# Patient Record
Sex: Female | Born: 1975 | Race: Black or African American | Hispanic: No | Marital: Married | Smoking: Never smoker
Health system: Southern US, Community
[De-identification: ages and names within clinical notes are randomized; demographics above are authoritative.]

## PROBLEM LIST (undated history)

## (undated) ENCOUNTER — Inpatient Hospital Stay (HOSPITAL_COMMUNITY): Payer: Self-pay

## (undated) DIAGNOSIS — D219 Benign neoplasm of connective and other soft tissue, unspecified: Secondary | ICD-10-CM

## (undated) DIAGNOSIS — R112 Nausea with vomiting, unspecified: Secondary | ICD-10-CM

## (undated) DIAGNOSIS — Z8781 Personal history of (healed) traumatic fracture: Secondary | ICD-10-CM

## (undated) DIAGNOSIS — R51 Headache: Secondary | ICD-10-CM

## (undated) DIAGNOSIS — C801 Malignant (primary) neoplasm, unspecified: Secondary | ICD-10-CM

## (undated) DIAGNOSIS — K649 Unspecified hemorrhoids: Secondary | ICD-10-CM

## (undated) DIAGNOSIS — Z9889 Other specified postprocedural states: Secondary | ICD-10-CM

## (undated) DIAGNOSIS — R519 Headache, unspecified: Secondary | ICD-10-CM

## (undated) DIAGNOSIS — J302 Other seasonal allergic rhinitis: Secondary | ICD-10-CM

## (undated) DIAGNOSIS — D649 Anemia, unspecified: Secondary | ICD-10-CM

## (undated) DIAGNOSIS — E559 Vitamin D deficiency, unspecified: Secondary | ICD-10-CM

## (undated) DIAGNOSIS — M719 Bursopathy, unspecified: Secondary | ICD-10-CM

## (undated) HISTORY — PX: MANDIBLE SURGERY: SHX707

## (undated) HISTORY — DX: Benign neoplasm of connective and other soft tissue, unspecified: D21.9

## (undated) HISTORY — DX: Unspecified hemorrhoids: K64.9

## (undated) HISTORY — DX: Other specified postprocedural states: Z98.890

## (undated) HISTORY — DX: Personal history of (healed) traumatic fracture: Z87.81

## (undated) HISTORY — PX: WRIST SURGERY: SHX841

## (undated) HISTORY — PX: ECTOPIC PREGNANCY SURGERY: SHX613

## (undated) HISTORY — PX: FRACTURE SURGERY: SHX138

## (undated) HISTORY — DX: Vitamin D deficiency, unspecified: E55.9

## (undated) HISTORY — DX: Other seasonal allergic rhinitis: J30.2

## (undated) HISTORY — PX: NASAL SINUS SURGERY: SHX719

---

## 2000-10-16 ENCOUNTER — Other Ambulatory Visit: Admission: RE | Admit: 2000-10-16 | Discharge: 2000-10-16 | Payer: Self-pay | Admitting: Obstetrics and Gynecology

## 2001-12-14 ENCOUNTER — Emergency Department (HOSPITAL_COMMUNITY): Admission: EM | Admit: 2001-12-14 | Discharge: 2001-12-14 | Payer: Self-pay | Admitting: Emergency Medicine

## 2003-03-08 ENCOUNTER — Ambulatory Visit (HOSPITAL_COMMUNITY): Admission: RE | Admit: 2003-03-08 | Discharge: 2003-03-08 | Payer: Self-pay | Admitting: Pulmonary Disease

## 2004-06-15 ENCOUNTER — Ambulatory Visit (HOSPITAL_COMMUNITY): Admission: RE | Admit: 2004-06-15 | Discharge: 2004-06-15 | Payer: Self-pay | Admitting: Pulmonary Disease

## 2004-10-16 ENCOUNTER — Ambulatory Visit: Payer: Self-pay | Admitting: Orthopedic Surgery

## 2007-03-03 ENCOUNTER — Other Ambulatory Visit: Admission: RE | Admit: 2007-03-03 | Discharge: 2007-03-03 | Payer: Self-pay | Admitting: Obstetrics and Gynecology

## 2008-04-05 ENCOUNTER — Other Ambulatory Visit: Admission: RE | Admit: 2008-04-05 | Discharge: 2008-04-05 | Payer: Self-pay | Admitting: Obstetrics and Gynecology

## 2008-04-14 ENCOUNTER — Ambulatory Visit (HOSPITAL_COMMUNITY): Admission: RE | Admit: 2008-04-14 | Discharge: 2008-04-14 | Payer: Self-pay | Admitting: Obstetrics & Gynecology

## 2009-06-06 ENCOUNTER — Other Ambulatory Visit
Admission: RE | Admit: 2009-06-06 | Discharge: 2009-06-06 | Payer: Self-pay | Source: Home / Self Care | Admitting: Obstetrics and Gynecology

## 2010-02-28 ENCOUNTER — Ambulatory Visit (HOSPITAL_COMMUNITY)
Admission: RE | Admit: 2010-02-28 | Discharge: 2010-02-28 | Payer: Self-pay | Source: Home / Self Care | Attending: Pulmonary Disease | Admitting: Pulmonary Disease

## 2010-04-19 ENCOUNTER — Encounter: Payer: Self-pay | Admitting: Orthopedic Surgery

## 2010-05-08 ENCOUNTER — Encounter: Payer: Self-pay | Admitting: Orthopedic Surgery

## 2010-05-10 ENCOUNTER — Ambulatory Visit: Payer: Self-pay | Admitting: Orthopedic Surgery

## 2010-06-14 ENCOUNTER — Ambulatory Visit: Payer: Self-pay | Admitting: Orthopedic Surgery

## 2010-06-27 ENCOUNTER — Encounter: Payer: Self-pay | Admitting: Orthopedic Surgery

## 2010-06-27 ENCOUNTER — Ambulatory Visit (INDEPENDENT_AMBULATORY_CARE_PROVIDER_SITE_OTHER): Payer: BC Managed Care – PPO | Admitting: Orthopedic Surgery

## 2010-06-27 VITALS — HR 72 | Resp 18 | Ht 63.0 in | Wt 164.0 lb

## 2010-06-27 DIAGNOSIS — M25519 Pain in unspecified shoulder: Secondary | ICD-10-CM

## 2010-06-27 DIAGNOSIS — M25511 Pain in right shoulder: Secondary | ICD-10-CM

## 2010-06-27 MED ORDER — IBUPROFEN 800 MG PO TABS: 800.0000 mg | ORAL_TABLET | Freq: Three times a day (TID) | ORAL | Status: AC | PRN

## 2010-06-27 NOTE — Progress Notes (Signed)
Bilateral shoulder pain.  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 Presents now with bilateral aching in the shoulders from time to time with sharp throbbing pain, which seems to be intermittent. Occasionally, associated with some numbness and tingling in the RIGHT upper extremity. No injuries reported,  There's no locking, catching noted. Seems to alternate between the RIGHT and LEFT shoulder. The pain is not related to the neck. The neck does not hurt when she is turning her flexing the neck. She does have pain when she moves the shoulder joint.  Medical systems review history weight gain, chest pain, cough, fatigue snoring, numbness, tingling, anxiety. All the systems reviewed were negative.  Social history is recorded again. She is a Runner, broadcasting/film/video. She doesn't smoke or drink. She does have a master's degree.  Her vital signs are as recorded  General: The patient is normally developed, with normal grooming and hygiene. There are no gross deformities. The body habitus is normal   CDV: The pulse and perfusion of the extremities are normal   LYMPH: There is no gross lymphadenopathy in the extremities   Skin: There are no rashes, ulcers or cafe-au-lait spot   Psyche: The patient is alert, awake and oriented.  Mood is normal   Neuro:  The coordination and balance are normal.  Sensation is normal. Reflexes are 2+ and equal   Musculoskeletal  Bilateral shoulders:   There appears to be no palpable tenderness or swelling along the shoulders. She does have full range of motion. Her shoulders are stable. Strength is normal and the rotator cuff. She has no instability.  Impingement signs are positive bilaterally. 150.  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  Recommend a home exercise program and ibuprofen.  X-rays of the cervical spine, complete series and  RIGHT shoulder were negative. They were done at the hospital.  Follow up as needed

## 2010-06-27 NOTE — Patient Instructions (Signed)
Perform exercises x 6 weeks

## 2011-01-15 DIAGNOSIS — C089 Malignant neoplasm of major salivary gland, unspecified: Secondary | ICD-10-CM

## 2011-01-15 HISTORY — DX: Malignant neoplasm of major salivary gland, unspecified: C08.9

## 2011-06-07 ENCOUNTER — Ambulatory Visit (HOSPITAL_COMMUNITY)
Admission: RE | Admit: 2011-06-07 | Discharge: 2011-06-07 | Disposition: A | Discharge status: Home / Self Care | Payer: BC Managed Care – PPO | Source: Ambulatory Visit | Attending: Pulmonary Disease | Admitting: Pulmonary Disease

## 2011-06-07 ENCOUNTER — Other Ambulatory Visit (HOSPITAL_COMMUNITY): Payer: Self-pay | Admitting: Pulmonary Disease

## 2011-06-07 DIAGNOSIS — R059 Cough, unspecified: Secondary | ICD-10-CM

## 2011-06-07 DIAGNOSIS — R05 Cough: Secondary | ICD-10-CM

## 2011-06-07 DIAGNOSIS — J9819 Other pulmonary collapse: Secondary | ICD-10-CM | POA: Insufficient documentation

## 2011-06-07 DIAGNOSIS — J90 Pleural effusion, not elsewhere classified: Secondary | ICD-10-CM | POA: Insufficient documentation

## 2011-06-07 DIAGNOSIS — I517 Cardiomegaly: Secondary | ICD-10-CM | POA: Insufficient documentation

## 2011-06-07 DIAGNOSIS — R0602 Shortness of breath: Secondary | ICD-10-CM | POA: Insufficient documentation

## 2011-06-26 ENCOUNTER — Ambulatory Visit (HOSPITAL_COMMUNITY)
Admission: RE | Admit: 2011-06-26 | Discharge: 2011-06-26 | Disposition: A | Discharge status: Home / Self Care | Payer: BC Managed Care – PPO | Source: Ambulatory Visit | Attending: Pulmonary Disease | Admitting: Pulmonary Disease

## 2011-06-26 ENCOUNTER — Other Ambulatory Visit (HOSPITAL_COMMUNITY): Payer: Self-pay | Admitting: Pulmonary Disease

## 2011-06-26 DIAGNOSIS — M25469 Effusion, unspecified knee: Secondary | ICD-10-CM | POA: Insufficient documentation

## 2011-06-26 DIAGNOSIS — J189 Pneumonia, unspecified organism: Secondary | ICD-10-CM

## 2011-10-02 ENCOUNTER — Other Ambulatory Visit: Payer: Self-pay | Admitting: Adult Health

## 2011-10-02 ENCOUNTER — Other Ambulatory Visit (HOSPITAL_COMMUNITY)
Admission: RE | Admit: 2011-10-02 | Discharge: 2011-10-02 | Disposition: A | Discharge status: Home / Self Care | Payer: BC Managed Care – PPO | Source: Ambulatory Visit | Attending: Obstetrics and Gynecology | Admitting: Obstetrics and Gynecology

## 2011-10-02 DIAGNOSIS — Z113 Encounter for screening for infections with a predominantly sexual mode of transmission: Secondary | ICD-10-CM | POA: Insufficient documentation

## 2011-10-02 DIAGNOSIS — Z01419 Encounter for gynecological examination (general) (routine) without abnormal findings: Secondary | ICD-10-CM | POA: Insufficient documentation

## 2011-10-02 DIAGNOSIS — Z1151 Encounter for screening for human papillomavirus (HPV): Secondary | ICD-10-CM | POA: Insufficient documentation

## 2011-10-02 LAB — OB RESULTS CONSOLE HEPATITIS B SURFACE ANTIGEN: Hepatitis B Surface Ag: NEGATIVE

## 2011-10-02 LAB — OB RESULTS CONSOLE ANTIBODY SCREEN: Antibody Screen: NEGATIVE

## 2011-10-02 LAB — OB RESULTS CONSOLE RPR: RPR: NONREACTIVE

## 2011-10-02 LAB — OB RESULTS CONSOLE HIV ANTIBODY (ROUTINE TESTING): HIV: NONREACTIVE

## 2011-10-02 LAB — OB RESULTS CONSOLE RUBELLA ANTIBODY, IGM: Rubella: IMMUNE

## 2011-10-13 ENCOUNTER — Inpatient Hospital Stay (HOSPITAL_COMMUNITY)
Admission: AD | Admit: 2011-10-13 | Discharge: 2011-10-13 | Disposition: A | Discharge status: Home / Self Care | Payer: BC Managed Care – PPO | Source: Ambulatory Visit | Attending: Family Medicine | Admitting: Family Medicine

## 2011-10-13 ENCOUNTER — Encounter (HOSPITAL_COMMUNITY): Payer: Self-pay | Admitting: *Deleted

## 2011-10-13 DIAGNOSIS — O209 Hemorrhage in early pregnancy, unspecified: Secondary | ICD-10-CM | POA: Insufficient documentation

## 2011-10-13 DIAGNOSIS — A599 Trichomoniasis, unspecified: Secondary | ICD-10-CM

## 2011-10-13 DIAGNOSIS — O98819 Other maternal infectious and parasitic diseases complicating pregnancy, unspecified trimester: Secondary | ICD-10-CM | POA: Insufficient documentation

## 2011-10-13 DIAGNOSIS — A5901 Trichomonal vulvovaginitis: Secondary | ICD-10-CM | POA: Insufficient documentation

## 2011-10-13 DIAGNOSIS — O4690 Antepartum hemorrhage, unspecified, unspecified trimester: Secondary | ICD-10-CM

## 2011-10-13 HISTORY — DX: Other specified postprocedural states: Z98.890

## 2011-10-13 HISTORY — DX: Other specified postprocedural states: R11.2

## 2011-10-13 LAB — ABO/RH: ABO/RH(D): O POS

## 2011-10-13 NOTE — MAU Note (Signed)
Pt reports having some vaginal bleeding this morning. Denies any pain or cramping at this time.  U/S in office this week. Pregnancy with Twins

## 2011-10-13 NOTE — MAU Provider Note (Signed)
History     CSN: [id number]  Arrival date and time: 10/13/11 1317   First Provider Initiated Contact with Patient 10/13/11 1349      Chief Complaint  Patient presents with  . Vaginal Bleeding   HPI Kara Gentry is a 36 y.o. female @ [redacted] weeks gestation with twins who presents to MAU with vaginal bleeding. The bleeding started this morning approximately 7:30 am. She describes the bleeding as lighter than a period and bright red. Has had episodes of spotting earlier in the pernancy but today was more. Denies abdominal cramping. Last sexual intercourse 2 months ago. Last pap smear 2 weeks ago and was normal. Had pap smear and cultures done 2 weeks ago and was given Rx for flagyl for trichomonas but has not gotten medication. Prenatal care with Dr. Emelda Fear. Last office visit was this past week. Had blood work and ultrasound and everything was fine.   OB History    Grav Para Term Preterm Abortions TAB SAB Ect Mult Living   3 0   2  2   0      Past Medical History  Diagnosis Date  . Seasonal allergies   . No pertinent past medical history   . PONV (postoperative nausea and vomiting)     Past Surgical History  Procedure Date  . Ectopic pregnancy surgery   . Wrist surgery   . Mandible surgery cancer  . Nasal sinus surgery     Family History  Problem Relation Age of Onset  . Arthritis    . Cancer    . Diabetes      History  Substance Use Topics  . Smoking status: Never Smoker   . Smokeless tobacco: Not on file  . Alcohol Use: No    Allergies: No Known Allergies  Prescriptions prior to admission  Medication Sig Dispense Refill  . ibuprofen (ADVIL,MOTRIN) 200 MG tablet Take 200 mg by mouth as needed.        Marland Kitchen VITAMIN D, CHOLECALCIFEROL, PO Take by mouth.          Review of Systems  Constitutional: Negative for fever, chills and weight loss.  HENT: Negative for ear pain, nosebleeds, congestion, sore throat and neck pain.   Eyes: Negative for blurred vision, double  vision, photophobia and pain.  Respiratory: Negative for cough, shortness of breath and wheezing.   Cardiovascular: Negative for chest pain, palpitations and leg swelling.  Gastrointestinal: Positive for heartburn and nausea. Negative for vomiting, abdominal pain, diarrhea and constipation.  Genitourinary: Positive for frequency. Negative for dysuria and urgency.  Musculoskeletal: Negative for myalgias and back pain.  Skin: Negative for itching and rash.  Neurological: Positive for headaches. Negative for dizziness, sensory change, speech change, seizures and weakness.  Endo/Heme/Allergies: Does not bruise/bleed easily.  Psychiatric/Behavioral: Negative for depression. The patient is not nervous/anxious.    Physical Exam   Blood pressure 105/66, pulse 76, temperature 98 F (36.7 C), temperature source Oral, resp. rate 18, height 5\' 2"  (1.575 m), weight 160 lb 3.2 oz (72.666 kg), last menstrual period 06/17/2011.  Physical Exam  Nursing note and vitals reviewed. Constitutional: She is oriented to person, place, and time. She appears well-developed and well-nourished. No distress.  HENT:  Head: Normocephalic and atraumatic.  Eyes: EOM are normal.  Neck: Neck supple.  Cardiovascular: Normal rate.   Respiratory: Effort normal.  GI: Soft. There is no tenderness.  Genitourinary:       External genitalia without lesions. Frothy bloody discharge vaginal vault.  Cervix long, closed, inflamed, no CMT. Uterus approximately 14 week size.  Musculoskeletal: Normal range of motion.  Neurological: She is alert and oriented to person, place, and time.  Skin: Skin is warm and dry.  Psychiatric: She has a normal mood and affect. Her behavior is normal. Judgment and thought content normal.    MAU Course: discussed with Dr. Shawnie Pons  Procedures: informal bedside ultrasound, patient and husband able to visualize both babies and heart beats.   Assessment: Bleeding in pregnancy   Trichomonas from previous  office visit that patient has not taken medication yet  Plan:  Encouraged patient to take the antibiotic   Discussed bleeding in pregnancy, infection and other causes   Return to the office for follow up   Return here as needed  Kari, Montero  Home Medication Instructions [id number]   Printed on:10/13/11 1420  Medication Information                    Prenatal Vit-Fe Fumarate-FA (PRENATAL MULTIVITAMIN) TABS Take 1 tablet by mouth every morning.           ondansetron (ZOFRAN-ODT) 8 MG disintegrating tablet Take 8 mg by mouth every 8 (eight) hours as needed. Takes for nausea           acetaminophen (TYLENOL) 500 MG tablet Take 500 mg by mouth every 6 (six) hours as needed. Takes for headache            Follow-up Information    Follow up with Tilda Burrow, MD.   Contact information:   [facility] [address] [place] [address] [phone]         Results for orders placed during the hospital encounter of 10/13/11 (from the past 24 hour(s))  ABO/RH     Status: Normal   Collection Time   10/13/11  2:27 PM      Component Value Range   ABO/RH(D) [place] POS     NEESE,HOPE, RN, FNP, BC 10/13/2011, 1:50 PM

## 2011-10-14 NOTE — MAU Provider Note (Signed)
Chart reviewed and agree with management and plan.  

## 2011-11-27 ENCOUNTER — Other Ambulatory Visit: Payer: Self-pay

## 2011-12-16 ENCOUNTER — Encounter (HOSPITAL_COMMUNITY): Payer: Self-pay | Admitting: Emergency Medicine

## 2011-12-16 ENCOUNTER — Inpatient Hospital Stay (HOSPITAL_COMMUNITY): Payer: BC Managed Care – PPO

## 2011-12-16 ENCOUNTER — Emergency Department (HOSPITAL_COMMUNITY)
Admission: EM | Admit: 2011-12-16 | Discharge: 2011-12-16 | Disposition: A | Discharge status: Home / Self Care | Payer: BC Managed Care – PPO | Source: Home / Self Care | Attending: Emergency Medicine | Admitting: Emergency Medicine

## 2011-12-16 ENCOUNTER — Inpatient Hospital Stay (HOSPITAL_COMMUNITY)
Admission: AD | Admit: 2011-12-16 | Discharge: 2011-12-25 | DRG: 651 | Disposition: A | Discharge status: Home / Self Care | Payer: BC Managed Care – PPO | Source: Ambulatory Visit | Attending: Obstetrics & Gynecology | Admitting: Obstetrics & Gynecology

## 2011-12-16 ENCOUNTER — Encounter (HOSPITAL_COMMUNITY): Payer: Self-pay | Admitting: *Deleted

## 2011-12-16 DIAGNOSIS — O4290 Premature rupture of membranes, unspecified as to length of time between rupture and onset of labor, unspecified weeks of gestation: Principal | ICD-10-CM | POA: Diagnosis present

## 2011-12-16 DIAGNOSIS — O3430 Maternal care for cervical incompetence, unspecified trimester: Secondary | ICD-10-CM | POA: Diagnosis present

## 2011-12-16 DIAGNOSIS — Z98891 History of uterine scar from previous surgery: Secondary | ICD-10-CM

## 2011-12-16 DIAGNOSIS — J301 Allergic rhinitis due to pollen: Secondary | ICD-10-CM | POA: Insufficient documentation

## 2011-12-16 DIAGNOSIS — R109 Unspecified abdominal pain: Secondary | ICD-10-CM | POA: Insufficient documentation

## 2011-12-16 DIAGNOSIS — O30049 Twin pregnancy, dichorionic/diamniotic, unspecified trimester: Secondary | ICD-10-CM

## 2011-12-16 DIAGNOSIS — O30009 Twin pregnancy, unspecified number of placenta and unspecified number of amniotic sacs, unspecified trimester: Secondary | ICD-10-CM

## 2011-12-16 DIAGNOSIS — Z331 Pregnant state, incidental: Secondary | ICD-10-CM | POA: Insufficient documentation

## 2011-12-16 DIAGNOSIS — O09529 Supervision of elderly multigravida, unspecified trimester: Secondary | ICD-10-CM | POA: Diagnosis present

## 2011-12-16 DIAGNOSIS — N719 Inflammatory disease of uterus, unspecified: Secondary | ICD-10-CM

## 2011-12-16 DIAGNOSIS — N883 Incompetence of cervix uteri: Secondary | ICD-10-CM

## 2011-12-16 DIAGNOSIS — IMO0001 Reserved for inherently not codable concepts without codable children: Secondary | ICD-10-CM

## 2011-12-16 DIAGNOSIS — IMO0002 Reserved for concepts with insufficient information to code with codable children: Secondary | ICD-10-CM

## 2011-12-16 DIAGNOSIS — Z3490 Encounter for supervision of normal pregnancy, unspecified, unspecified trimester: Secondary | ICD-10-CM

## 2011-12-16 LAB — URINALYSIS, ROUTINE W REFLEX MICROSCOPIC
Bilirubin Urine: NEGATIVE
Bilirubin Urine: NEGATIVE
Glucose, UA: NEGATIVE mg/dL
Glucose, UA: NEGATIVE mg/dL
Hgb urine dipstick: NEGATIVE
Ketones, ur: NEGATIVE mg/dL
Ketones, ur: 15 mg/dL — AB
Leukocytes, UA: NEGATIVE
Leukocytes, UA: NEGATIVE
Nitrite: NEGATIVE
Nitrite: NEGATIVE
Protein, ur: NEGATIVE mg/dL
Protein, ur: NEGATIVE mg/dL
Specific Gravity, Urine: <1.005 — ABNORMAL LOW (ref 1.005–1.030)
Specific Gravity, Urine: 1.01 (ref 1.005–1.030)
Urobilinogen, UA: 0.2 mg/dL (ref 0.0–1.0)
Urobilinogen, UA: 0.2 mg/dL (ref 0.0–1.0)
pH: 6 (ref 5.0–8.0)
pH: 7 (ref 5.0–8.0)

## 2011-12-16 LAB — CBC WITH DIFFERENTIAL/PLATELET
Basophils Absolute: 0 10*3/uL (ref 0.0–0.1)
Basophils Relative: 0 % (ref 0–1)
Eosinophils Absolute: 0 10*3/uL (ref 0.0–0.7)
Eosinophils Relative: 0 % (ref 0–5)
HCT: 32.2 % — ABNORMAL LOW (ref 36.0–46.0)
Hemoglobin: 10.8 g/dL — ABNORMAL LOW (ref 12.0–15.0)
Lymphocytes Relative: 12 % (ref 12–46)
Lymphs Abs: 1.4 10*3/uL (ref 0.7–4.0)
MCH: 28.6 pg (ref 26.0–34.0)
MCHC: 33.5 g/dL (ref 30.0–36.0)
MCV: 85.4 fL (ref 78.0–100.0)
Monocytes Absolute: 0.8 10*3/uL (ref 0.1–1.0)
Monocytes Relative: 7 % (ref 3–12)
Neutro Abs: 9.1 10*3/uL — ABNORMAL HIGH (ref 1.7–7.7)
Neutrophils Relative %: 80 % — ABNORMAL HIGH (ref 43–77)
Platelets: 254 10*3/uL (ref 150–400)
RBC: 3.77 MIL/uL — ABNORMAL LOW (ref 3.87–5.11)
RDW: 14.8 % (ref 11.5–15.5)
WBC: 11.3 10*3/uL — ABNORMAL HIGH (ref 4.0–10.5)

## 2011-12-16 LAB — WET PREP, GENITAL
Clue Cells Wet Prep HPF POC: NONE SEEN
Trich, Wet Prep: NONE SEEN
Yeast Wet Prep HPF POC: NONE SEEN

## 2011-12-16 LAB — URINE MICROSCOPIC-ADD ON

## 2011-12-16 MED ORDER — CALCIUM CARBONATE ANTACID 500 MG PO CHEW
2.0000 | CHEWABLE_TABLET | ORAL | Status: DC | PRN
  Filled 2011-12-16: qty 2

## 2011-12-16 MED ORDER — ZOLPIDEM TARTRATE 5 MG PO TABS
5.0000 mg | ORAL_TABLET | Freq: Every evening | ORAL | Status: DC | PRN
  Administered 2011-12-17 – 2011-12-22 (×6): 5 mg via ORAL
  Filled 2011-12-16 (×6): qty 1

## 2011-12-16 MED ORDER — SODIUM CHLORIDE 0.9 % IV SOLN
250.0000 mg | Freq: Four times a day (QID) | INTRAVENOUS | Status: AC
  Administered 2011-12-16 – 2011-12-18 (×8): 250 mg via INTRAVENOUS
  Filled 2011-12-16 (×8): qty 250

## 2011-12-16 MED ORDER — AMOXICILLIN 500 MG PO CAPS
500.0000 mg | ORAL_CAPSULE | Freq: Three times a day (TID) | ORAL | Status: DC
  Administered 2011-12-18 – 2011-12-22 (×11): 500 mg via ORAL
  Filled 2011-12-16 (×12): qty 1

## 2011-12-16 MED ORDER — PRENATAL MULTIVITAMIN CH
1.0000 | ORAL_TABLET | Freq: Every day | ORAL | Status: DC
  Administered 2011-12-17 – 2011-12-21 (×5): 1 via ORAL
  Filled 2011-12-16 (×9): qty 1

## 2011-12-16 MED ORDER — SODIUM CHLORIDE 0.9 % IV SOLN
2.0000 g | Freq: Four times a day (QID) | INTRAVENOUS | Status: AC
  Administered 2011-12-16 – 2011-12-18 (×8): 2 g via INTRAVENOUS
  Filled 2011-12-16 (×8): qty 2000

## 2011-12-16 MED ORDER — ACETAMINOPHEN 325 MG PO TABS: 650.0000 mg | ORAL_TABLET | ORAL | Status: DC | PRN

## 2011-12-16 MED ORDER — DEXTROSE IN LACTATED RINGERS 5 % IV SOLN
INTRAVENOUS | Status: DC
  Administered 2011-12-16 – 2011-12-21 (×6): via INTRAVENOUS

## 2011-12-16 MED ORDER — DOCUSATE SODIUM 100 MG PO CAPS
100.0000 mg | ORAL_CAPSULE | Freq: Every day | ORAL | Status: DC
  Administered 2011-12-17: 100 mg via ORAL
  Filled 2011-12-16 (×4): qty 1

## 2011-12-16 MED ORDER — ERYTHROMYCIN BASE 250 MG PO TABS
250.0000 mg | ORAL_TABLET | Freq: Four times a day (QID) | ORAL | Status: DC
  Administered 2011-12-18 – 2011-12-22 (×15): 250 mg via ORAL
  Filled 2011-12-16 (×16): qty 1

## 2011-12-16 NOTE — MAU Note (Signed)
Pt woke up this morning and felt something in her vagina.  She then went to [facility], who saw nothing upon exam, and discharged her.  After returning home, pt went to use restroom and again felt something coming out of her vagina.  Her husband could see the protruding object and took a picture and then brought her to MAU.  Pt has been having some pink spotting as well today.

## 2011-12-16 NOTE — Progress Notes (Signed)
Patient states she is feeling flutters in her vagina. No additional bleeding noted. No leaking of fluid. JML

## 2011-12-16 NOTE — Progress Notes (Signed)
Spoke with Reuel Boom at [facility] ED, pt in no pain at this time, no discharge or leaking of fluid noted.  No bleeding.  Pt reports good fetal movement. FHR #1 left lower abd, 150s, FHR #2 near umbilicus, 140s. Roney Marion, CNM states ok to discharge patient.

## 2011-12-16 NOTE — ED Provider Notes (Addendum)
History     CSN: [id number]  Arrival date & time 12/16/11  0125   First MD Initiated Contact with Patient 12/16/11 0129      Chief Complaint  Patient presents with  . Abdominal Pain     Patient is a 36 y.o. female presenting with abdominal pain. The history is provided by the patient.  Abdominal Pain The primary symptoms of the illness include abdominal pain and vaginal discharge. The primary symptoms of the illness do not include fever, nausea, vomiting, diarrhea, dysuria or vaginal bleeding. Episode onset: just prior to arrival. The onset of the illness was gradual. The problem has been gradually worsening.  The vaginal discharge is not associated with dysuria.  Symptoms associated with the illness do not include chills.  Pt reports she was having a bowel movement and felt pain in her lower abdomen and saw a "bubble pop out" of her vagina which improved immediately.  She reports her pain continues.  She reports intermittent vag discharge all day today.  No vag bleeding.  No contractions.  No back pain.  No falls or trauma reported  She is pregnant with twins.  She is 22 weeks 1 day She has not had any complications thus far  Past Medical History  Diagnosis Date  . Seasonal allergies   . No pertinent past medical history   . PONV (postoperative nausea and vomiting)     Past Surgical History  Procedure Date  . Ectopic pregnancy surgery   . Wrist surgery   . Mandible surgery cancer  . Nasal sinus surgery     Family History  Problem Relation Age of Onset  . Arthritis    . Cancer    . Diabetes      History  Substance Use Topics  . Smoking status: Never Smoker   . Smokeless tobacco: Not on file  . Alcohol Use: No    OB History    Grav Para Term Preterm Abortions TAB SAB Ect Mult Living   3 0   2  2   0      Review of Systems  Constitutional: Negative for fever and chills.  Gastrointestinal: Positive for abdominal pain. Negative for nausea, vomiting and  diarrhea.  Genitourinary: Positive for vaginal discharge. Negative for dysuria and vaginal bleeding.  All other systems reviewed and are negative.    Allergies  Review of patient's allergies indicates no known allergies.  Home Medications   Current Outpatient Rx  Name Route Sig Dispense Refill  . ACETAMINOPHEN 500 MG PO TABS Oral Take 500 mg by mouth every 6 (six) hours as needed. Takes for headache    . ONDANSETRON 8 MG PO TBDP Oral Take 8 mg by mouth every 8 (eight) hours as needed. Takes for nausea    . PRENATAL MULTIVITAMIN CH Oral Take 1 tablet by mouth every morning.      BP 129/81  Pulse 92  Temp 98.8 F (37.1 C) (Oral)  Resp 16  Ht 5\' 2"  (1.575 m)  Wt 160 lb (72.576 kg)  BMI 29.26 kg/m2  SpO2 99%  LMP 06/17/2011  Physical Exam CONSTITUTIONAL: Well developed/well nourished HEAD AND FACE: Normocephalic/atraumatic EYES: EOMI/PERRL ENMT: Mucous membranes moist NECK: supple no meningeal signs SPINE:entire spine nontender CV: S1/S2 noted, no murmurs/rubs/gallops noted LUNGS: Lungs are clear to auscultation bilaterally, no apparent distress ABDOMEN: soft, nontender, no rebound or guarding. gravid GU:no cva tenderness.  Sterile glove exam.  No vag bleeding. Small amt of discharge. Cervix is not definitively  identified.  No products of conception are noted. No cystocele noted  Chaperone present NEURO: Pt is awake/alert, moves all extremitiesx4 EXTREMITIES: pulses normal, full ROM SKIN: warm, color normal PSYCH: no abnormalities of mood noted    ED Course  Procedures 1:54 AM Pt here with mild abdominal pain and "saw a bubble" in her vagina after a bowel movement.  Fetal monitoring in progress and will call OB for further guidance 2:11 AM [facility]  We discussed her exam findings.  We discussed her history and we discussed that she is pregnant with twins at 22 weeks.  He feels she does not need transfer/monitoring at this time. He feels  she is  safe for discharge.  Pt is well appearing, no distress.  Will send urine for culture (pt denies dysuria, no fever/vomiting) will not empirically treat I doubt other acute abdominal process at this time as she is well appearing, no distress/nontoxic  MDM  Nursing notes including past medical history and social history reviewed and considered in documentation Labs/vital reviewed and considered         Joya Gaskins, MD 12/16/11 0245  Joya Gaskins, MD 12/16/11 [phone]

## 2011-12-16 NOTE — ED Notes (Signed)
Pt discharged. Pt stable at time of discharge. pt has no questions regarding discharge at this time. Pt voiced understanding of discharge instructions.  

## 2011-12-16 NOTE — Consult Note (Signed)
Asked by Dr. Macon Large to provide prenatal consultation for 36 yo G3 P0 mother with twins who has probable incompetent cervix, admitted today at 3 1/7 wks after presenting with bulging BOW, 4 cm dilated per [place]. Pregnancy prevously uncomplicated (other than multiple gestation).  She is in Trendelenberg position and has been started on ampicillin.  Presented limit of viability as [redacted] wks EGA, with no reasonable expectation for survival prior to that due to multisystem immaturity, skin immaturity, and size, while after 23 wks there would be some small chance for survival without major handicap. I emphasized that intervention < 24 wks was not always appropriate and that the decision to intervene could be based on parents' wishes, preferably with their understanding the potential adverse outcomes at this stage of borderline viability.  I suggested that she and FOB consider at what EGA they would want full-scale fetal intervention, and to discuss this with OB.    Suggest beginning BMZ 2 days prior to date at which intervention (possible C/section for fetal indications, NICU attendance at delivery) is desired.  Patient was attentive, had few questions, and was appreciative of my input.  Thank you for the consultation.  Add:  Suggest repeat NICU consult if pregnancy continues to viability.

## 2011-12-16 NOTE — MAU Note (Signed)
Pt reports she saw a sac/bubble protruding from her vagina. Went to [facility] and they did not see it told her to go home and rest.  Happened again this afternoon and come to MAU. NO obvious sign of bulging membranes. F. Cres-Dishmon,CNM at bedside. U/S ordered no speculum or vag exam done . Pt placed in trendelenburg.

## 2011-12-16 NOTE — ED Notes (Signed)
Patient states she is [redacted] weeks pregnant with twins and went to the bathroom approximately 15 minutes ago and had a bowel movement. Patient states she "felt like a bubble came out of vagina". Also complains of clear white discharge. Now complaining of lower abdominal pain.

## 2011-12-16 NOTE — H&P (Signed)
Kara Gentry is a 36 y.o. G3P0020 at [redacted]w[redacted]d admitted for probably incompetent cervix.   0 Fetal presentation is cephalic.baby A; Baby B transverse  History of Present Illness: Pt was seen at [facility] last night with c/o "bubble" coming out of vagina.  Please see notes from that visit for details Patient reports the fetal movement as active. Patient reports uterine contraction  activity as "Crampy" Patient reports  vaginal bleeding as scant staining. Patient describes fluid per vagina as None.  There is no problem list on file for this patient.  Past Medical History: Past Medical History  Diagnosis Date  . Seasonal allergies   . No pertinent past medical history   . PONV (postoperative nausea and vomiting)     Past Surgical History: Past Surgical History  Procedure Date  . Ectopic pregnancy surgery   . Wrist surgery   . Mandible surgery cancer  . Nasal sinus surgery     Obstetrical History: OB History    Grav Para Term Preterm Abortions TAB SAB Ect Mult Living   3 0   2  2   0       Social History: History   Social History  . Marital Status: Married    Spouse Name: N/A    Number of Children: N/A  . Years of Education: masters   Occupational History  . teacher    Social History Main Topics  . Smoking status: Never Smoker   . Smokeless tobacco: None  . Alcohol Use: No  . Drug Use: No  . Sexually Active: Yes    Birth Control/ Protection: None   Other Topics Concern  . None   Social History Narrative  . None    Family History: Family History  Problem Relation Age of Onset  . Arthritis    . Cancer    . Diabetes      Allergies: No Known Allergies  Prescriptions prior to admission  Medication Sig Dispense Refill  . acetaminophen (TYLENOL) 500 MG tablet Take 500 mg by mouth every 6 (six) hours as needed. Takes for headache      . ondansetron (ZOFRAN) 8 MG tablet Take 8 mg by mouth every 8 (eight) hours as needed. For nausea      . Prenatal  Vit-Fe Fumarate-FA (PRENATAL MULTIVITAMIN) TABS Take 1 tablet by mouth every morning.        Review of Systems - Negative except above mentioned.  "bubble" no longer visible.    Vitals:  Blood pressure 102/67, pulse 79, temperature 98.5 F (36.9 C), temperature source Oral, resp. rate 18, last menstrual period 06/17/2011. Physical Examination:  General appearance - alert, well appearing, and in no distress, oriented to person, place, and time and anxious Mental status - alert, oriented to person, place, and time Chest - clear to auscultation, no wheezes, rales or rhonchi, symmetric air entry Heart - normal rate and regular rhythm Abdomen - soft, nontender, nondistended, no masses or organomegaly Pelvic - Deferred:  Pt has a picture taken earlier that clearly depicts membranes visible at introitus Extremities - peripheral pulses normal, no pedal edema, no clubbing or cyanosis Abdomen: gravid and non-tender and fundal height  is size equals dates and consistent with twins Cervix: per u/s no length and 4cms dilated with membranes protruding in vagina.  Umbilical cord of baby A in protruding membranes . Extremities: extremities normal, atraumatic, no cyanosis or edema with DTRs 2+ bilaterally Membranes:intact Fetal Monitoring:+ FCA both fetuses   Labs:  Recent Results (from the past 24 hour(s))  URINALYSIS, ROUTINE W REFLEX MICROSCOPIC   Collection Time   12/16/11  2:05 AM      Component Value Range   Color, Urine STRAW (*) YELLOW   APPearance HAZY (*) CLEAR   Specific Gravity, Urine <1.005 (*) 1.005 - 1.030   pH 6.0  5.0 - 8.0   Glucose, UA NEGATIVE  NEGATIVE mg/dL   Hgb urine dipstick LARGE (*) NEGATIVE   Bilirubin Urine NEGATIVE  NEGATIVE   Ketones, ur NEGATIVE  NEGATIVE mg/dL   Protein, ur NEGATIVE  NEGATIVE mg/dL   Urobilinogen, UA 0.2  0.0 - 1.0 mg/dL   Nitrite NEGATIVE  NEGATIVE   Leukocytes, UA NEGATIVE  NEGATIVE  URINE MICROSCOPIC-ADD ON   Collection Time   12/16/11   2:05 AM      Component Value Range   Squamous Epithelial / LPF MANY (*) RARE   WBC, UA 0-2  <3 WBC/hpf   RBC / HPF 0-2  <3 RBC/hpf   Bacteria, UA FEW (*) RARE  CBC WITH DIFFERENTIAL   Collection Time   12/16/11  5:28 PM      Component Value Range   WBC 11.3 (*) 4.0 - 10.5 K/uL   RBC 3.77 (*) 3.87 - 5.11 MIL/uL   Hemoglobin 10.8 (*) 12.0 - 15.0 g/dL   HCT 46.9 (*) 62.9 - 52.8 %   MCV 85.4  78.0 - 100.0 fL   MCH 28.6  26.0 - 34.0 pg   MCHC 33.5  30.0 - 36.0 g/dL   RDW 41.3  24.4 - 01.0 %   Platelets 254  150 - 400 K/uL   Neutrophils Relative 80 (*) 43 - 77 %   Neutro Abs 9.1 (*) 1.7 - 7.7 K/uL   Lymphocytes Relative 12  12 - 46 %   Lymphs Abs 1.4  0.7 - 4.0 K/uL   Monocytes Relative 7  3 - 12 %   Monocytes Absolute 0.8  0.1 - 1.0 K/uL   Eosinophils Relative 0  0 - 5 %   Eosinophils Absolute 0.0  0.0 - 0.7 K/uL   Basophils Relative 0  0 - 1 %   Basophils Absolute 0.0  0.0 - 0.1 K/uL  WET PREP, GENITAL   Collection Time   12/16/11  5:35 PM      Component Value Range   Yeast Wet Prep HPF POC NONE SEEN  NONE SEEN   Trich, Wet Prep NONE SEEN  NONE SEEN   Clue Cells Wet Prep HPF POC NONE SEEN  NONE SEEN   WBC, Wet Prep HPF POC FEW (*) NONE SEEN    Imaging Studies: Not available at this time for transfer to H&P    . ampicillin (OMNIPEN) IV  2 g Intravenous Q6H   Followed by  . amoxicillin  500 mg Oral Q8H  . docusate sodium  100 mg Oral Daily  . erythromycin  250 mg Intravenous Q6H   Followed by  . erythromycin  250 mg Oral Q6H  . prenatal multivitamin  1 tablet Oral Daily   I have reviewed the patient's current medications.   ASSESSMENT: Probable incompetent cervix:  100%effaced, 4cms; BOW in vagina with umbilical cord presenting Twins Discussed with Dr. Macon Large; will observe, trendelndburg, antibiotics

## 2011-12-16 NOTE — ED Notes (Signed)
Consulting civil engineer at [employer] talking with Consulting civil engineer at [employer]

## 2011-12-16 NOTE — ED Notes (Signed)
Patient heart rate mid to high 70's

## 2011-12-16 NOTE — H&P (Signed)
Attestation of Attending Supervision of Advanced Practitioner (CNM/NP): Evaluation and management procedures were performed by the Advanced Practitioner under my supervision and collaboration.  I have reviewed the Advanced Practitioner's note and chart, and I agree with the management and plan.  UGONNA  ANYANWU, MD, FACOG Attending Obstetrician & Gynecologist Faculty Practice, [facility]  

## 2011-12-17 LAB — FIBRINOGEN: Fibrinogen: 565 mg/dL — ABNORMAL HIGH (ref 204–475)

## 2011-12-17 LAB — URINE CULTURE: Colony Count: 10000

## 2011-12-17 LAB — APTT: aPTT: 30 seconds (ref 24–37)

## 2011-12-17 LAB — PROTIME-INR
INR: 1.03 (ref 0.00–1.49)
Prothrombin Time: 13.4 seconds (ref 11.6–15.2)

## 2011-12-17 LAB — PREPARE RBC (CROSSMATCH)

## 2011-12-17 MED ORDER — DOCUSATE SODIUM 100 MG PO CAPS
100.0000 mg | ORAL_CAPSULE | Freq: Every evening | ORAL | Status: DC | PRN
  Administered 2011-12-18: 100 mg via ORAL
  Filled 2011-12-17: qty 1

## 2011-12-17 MED ORDER — POLYETHYLENE GLYCOL 3350 17 G PO PACK
17.0000 g | PACK | Freq: Every day | ORAL | Status: DC | PRN
  Filled 2011-12-17: qty 1

## 2011-12-17 MED ORDER — OXYCODONE HCL 5 MG PO TABS
5.0000 mg | ORAL_TABLET | ORAL | Status: DC | PRN
  Administered 2011-12-17: 5 mg via ORAL
  Filled 2011-12-17: qty 1

## 2011-12-17 MED ORDER — FAMOTIDINE 20 MG PO TABS
20.0000 mg | ORAL_TABLET | Freq: Two times a day (BID) | ORAL | Status: DC
  Administered 2011-12-17 – 2011-12-21 (×9): 20 mg via ORAL
  Filled 2011-12-17 (×9): qty 1

## 2011-12-17 MED ORDER — DOCUSATE SODIUM 100 MG PO CAPS
100.0000 mg | ORAL_CAPSULE | Freq: Every day | ORAL | Status: DC
  Administered 2011-12-17 – 2011-12-21 (×5): 100 mg via ORAL
  Filled 2011-12-17 (×5): qty 1

## 2011-12-17 NOTE — Progress Notes (Signed)
Chaplain at bedside for spiritual consultation. 

## 2011-12-17 NOTE — Progress Notes (Signed)
12/17/11 1500  Clinical Encounter Type  Visited With Patient (Four close girlfriends who've known each other since childho)  Visit Type Initial;Spiritual support;Social support  Referral From Nurse  Recommendations Spiritual Care will follow for support.  Spiritual Encounters  Spiritual Needs Emotional;Prayer  Stress Factors  Patient Stress Factors Loss of control;Major life changes (Hx losses/grief)  Family Stress Factors Loss of control;Major life changes (Hx losses/grief)    Visited with Kara Gentry to offer spiritual and emotional support in this emotionally overwhelming period of waiting.  She was tearful, welcoming of pastoral presence, and requested prayer.  Provided pastoral listening, prayer at bedside, introduction to chaplain services, and affirmation of powerful friendships and family support.  Kara Gentry encouraged me to "come back any time" and is aware of ongoing chaplain availability, including overnight in case of emergency, and we plan for me to follow up for further support tomorrow.   [address] [place], [place] [phone]

## 2011-12-17 NOTE — Progress Notes (Signed)
Chaplain at bedside for spiritual consultation.

## 2011-12-17 NOTE — Progress Notes (Signed)
Kara Gentry is a 36 y.o. G3P0020 at [redacted]w[redacted]d with incompetent cervix, TIUP wanting expectant management.  Subjective: Passed one 4 cm clot, not LOF, no ctx, +FM  Objective: BP 104/58  Pulse 112  Temp 98.4 F (36.9 C) (Oral)  Resp 18  Ht 5\' 2"  (1.575 m)  Wt 72.576 kg (160 lb)  BMI 29.26 kg/m2  LMP 06/17/2011 I/O last 3 completed shifts: In: -  Out: 825 [Urine:825] Total I/O In: -  Out: 300 [Urine:300]  UC:  None palpated Vaginal exa:  No bulging bag, no heavy bleeding, cervix less dilated and presenting part is high. Labs: Lab Results  Component Value Date   WBC 11.3* 12/16/2011   HGB 10.8* 12/16/2011   HCT 32.2* 12/16/2011   MCV 85.4 12/16/2011   PLT 254 12/16/2011    Assessment / Plan: Bedrest for cervical incompetence with advanced dilation Bleeding probably an abruption / marginal separation. DIC panel Type and cross 2 units. Expectant management per patient request.  Alpha Mysliwiec H. 12/17/2011, 10:34 AM

## 2011-12-17 NOTE — Progress Notes (Signed)
Patient ID: Kara Gentry, female   DOB: 1975/05/29, 36 y.o.   MRN: [id number]  S:  Called to room as patient and spouse had several questions. Worried about fluid leak, constipation and prognosis. Pt c/o of pain around supper abdomen in ring - starting at epigastric area and all the way around. Mild. No shortness of breath.   O:  Filed Vitals:   12/17/11 2000  BP: 91/54  Pulse: 103  Temp:   Resp:    Temp 99.1  Abdomen non-tender. Lungs CTAB.   A/P: 36 y.o. G3P0020 at [redacted]w[redacted]d with  PTL/cervical dilation and PPROM - expectant management. Discussed prognosis at 22 weeks with patient and spouse and they understand poor prognosis if delivery this week. Also discussed fever/infection, bleeding that could compromise maternal wellbeing as reasons for intervention regardless of fetal age. They understood and desire to watch and wait for now.  Napoleon Form, MD

## 2011-12-17 NOTE — Progress Notes (Signed)
Patient ID: Kara Gentry, female   DOB: [birth year], 36 y.o.   MRN: [id number]  S:  Per RN, patient is leaking amniotic fluid. Pt denies contractions or pain. Minimal bleeding.   O:   Filed Vitals:   12/17/11 1200 12/17/11 1202 12/17/11 1406 12/17/11 1608  BP:  102/88 109/46 145/126  Pulse:  102 102 104  Temp: 98.4 F (36.9 C)   98 F (36.7 C)  TempSrc: Oral   Oral  Resp:  18 18 16   Height:      Weight:       Sterile Spec Exam:  Membranes bulging into vagina, unable to visualize cervix. Introitus and vagina are wet. Cannot see pooling around membranes. However, swab and slide made to look for ferning. Small (quarter size) clot seen with removal of speculum.  Fern Positive.  A/P 36 y.o. G3P0020 at [redacted]w[redacted]d with bulging membranes, cervical dilation, leaking amniotic fluid. Pre-viable. Pt does not wish to provoke labor. Expectant management, on amp and erythro If chorio,will proceed to delivery  Napoleon Form, MD

## 2011-12-17 NOTE — Progress Notes (Signed)
Faculty Practice OB/GYN Attending Note  Subjective:  Called to evaluate patient with with increased vaginal bleeding.  FHR reassuring x 2.Admitted for concern advanced cervical dilation and concern about impending PTD.   Objective:  Blood pressure 112/66, pulse 104, temperature 98.6 F (37 C), temperature source Oral, resp. rate 18, height 5\' 2"  (1.575 m), weight 72.576 kg (160 lb), last menstrual period 06/17/2011. Gen: NAD Lungs: CTAB Abdomen: NT gravid fundus, soft Cervix: Small amount of clots in vagina, BBOW palpated in vagina with fetal parts inside bag, cervix dilated to 5-6 cm Ext: 2+ DTRs, no edema, no cyanosis, negative Homan's sign  Assessment & Plan:  36 y.o. G3P0020 at [redacted]w[redacted]d with di/di twins admitted for advanced cervical dilation and BBOW in vagina. Patient  counseled about possible progression of of PTL. She has undergone NICU consult, knows that there will not be any resuscitative efforts at this GA. She declines IOL at this point, desires expectant management. Continue close observation.  Jaynie Collins, MD, FACOG Attending Obstetrician & Gynecologist Faculty Practice, [facility] [place]

## 2011-12-18 LAB — CBC WITH DIFFERENTIAL/PLATELET
Basophils Absolute: 0 10*3/uL (ref 0.0–0.1)
Basophils Relative: 0 % (ref 0–1)
Eosinophils Absolute: 0.1 10*3/uL (ref 0.0–0.7)
Eosinophils Relative: 1 % (ref 0–5)
HCT: 29.7 % — ABNORMAL LOW (ref 36.0–46.0)
Hemoglobin: 9.8 g/dL — ABNORMAL LOW (ref 12.0–15.0)
Lymphocytes Relative: 12 % (ref 12–46)
Lymphs Abs: 1.2 10*3/uL (ref 0.7–4.0)
MCH: 28.2 pg (ref 26.0–34.0)
MCHC: 33 g/dL (ref 30.0–36.0)
MCV: 85.3 fL (ref 78.0–100.0)
Monocytes Absolute: 0.7 10*3/uL (ref 0.1–1.0)
Monocytes Relative: 6 % (ref 3–12)
Neutro Abs: 8.6 10*3/uL — ABNORMAL HIGH (ref 1.7–7.7)
Neutrophils Relative %: 82 % — ABNORMAL HIGH (ref 43–77)
Platelets: 241 10*3/uL (ref 150–400)
RBC: 3.48 MIL/uL — ABNORMAL LOW (ref 3.87–5.11)
RDW: 14.8 % (ref 11.5–15.5)
WBC: 10.5 10*3/uL (ref 4.0–10.5)

## 2011-12-18 LAB — C-REACTIVE PROTEIN: CRP: 6.7 mg/dL — ABNORMAL HIGH (ref <0.60)

## 2011-12-18 LAB — OB RESULTS CONSOLE GC/CHLAMYDIA
Chlamydia: NEGATIVE
Gonorrhea: NEGATIVE

## 2011-12-18 NOTE — Progress Notes (Signed)
UR Chart review completed.  

## 2011-12-18 NOTE — Progress Notes (Signed)
Pt transferred to room 154 per bed.  Report given to Daylene Katayama RN

## 2011-12-18 NOTE — Progress Notes (Signed)
Patient ID: Kara Gentry, female   DOB: 12-Feb-1976, 36 y.o.   MRN: [id number] FACULTY PRACTICE ANTEPARTUM(COMPREHENSIVE) NOTE  Kara Gentry is a 36 y.o. G3P0020 at [redacted]w[redacted]d by early ultrasound who is admitted for Preterm labor, PROM.   Fetal presentation is cephalic and transverse head right. Length of Stay:  2  Days  Subjective: No contractions Patient reports the fetal movement as active. Patient reports uterine contraction  activity as none. Patient reports  vaginal bleeding as none. Patient describes fluid per vagina as Clear.  Vitals:  Blood pressure 83/39, pulse 104, temperature 98 F (36.7 C), temperature source Oral, resp. rate 18, height 5\' 2"  (1.575 m), weight 72.576 kg (160 lb), last menstrual period 06/17/2011, SpO2 98.00%. Physical Examination:  General appearance - alert, well appearing, and in no distress Heart - normal rate and regular rhythm Abdomen - soft, nontender, nondistended Fundal Height:  consistent with twins Cervical Exam: Not evaluated. and found to be not evaluated/ / and fetal presentation is cephalic and by [place]. Extremities: extremities normal, atraumatic, no cyanosis or edema and Homans sign is negative, no sign of DVT with DTRs 2+ bilaterally Membranes:ruptured  Fetal Monitoring:  normal FHR x2 intermittent  Labs:  Recent Results (from the past 24 hour(s))  APTT   Collection Time   12/17/11 10:37 AM      Component Value Range   aPTT 30  24 - 37 seconds  FIBRINOGEN   Collection Time   12/17/11 10:37 AM      Component Value Range   Fibrinogen 565 (*) 204 - 475 mg/dL  PROTIME-INR   Collection Time   12/17/11 10:37 AM      Component Value Range   Prothrombin Time 13.4  11.6 - 15.2 seconds   INR 1.03  0.00 - 1.49  PREPARE RBC (CROSSMATCH)   Collection Time   12/17/11 11:00 AM      Component Value Range   Order Confirmation ORDER PROCESSED BY BLOOD BANK    TYPE AND SCREEN   Collection Time   12/17/11  3:14 PM      Component Value Range   ABO/RH(D) O POS     Antibody Screen NEG     Sample Expiration 12/20/2011     Unit Number [id number]     Blood Component Type RED CELLS,LR     Unit division 00     Status of Unit ALLOCATED     Transfusion Status OK TO TRANSFUSE     Crossmatch Result Compatible     Unit Number [id number]     Blood Component Type RED CELLS,LR     Unit division 00     Status of Unit ALLOCATED     Transfusion Status OK TO TRANSFUSE     Crossmatch Result Compatible    CBC WITH DIFFERENTIAL   Collection Time   12/18/11  5:15 AM      Component Value Range   WBC 10.5  4.0 - 10.5 K/uL   RBC 3.48 (*) 3.87 - 5.11 MIL/uL   Hemoglobin 9.8 (*) 12.0 - 15.0 g/dL   HCT 96.2 (*) 95.2 - 84.1 %   MCV 85.3  78.0 - 100.0 fL   MCH 28.2  26.0 - 34.0 pg   MCHC 33.0  30.0 - 36.0 g/dL   RDW 32.4  40.1 - 02.7 %   Platelets 241  150 - 400 K/uL   Neutrophils Relative 82 (*) 43 - 77 %   Neutro Abs 8.6 (*) 1.7 - 7.7  K/uL   Lymphocytes Relative 12  12 - 46 %   Lymphs Abs 1.2  0.7 - 4.0 K/uL   Monocytes Relative 6  3 - 12 %   Monocytes Absolute 0.7  0.1 - 1.0 K/uL   Eosinophils Relative 1  0 - 5 %   Eosinophils Absolute 0.1  0.0 - 0.7 K/uL   Basophils Relative 0  0 - 1 %   Basophils Absolute 0.0  0.0 - 0.1 K/uL    Imaging Studies:     Currently EPIC will not allow sonographic studies to automatically populate into notes.  In the meantime, copy and paste results into note or free text.  Medications:  Scheduled    . ampicillin (OMNIPEN) IV  2 g Intravenous Q6H   Followed by  . amoxicillin  500 mg Oral Q8H  . docusate sodium  100 mg Oral Daily  . erythromycin  250 mg Intravenous Q6H   Followed by  . erythromycin  250 mg Oral Q6H  . famotidine  20 mg Oral BID  . prenatal multivitamin  1 tablet Oral Daily  . DISCONTD: docusate sodium  100 mg Oral Daily   I have reviewed the patient's current medications.  ASSESSMENT: There is no problem list on file for this patient.   PLAN: Continue antibiotics and  trendelenburg position. Needs GC/CT sent by urine, will D/C Foley for now.   Dashanna Kinnamon 12/18/2011,9:25 AM

## 2011-12-19 LAB — GC/CHLAMYDIA PROBE AMP, URINE
Chlamydia, Swab/Urine, PCR: NEGATIVE
GC Probe Amp, Urine: NEGATIVE

## 2011-12-19 MED ORDER — MAGNESIUM SULFATE 40 G IN LACTATED RINGERS - SIMPLE
2.0000 g/h | INTRAVENOUS | Status: DC
  Administered 2011-12-21: 2 g/h via INTRAVENOUS
  Filled 2011-12-19 (×2): qty 500

## 2011-12-19 MED ORDER — BETAMETHASONE SOD PHOS & ACET 6 (3-3) MG/ML IJ SUSP
12.0000 mg | INTRAMUSCULAR | Status: AC
  Administered 2011-12-19 – 2011-12-20 (×2): 12 mg via INTRAMUSCULAR
  Filled 2011-12-19 (×2): qty 2

## 2011-12-19 MED ORDER — BETAMETHASONE SOD PHOS & ACET 6 (3-3) MG/ML IJ SUSP: 12.0000 mg | INTRAMUSCULAR | Status: DC

## 2011-12-19 MED ORDER — MAGNESIUM SULFATE BOLUS VIA INFUSION
6.0000 g | Freq: Once | INTRAVENOUS | Status: AC
  Administered 2011-12-21: 6 g via INTRAVENOUS
  Filled 2011-12-19: qty 500

## 2011-12-19 NOTE — Progress Notes (Signed)
Patient ID: Kara Gentry, female   DOB: [birth year], 36 y.o.   MRN: [id number] FACULTY PRACTICE ANTEPARTUM(COMPREHENSIVE) NOTE  Kara Gentry is a 36 y.o. G3P0020 at [redacted]w[redacted]d by early ultrasound who is admitted for rupture of membranes.  Twins Fetal presentation is cephalic and transverse. Length of Stay:  3  Days  Subjective:  Patient reports the fetal movement as active. Patient reports uterine contraction  activity as none. Patient reports  vaginal bleeding as none. Patient describes fluid per vagina as Clear. decreased  Vitals:  Blood pressure 89/50, pulse 102, temperature 98.4 F (36.9 C), temperature source Oral, resp. rate 18, height 5\' 2"  (1.575 m), weight 72.576 kg (160 lb), last menstrual period 06/17/2011, SpO2 98.00%. Physical Examination:  General appearance - alert, well appearing, and in no distress Heart - normal rate and regular rhythm Abdomen - soft, nontender, nondistended Fundal Height:  consistent with twins Cervical Exam: Not evaluated. and found to be not evaluated/ / and fetal presentation is cephalic. Extremities: extremities normal, atraumatic, no cyanosis or edema and Homans sign is negative, no sign of DVT with DTRs 2+ bilaterally Membranes:ruptured  Fetal Monitoring:  normal FHR intermittent  Labs:  No results found for this or any previous visit (from the past 24 hour(s)).  Imaging Studies:     Currently EPIC will not allow sonographic studies to automatically populate into notes.  In the meantime, copy and paste results into note or free text.  Medications:  Scheduled    . ampicillin (OMNIPEN) IV  2 g Intravenous Q6H   Followed by  . amoxicillin  500 mg Oral Q8H  . docusate sodium  100 mg Oral Daily  . erythromycin  250 mg Intravenous Q6H   Followed by  . erythromycin  250 mg Oral Q6H  . famotidine  20 mg Oral BID  . prenatal multivitamin  1 tablet Oral Daily   I have reviewed the patient's current medications.  ASSESSMENT: 22.4 weeks twins  ruptured membranes no PTL but bulging bag and cervix dilated  PLAN: Continue present mgmt for today  Kara Gentry 12/19/2011,7:01 AM

## 2011-12-19 NOTE — Progress Notes (Signed)
FACULTY PRACTICE ANTEPARTUM(COMPREHENSIVE) NOTE  Kara Gentry is a 36 y.o. G3P0020 at [redacted]w[redacted]d by early ultrasound who is admitted for incompetent cervix, PPROM.  Twins Fetal presentation is cephalic Baby A, Transverse baby B Length of Stay:  3  Days  Subjective: Feels comfortable, no cramping, bleeding Patient reports the fetal movement as active. Patient reports uterine contraction  activity as none. Patient reports  vaginal bleeding as spotting. Patient describes fluid per vagina as Clear.  Vitals:  Blood pressure 105/56, pulse 99, temperature 97.8 F (36.6 C), temperature source Oral, resp. rate 20, height 5\' 2"  (1.575 m), weight 74.753 kg (164 lb 12.8 oz), last menstrual period 06/17/2011, SpO2 98.00%. Physical Examination:  General appearance - alert, well appearing, and in no distress Heart - normal rate and regular rhythm Abdomen - soft, nontender, nondistended Fundal Height:  consistent with twins Cervical Exam: Not evaluated. and found to be not evaluated/ Extremities: extremities normal, atraumatic, no cyanosis or edema and Homans sign is negative, no sign of DVT with DTRs 2+ bilaterally Membranes:ruptured, clear fluid  Fetal Monitoring:  reasurring for 23 weeks, no uterine activity  Labs:  No results found for this or any previous visit (from the past 24 hour(s)).    Medications:  Will start BMZ @ [place] tonight and MgSO4 for CP prophylaxis at noon tomorrow (so that both medications are completed when pt turns 23.0 weeks) as pt and her husband both request resuscitation @ viability    . amoxicillin  500 mg Oral Q8H  . betamethasone acetate-betamethasone sodium phosphate  12 mg Intramuscular Q24 Hr x 2  . docusate sodium  100 mg Oral Daily  . erythromycin  250 mg Oral Q6H  . famotidine  20 mg Oral BID  . magnesium  6 g Intravenous Once  . prenatal multivitamin  1 tablet Oral Daily  . DISCONTD: betamethasone acetate-betamethasone sodium phosphate  12 mg Intramuscular 2  days   I have reviewed the patient's current medications.  ASSESSMENT: There is no problem list on file for this patient.   PLAN: Continue present management, medications as above  CRESENZO-DISHMAN,Meeya Goldin 12/19/2011,9:46 PM

## 2011-12-20 ENCOUNTER — Inpatient Hospital Stay (HOSPITAL_COMMUNITY): Payer: BC Managed Care – PPO

## 2011-12-20 ENCOUNTER — Ambulatory Visit (HOSPITAL_COMMUNITY): Payer: BC Managed Care – PPO

## 2011-12-20 LAB — CULTURE, BETA STREP (GROUP B ONLY)

## 2011-12-20 NOTE — Progress Notes (Signed)
FACULTY PRACTICE ANTEPARTUM(COMPREHENSIVE) NOTE  Kara Gentry is a 35 y.o. G3P0020 at [redacted]w[redacted]d by early ultrasound who is admitted for incompetent cervix, PPROM. Twins    Fetal presentation is cephalic Baby A, Transverse baby B  Length of Stay: 3 Days  Subjective:  Feels comfortable, no cramping, bleeding  Patient reports the fetal movement as active.  Patient reports uterine contraction activity as none.  Patient reports vaginal bleeding as spotting.  Patient describes fluid per vagina as Clear.  Vitals: Blood pressure 105/56, pulse 99, temperature 97.8 F (36.6 C), temperature source Oral, resp. rate 20, height 5\' 2"  (1.575 m), weight 74.753 kg (164 lb 12.8 oz), last menstrual period 06/17/2011, SpO2 98.00%.  Physical Examination:  General appearance - alert, well appearing, and in no distress  Heart - normal rate and regular rhythm  Abdomen - soft, nontender, nondistended  Fundal Height: consistent with twins  Cervical Exam: Not evaluated. and found to be not evaluated/  Extremities: extremities normal, atraumatic, no cyanosis or edema and Homans sign is negative, no sign of DVT with DTRs 2+ bilaterally  Membranes:ruptured, clear fluid  Fetal Monitoring: reasurring for 23 weeks, no uterine activity  Labs:  No results found for this or any previous visit (from the past 24 hour(s)).   Assess/Plan: Twin Gestation 22+5, PPROM without labor, probable cervical incomptency, Vertex A, Transverse B BMZ begun last nite at midnight,  Mag Sulfate for neuroprophylaxis ordered, to be completed at 23 weeks Pt and husband desire resuscitation at viability We discussed method of delivery of baby B, in the event of Baby B being Breech.  We discussed the pros and cons of cesarean vs vaginal breech ,and patient is comfortable with vaginal breech delivery of second twin as discussed.

## 2011-12-20 NOTE — Progress Notes (Signed)
12/20/11 1200  Clinical Encounter Type  Visited With Patient and family together (Mother, two aunts)  Visit Type Follow-up;Spiritual support;Social support  Spiritual Encounters  Spiritual Needs Emotional    Kara Gentry was in great spirits, feeling grateful for family support and that she's been [facility] to [facility].  She reports feeling more hopeful now.   I provided pastoral listening, reflection, and encouragement, including a reminder of chaplain availability.  Spiritual Care will continue to follow for support.  [address] [place], [place] [phone]

## 2011-12-21 ENCOUNTER — Encounter (HOSPITAL_COMMUNITY): Payer: Self-pay

## 2011-12-21 ENCOUNTER — Encounter (HOSPITAL_COMMUNITY)
Admit: 2011-12-21 | Discharge: 2011-12-21 | Disposition: A | Discharge status: Home / Self Care | Payer: BC Managed Care – PPO | Attending: Advanced Practice Midwife | Admitting: Advanced Practice Midwife

## 2011-12-21 DIAGNOSIS — O3430 Maternal care for cervical incompetence, unspecified trimester: Secondary | ICD-10-CM

## 2011-12-21 DIAGNOSIS — O30049 Twin pregnancy, dichorionic/diamniotic, unspecified trimester: Secondary | ICD-10-CM

## 2011-12-21 DIAGNOSIS — IMO0002 Reserved for concepts with insufficient information to code with codable children: Secondary | ICD-10-CM

## 2011-12-21 LAB — TYPE AND SCREEN
ABO/RH(D): O POS
Antibody Screen: NEGATIVE
Unit division: 0
Unit division: 0

## 2011-12-21 NOTE — Progress Notes (Signed)
UR Chart review completed.  

## 2011-12-21 NOTE — Progress Notes (Signed)
I visited with Kara Gentry and her father.  This was a follow-up visit from our department.  She was in good spirits today and was grateful for the support she received earlier in the week from [name].  She is aware that there will be ups and downs emotionally and she is taking one thing at a time.    We will continue to check in with her, but please page [place] as needs arise. Pager, [phone].  Chaplain Dyanne Carrel 1:29 PM  12/21/11 1300  Clinical Encounter Type  Visited With Patient and family together  Visit Type Follow-up;Spiritual support

## 2011-12-21 NOTE — Progress Notes (Signed)
Patient ID: Kara Gentry, female   DOB: 02-Apr-1975, 36 y.o.   MRN: [id number] ACULTY PRACTICE ANTEPARTUM COMPREHENSIVE PROGRESS NOTE  Kara Gentry is a 36 y.o. G3P0020 at [redacted]w[redacted]d  who is admitted for PROM.   Fetal presentation is transverse/transverse with cephalic on right side . Length of Stay:  5  Days  Subjective: Pt with c/o cough when changing positions only.  Feels dry.  Husband is bringing humidifier.  Patient reports good fetal movement.  She reports no uterine contractions, no bleeding and no loss of fluid per vagina.  Pt with no pain or fever or chills.  Vitals:  Blood pressure 94/50, pulse 108, temperature 98.7 F (37.1 C), temperature source Oral, resp. rate 18, height 5\' 2"  (1.575 m), weight 74.753 kg (164 lb 12.8 oz), last menstrual period 06/17/2011, SpO2 98.00%. Physical Examination:  General appearance - alert, well appearing, and in no distress Abdomen - soft, nontender, nondistended, no masses or organomegaly Extremities - peripheral pulses normal, no pedal edema, no clubbing or cyanosis, no pedal edema noted Pt was getting a sono at the time of my exam  Fetal Monitoring:  Baseline: 150's x2 bpm, Variability: Good {> 6 bpm) and Accelerations: Non-reactive but appropriate for gestational age  Labs:  No results found for this or any previous visit (from the past 24 hour(s)).  Imaging Studies:    sono done today.  Full report pending   Medications:  Scheduled    . amoxicillin  500 mg Oral Q8H  . betamethasone acetate-betamethasone sodium phosphate  12 mg Intramuscular Q24 Hr x 2  . docusate sodium  100 mg Oral Daily  . erythromycin  250 mg Oral Q6H  . famotidine  20 mg Oral BID  . magnesium  6 g Intravenous Once  . prenatal multivitamin  1 tablet Oral Daily   I have reviewed the patient's current medications.  ASSESSMENT: There is no problem list on file for this patient. PROM of twin gestation S/p 2 does of BMZ To begin magnesium sulfate for CP  prophylaxis-order written  PLAN: Magnesium for CP prophylaxis to begin at  1200 D/W pt and parents need for c-section at viability for deliver if infants remain in transverse position  Continue routine antenatal care.   HARRAWAY-SMITH, Asencion Guisinger 12/21/2011,8:42 AM

## 2011-12-21 NOTE — Consult Note (Signed)
MFM consult  36 yr old G3P0020 at [redacted]w[redacted]d with IVF dichorionic/diamniotic twin gestation and cervical insufficiency with advanced dilation and bulging membranes/suspected PPROM referred by Dr. Penne Lash for ultrasound and consult.  Past OB Hx: 1 ectopic; 1 SAB  No significant past medical history  This is an IVF pregnancy. Uncomplicated until patient presented with bulging membranes several days ago. Patient reports only rare mild cramping. Has had some bleeding and leaking of fluid in the hospital and ferning was positive so suspect rupture of membranes. Patient currently reports no contractions, leaking of fluid, or vaginal bleeding. No fever or uterine tenderness.   Ultrasound today shows: Dichorionic/diamniotic twin gestation; the dividing membrane is seen. Twin A with an anterior placenta; twin B with a posterior placenta; there is no evidence of previa. There is advanced cervical dilation seen with membranes extending into the vagina. Both fetuses are in trasverse presentation.  No umbilical cord is seen in the cervix or vagina at this time. Normal amniotic fluid volume for both fetuses; although twin A has a significant amount of fluid in the membranes in the vagina. There is suspected polydactyly on the right hand of twin B. The remainder of the limited anatomy survey for both fetuses is normal.    I counseled the patient as follows:   1. Dichorionic/diamniotic twin gestation:  - discussed increased risk of preterm labor/PPROM/preterm cervical dilation and preterm delivery- average age of delivery is 82 weeks with dichorionic twins - although given current clinical status risk of preterm delivery is certain and risk of extreme prematurity is high - discussed increased risk of fetal growth restriction- recommend fetal growth every 4 weeks - discussed increased risk of maternal complications including gestational diabetes and preeclampsia as well as need for C section  2. Cervical  insufficiency/advanced cervical dilation/suspected PPROM:  - suspect ferning was positive secondary to exposed membranes as on ultrasound there does seem to be intact membranes; although may have a small leak; regardless given exposed membranes recommend treating as if had PPROM with 7 day course of antibiotics and delivery no later than [redacted] weeks gestation Discussed that cervical dilation at this gestational age places the patient at significant risk of PPROM, preterm labor, and preterm delivery.  Patient met with NICU team and was counseled on outcomes for fetuses at 23-[redacted] weeks gestation.  Based on their counseling and my discussion with the patient today the following plan was decided on:  - patient would like intervention at 23 weeks (tomorrow); including C section for distress or labor with fetal malpresentation  - I discussed the increased risk of fetal morbidity and mortality with breech delivery of preterm fetus <1500gm. Both fetuses are transverse at this time. Also discussed with very low birth weight <750g there may be a small benefit for C section regardless of fetal position - given patient would like full intervention at 23 weeks recommend TID fetal monitoring (or more frequently as needed), plan for C section for fetal distress or labor with fetal malpresentation (currently desires vaginal delivery if both fetuses are cephalic), and full neonatal resuscitation. I I discussed possible need for classical C section which would preclude that all future pregnancies be delivered via C section at [redacted] weeks gestation secondary to the increased risk of uterine rupture. I also discussed the risk of placenta accreta and adhesions associated with more than 1 C section. I discussed risks of C section include: bleeding, infection, damage to surrounding organs, thrombotic event, and death.  - patient has received a  course of betamethasone- I discussed the benefits of steroids include decreasing the risk of  respiratory distress syndrome, time on ventilator, risk of IVH, and risk of NEC. Discussed that there is a limit of 2 courses that can be given in pregnancy. Patient has received one course and recommend reserve the 2nd course of betamethasone if patient develops signs/symptoms of preterm labor or PPROM with probable delivery. Discussed no known adverse effects to 2 courses of betamethasone; although may temporarily increase maternal blood sugars   - would recommend tocolysis if patient begins having contractions; would consider indocin as a first line at this gestational age, but procardia is an alternative - we also discussed use of magnesium sulfate for neuroprotection in the setting of imminent delivery- I would recommend using magnesium if clinical scenario changes: i.e. PPROM or further cervical dilation, infection requiring delivery, or preterm labor. Would give a load of 6 grams followed by 2 grams an hour until delivery; if does not deliver within 12 hours recommend discontinue magnesium sulfate. Retreatment with magnesium sulfate if patient does not deliver is controversial however the BEAM study allowed for retreatment if <34 weeks and had been greater than 6 hours since prior treatment. Would not recommend use magnesium sulfate for neuroprotection after [redacted] weeks gestation as there is no proven benefit after that gestational age  - I discussed option of progesterone which has been shown to decrease the risk of preterm delivery by up to 40% in women with a short cervix in singleton pregnancies. I discussed it has not shown to be of benefit statistically in twins (although slight trend toward decreased risk of preterm labor) but given patient may have rupture of membranes and has bulging bag in the vagina do not recommend vaginal progesterone. - briefly discussed possible increased risk of cervical insufficiency in future pregnancies although preterm delivery with twins does not necessarily predict  future preterm delivery risk with a singleton. Would recommend early consult with MFM to discuss options of cervical length surveillance, progesterone, and cerclage although I do not feel patient is a cerclage candidate given this pregnancy is a twin gestation but would recommend counseling early in a subsequent pregnancy to discuss all options. - would recommend hospitalization until delivery given advanced cervical dilation and suspected PPROM - discussed no data to support Trendelenburg position or refraining from bathroom privileges therefore feel these can be discontinued; recommend bedrest otherwise  - recommend weekly CBC  - close surveillance for development of signs/symptoms of chorioamnionitis or labor  - if patient remains pregnant recommend delivery at 34 weeks or sooner for signs of labor or chorioamnionitis 3. Recommend fetal growth early next week.  4. Advanced maternal age:  - per OB records nuchal translucencies done although I do not have the results  5. Possible polydactyly in twin B:  - not discussed with the patient at this time; will reevaluate on fetal growth ultrasound and counsel at that time   I spent 60 minutes in face to face consultation with the patient in addition to time spent on the ultrasound. Discussed the above with Dr. Penne Lash. Eulis Foster, MD

## 2011-12-21 NOTE — Progress Notes (Signed)
[facility] ultrasound  Indication: 36 yr old G64P0020 at [redacted]w[redacted]d with IVF dichorionic/diamniotic twin gestation and cervical insufficiency with advanced dilation and bulging membranes/suspected PPROM for ultrasound.  Findings: 1. Dichorionic/diamniotic twin gestation; the dividing membrane is seen. 2. Twin A with an anterior placenta; twin B with a posterior placenta; there is no evidence of previa. 3. There is advanced cervical dilation seen with membranes extending into the vagina. 4. Both fetuses are in trasverse presentation. 5. No umbilical cord is seen in the cervix or vagina at this time. 6. Normal amniotic fluid volume for both fetuses; although twin A has a significant amount of fluid in the membranes in the vagina. 7. There is suspected polydactyly on the right hand of twin B.  8. The remainder of the limited anatomy survey for both fetuses is normal.  Recommendations: 1. Dichorionic/diamniotic twin gestation: - see consult note 2. Cervical insufficiency/advanced cervical dilation/suspected PPROM: - see consult note. 3. Recommend fetal growth early next week. 4. Advanced maternal age: - per OB records nuchal translucencies done although I do not have the results 5. Possible polydactyly in twin B: - not discussed with the patient at this time; will reevaluate on fetal growth ultrasound and counsel at that time  Eulis Foster, MD

## 2011-12-22 ENCOUNTER — Encounter (HOSPITAL_COMMUNITY): Payer: Self-pay

## 2011-12-22 ENCOUNTER — Encounter (HOSPITAL_COMMUNITY): Payer: Self-pay | Admitting: *Deleted

## 2011-12-22 ENCOUNTER — Encounter (HOSPITAL_COMMUNITY)
Admission: AD | Disposition: A | Discharge status: Home / Self Care | Payer: Self-pay | Source: Ambulatory Visit | Attending: Obstetrics & Gynecology

## 2011-12-22 ENCOUNTER — Inpatient Hospital Stay (HOSPITAL_COMMUNITY): Payer: BC Managed Care – PPO

## 2011-12-22 DIAGNOSIS — O3430 Maternal care for cervical incompetence, unspecified trimester: Secondary | ICD-10-CM

## 2011-12-22 DIAGNOSIS — O30009 Twin pregnancy, unspecified number of placenta and unspecified number of amniotic sacs, unspecified trimester: Secondary | ICD-10-CM

## 2011-12-22 DIAGNOSIS — O4290 Premature rupture of membranes, unspecified as to length of time between rupture and onset of labor, unspecified weeks of gestation: Secondary | ICD-10-CM

## 2011-12-22 LAB — TYPE AND SCREEN
ABO/RH(D): O POS
Antibody Screen: NEGATIVE

## 2011-12-22 LAB — CBC
HCT: 30.9 % — ABNORMAL LOW (ref 36.0–46.0)
Hemoglobin: 10.6 g/dL — ABNORMAL LOW (ref 12.0–15.0)
MCH: 29.2 pg (ref 26.0–34.0)
MCHC: 34.3 g/dL (ref 30.0–36.0)
MCV: 85.1 fL (ref 78.0–100.0)
Platelets: 276 10*3/uL (ref 150–400)
RBC: 3.63 MIL/uL — ABNORMAL LOW (ref 3.87–5.11)
RDW: 14.8 % (ref 11.5–15.5)
WBC: 14.5 10*3/uL — ABNORMAL HIGH (ref 4.0–10.5)

## 2011-12-22 SURGERY — Surgical Case
Anesthesia: Spinal | Site: Abdomen | Wound class: Clean Contaminated

## 2011-12-22 MED ORDER — BUPIVACAINE HCL (PF) 0.25 % IJ SOLN
INTRAMUSCULAR | Status: AC
  Filled 2011-12-22: qty 30

## 2011-12-22 MED ORDER — ONDANSETRON HCL 4 MG/2ML IJ SOLN: 4.0000 mg | Freq: Four times a day (QID) | INTRAMUSCULAR | Status: DC | PRN

## 2011-12-22 MED ORDER — DIPHENHYDRAMINE HCL 25 MG PO CAPS
25.0000 mg | ORAL_CAPSULE | ORAL | Status: DC | PRN
  Filled 2011-12-22: qty 1

## 2011-12-22 MED ORDER — OXYTOCIN 10 UNIT/ML IJ SOLN
INTRAMUSCULAR | Status: AC
  Filled 2011-12-22: qty 4

## 2011-12-22 MED ORDER — EPHEDRINE SULFATE 50 MG/ML IJ SOLN
INTRAMUSCULAR | Status: DC | PRN
  Administered 2011-12-22 (×2): 5 mg via INTRAVENOUS

## 2011-12-22 MED ORDER — DIBUCAINE 1 % RE OINT: 1.0000 "application " | TOPICAL_OINTMENT | RECTAL | Status: DC | PRN

## 2011-12-22 MED ORDER — DIPHENHYDRAMINE HCL 50 MG/ML IJ SOLN: 12.5000 mg | INTRAMUSCULAR | Status: DC | PRN

## 2011-12-22 MED ORDER — MORPHINE SULFATE 0.5 MG/ML IJ SOLN
INTRAMUSCULAR | Status: AC
  Filled 2011-12-22: qty 10

## 2011-12-22 MED ORDER — CEFAZOLIN SODIUM-DEXTROSE 2-3 GM-% IV SOLR
INTRAVENOUS | Status: AC
  Filled 2011-12-22: qty 50

## 2011-12-22 MED ORDER — ZOLPIDEM TARTRATE 5 MG PO TABS
5.0000 mg | ORAL_TABLET | Freq: Every evening | ORAL | Status: DC | PRN
  Filled 2011-12-22: qty 1

## 2011-12-22 MED ORDER — EPHEDRINE 5 MG/ML INJ
INTRAVENOUS | Status: AC
  Filled 2011-12-22: qty 10

## 2011-12-22 MED ORDER — DEXTROSE IN LACTATED RINGERS 5 % IV SOLN: INTRAVENOUS | Status: DC

## 2011-12-22 MED ORDER — CITRIC ACID-SODIUM CITRATE 334-500 MG/5ML PO SOLN
ORAL | Status: AC
  Filled 2011-12-22: qty 15

## 2011-12-22 MED ORDER — DIPHENHYDRAMINE HCL 50 MG/ML IJ SOLN: 25.0000 mg | INTRAMUSCULAR | Status: DC | PRN

## 2011-12-22 MED ORDER — CEFAZOLIN SODIUM-DEXTROSE 2-3 GM-% IV SOLR
INTRAVENOUS | Status: DC | PRN
  Administered 2011-12-22: 2 g via INTRAVENOUS

## 2011-12-22 MED ORDER — TETANUS-DIPHTH-ACELL PERTUSSIS 5-2.5-18.5 LF-MCG/0.5 IM SUSP
0.5000 mL | Freq: Once | INTRAMUSCULAR | Status: AC
  Administered 2011-12-25: 0.5 mL via INTRAMUSCULAR
  Filled 2011-12-22: qty 0.5

## 2011-12-22 MED ORDER — KETOROLAC TROMETHAMINE 30 MG/ML IJ SOLN: 30.0000 mg | Freq: Four times a day (QID) | INTRAMUSCULAR | Status: DC | PRN

## 2011-12-22 MED ORDER — ONDANSETRON HCL 4 MG/2ML IJ SOLN
INTRAMUSCULAR | Status: AC
  Filled 2011-12-22: qty 2

## 2011-12-22 MED ORDER — METOCLOPRAMIDE HCL 5 MG/ML IJ SOLN: 10.0000 mg | Freq: Three times a day (TID) | INTRAMUSCULAR | Status: DC | PRN

## 2011-12-22 MED ORDER — BUPIVACAINE IN DEXTROSE 0.75-8.25 % IT SOLN
INTRATHECAL | Status: DC | PRN
  Administered 2011-12-22: 1.4 mL via INTRATHECAL

## 2011-12-22 MED ORDER — DIPHENHYDRAMINE HCL 50 MG/ML IJ SOLN: 12.5000 mg | Freq: Four times a day (QID) | INTRAMUSCULAR | Status: DC | PRN

## 2011-12-22 MED ORDER — GLYCOPYRROLATE 0.2 MG/ML IJ SOLN
INTRAMUSCULAR | Status: DC | PRN
  Administered 2011-12-22: 0.2 mg via INTRAVENOUS

## 2011-12-22 MED ORDER — FENTANYL CITRATE 0.05 MG/ML IJ SOLN
INTRAMUSCULAR | Status: DC | PRN
  Administered 2011-12-22: 25 ug via INTRAVENOUS
  Administered 2011-12-22: 25 ug via INTRATHECAL

## 2011-12-22 MED ORDER — HYDROMORPHONE 0.3 MG/ML IV SOLN
INTRAVENOUS | Status: DC
  Administered 2011-12-22: 0.8 mg via INTRAVENOUS
  Administered 2011-12-22: 0.999 mg via INTRAVENOUS
  Administered 2011-12-23: 1.39 mg via INTRAVENOUS
  Administered 2011-12-23: 1.19 mg via INTRAVENOUS

## 2011-12-22 MED ORDER — NALBUPHINE HCL 10 MG/ML IJ SOLN
5.0000 mg | INTRAMUSCULAR | Status: DC | PRN
Start: 2011-12-22 — End: 2011-12-25
  Filled 2011-12-22: qty 1

## 2011-12-22 MED ORDER — ONDANSETRON HCL 4 MG/2ML IJ SOLN: 4.0000 mg | INTRAMUSCULAR | Status: DC | PRN

## 2011-12-22 MED ORDER — NALOXONE HCL 0.4 MG/ML IJ SOLN: 0.4000 mg | INTRAMUSCULAR | Status: DC | PRN

## 2011-12-22 MED ORDER — BUPIVACAINE HCL (PF) 0.25 % IJ SOLN
INTRAMUSCULAR | Status: DC | PRN
  Administered 2011-12-22: 30 mL

## 2011-12-22 MED ORDER — SIMETHICONE 80 MG PO CHEW
80.0000 mg | CHEWABLE_TABLET | ORAL | Status: DC | PRN
  Administered 2011-12-24: 80 mg via ORAL

## 2011-12-22 MED ORDER — DIPHENHYDRAMINE HCL 12.5 MG/5ML PO ELIX: 12.5000 mg | ORAL_SOLUTION | Freq: Four times a day (QID) | ORAL | Status: DC | PRN

## 2011-12-22 MED ORDER — PRENATAL MULTIVITAMIN CH
1.0000 | ORAL_TABLET | Freq: Every day | ORAL | Status: DC
  Administered 2011-12-23 – 2011-12-25 (×3): 1 via ORAL
  Filled 2011-12-22 (×3): qty 1

## 2011-12-22 MED ORDER — FERROUS SULFATE 325 (65 FE) MG PO TABS
325.0000 mg | ORAL_TABLET | Freq: Two times a day (BID) | ORAL | Status: DC
  Administered 2011-12-23 – 2011-12-25 (×4): 325 mg via ORAL
  Filled 2011-12-22 (×4): qty 1

## 2011-12-22 MED ORDER — FENTANYL CITRATE 0.05 MG/ML IJ SOLN
INTRAMUSCULAR | Status: AC
  Administered 2011-12-22: 50 ug via INTRAVENOUS
  Filled 2011-12-22: qty 2

## 2011-12-22 MED ORDER — SODIUM CHLORIDE 0.9 % IJ SOLN: 3.0000 mL | INTRAMUSCULAR | Status: DC | PRN

## 2011-12-22 MED ORDER — CITRIC ACID-SODIUM CITRATE 334-500 MG/5ML PO SOLN
30.0000 mL | Freq: Once | ORAL | Status: AC
  Administered 2011-12-22: 30 mL via ORAL

## 2011-12-22 MED ORDER — SODIUM CHLORIDE 0.9 % IR SOLN
Status: DC | PRN
  Administered 2011-12-22: 1000 mL

## 2011-12-22 MED ORDER — SODIUM CHLORIDE 0.9 % IV SOLN
1.0000 ug/kg/h | INTRAVENOUS | Status: DC | PRN
  Filled 2011-12-22: qty 2.5

## 2011-12-22 MED ORDER — MEASLES, MUMPS & RUBELLA VAC ~~LOC~~ INJ
0.5000 mL | INJECTION | Freq: Once | SUBCUTANEOUS | Status: DC
  Filled 2011-12-22: qty 0.5

## 2011-12-22 MED ORDER — MORPHINE SULFATE (PF) 0.5 MG/ML IJ SOLN
INTRAMUSCULAR | Status: DC | PRN
  Administered 2011-12-22: .15 mg via INTRATHECAL

## 2011-12-22 MED ORDER — FENTANYL CITRATE 0.05 MG/ML IJ SOLN
25.0000 ug | INTRAMUSCULAR | Status: DC | PRN
  Administered 2011-12-22 (×2): 50 ug via INTRAVENOUS

## 2011-12-22 MED ORDER — IBUPROFEN 600 MG PO TABS: 600.0000 mg | ORAL_TABLET | Freq: Four times a day (QID) | ORAL | Status: DC | PRN

## 2011-12-22 MED ORDER — OXYCODONE-ACETAMINOPHEN 5-325 MG PO TABS
1.0000 | ORAL_TABLET | ORAL | Status: DC | PRN
  Administered 2011-12-23 – 2011-12-24 (×2): 1 via ORAL
  Filled 2011-12-22 (×2): qty 1

## 2011-12-22 MED ORDER — SODIUM CHLORIDE 0.9 % IJ SOLN: 9.0000 mL | INTRAMUSCULAR | Status: DC | PRN

## 2011-12-22 MED ORDER — GLYCOPYRROLATE 0.2 MG/ML IJ SOLN
INTRAMUSCULAR | Status: AC
  Filled 2011-12-22: qty 1

## 2011-12-22 MED ORDER — MENTHOL 3 MG MT LOZG: 1.0000 | LOZENGE | OROMUCOSAL | Status: DC | PRN

## 2011-12-22 MED ORDER — LACTATED RINGERS IV SOLN
INTRAVENOUS | Status: DC | PRN
  Administered 2011-12-22 (×3): via INTRAVENOUS

## 2011-12-22 MED ORDER — ONDANSETRON HCL 4 MG/2ML IJ SOLN
INTRAMUSCULAR | Status: DC | PRN
  Administered 2011-12-22: 4 mg via INTRAVENOUS

## 2011-12-22 MED ORDER — OXYTOCIN 40 UNITS IN LACTATED RINGERS INFUSION - SIMPLE MED
62.5000 mL/h | INTRAVENOUS | Status: AC
  Administered 2011-12-22: 62.5 mL/h via INTRAVENOUS
  Filled 2011-12-22: qty 1000

## 2011-12-22 MED ORDER — DIPHENHYDRAMINE HCL 25 MG PO CAPS: 25.0000 mg | ORAL_CAPSULE | Freq: Four times a day (QID) | ORAL | Status: DC | PRN

## 2011-12-22 MED ORDER — NALBUPHINE HCL 10 MG/ML IJ SOLN
5.0000 mg | INTRAMUSCULAR | Status: DC | PRN
  Filled 2011-12-22: qty 1

## 2011-12-22 MED ORDER — SCOPOLAMINE 1 MG/3DAYS TD PT72
1.0000 | MEDICATED_PATCH | Freq: Once | TRANSDERMAL | Status: DC
  Administered 2011-12-22: 1.5 mg via TRANSDERMAL
  Filled 2011-12-22: qty 1

## 2011-12-22 MED ORDER — PHENYLEPHRINE HCL 10 MG/ML IJ SOLN
INTRAMUSCULAR | Status: DC | PRN
  Administered 2011-12-22 (×3): 40 ug via INTRAVENOUS

## 2011-12-22 MED ORDER — ONDANSETRON HCL 4 MG/2ML IJ SOLN: 4.0000 mg | Freq: Three times a day (TID) | INTRAMUSCULAR | Status: DC | PRN

## 2011-12-22 MED ORDER — PHENYLEPHRINE 40 MCG/ML (10ML) SYRINGE FOR IV PUSH (FOR BLOOD PRESSURE SUPPORT)
PREFILLED_SYRINGE | INTRAVENOUS | Status: AC
  Filled 2011-12-22: qty 5

## 2011-12-22 MED ORDER — IBUPROFEN 600 MG PO TABS
600.0000 mg | ORAL_TABLET | Freq: Four times a day (QID) | ORAL | Status: DC
  Administered 2011-12-23 – 2011-12-25 (×10): 600 mg via ORAL
  Filled 2011-12-22 (×11): qty 1

## 2011-12-22 MED ORDER — MEPERIDINE HCL 25 MG/ML IJ SOLN: 6.2500 mg | INTRAMUSCULAR | Status: DC | PRN

## 2011-12-22 MED ORDER — SIMETHICONE 80 MG PO CHEW
80.0000 mg | CHEWABLE_TABLET | Freq: Three times a day (TID) | ORAL | Status: DC
  Administered 2011-12-22 – 2011-12-25 (×7): 80 mg via ORAL

## 2011-12-22 MED ORDER — ONDANSETRON HCL 4 MG PO TABS: 4.0000 mg | ORAL_TABLET | ORAL | Status: DC | PRN

## 2011-12-22 MED ORDER — SENNOSIDES-DOCUSATE SODIUM 8.6-50 MG PO TABS
2.0000 | ORAL_TABLET | Freq: Every day | ORAL | Status: DC
  Administered 2011-12-22 – 2011-12-24 (×3): 2 via ORAL

## 2011-12-22 MED ORDER — LACTATED RINGERS IV SOLN
40.0000 [IU] | INTRAVENOUS | Status: DC | PRN
  Administered 2011-12-22: 40 [IU] via INTRAVENOUS

## 2011-12-22 MED ORDER — HYDROMORPHONE 0.3 MG/ML IV SOLN
INTRAVENOUS | Status: DC
  Administered 2011-12-22: 15:00:00 via INTRAVENOUS
  Filled 2011-12-22: qty 25

## 2011-12-22 MED ORDER — LACTATED RINGERS IV SOLN
INTRAVENOUS | Status: DC | PRN
  Administered 2011-12-22: 12:00:00 via INTRAVENOUS

## 2011-12-22 MED ORDER — LANOLIN HYDROUS EX OINT: 1.0000 "application " | TOPICAL_OINTMENT | CUTANEOUS | Status: DC | PRN

## 2011-12-22 MED ORDER — WITCH HAZEL-GLYCERIN EX PADS: 1.0000 "application " | MEDICATED_PAD | CUTANEOUS | Status: DC | PRN

## 2011-12-22 MED ORDER — FENTANYL CITRATE 0.05 MG/ML IJ SOLN
INTRAMUSCULAR | Status: AC
  Filled 2011-12-22: qty 2

## 2011-12-22 SURGICAL SUPPLY — 37 items
BARRIER ADHS 3X4 INTERCEED (GAUZE/BANDAGES/DRESSINGS) ×1 IMPLANT
BRR ADH 4X3 ABS CNTRL BYND (GAUZE/BANDAGES/DRESSINGS) ×1
CLOTH BEACON ORANGE TIMEOUT ST (SAFETY) ×2 IMPLANT
DRAPE SURG 17X23 STRL (DRAPES) ×2 IMPLANT
DRESSING TELFA 8X3 (GAUZE/BANDAGES/DRESSINGS) ×3 IMPLANT
DRSG COVADERM 4X10 (GAUZE/BANDAGES/DRESSINGS) ×2 IMPLANT
DURAPREP 26ML APPLICATOR (WOUND CARE) ×2 IMPLANT
ELECT REM PT RETURN 9FT ADLT (ELECTROSURGICAL) ×2
ELECTRODE REM PT RTRN 9FT ADLT (ELECTROSURGICAL) ×1 IMPLANT
EXTRACTOR VACUUM M CUP 4 TUBE (SUCTIONS) IMPLANT
GAUZE SPONGE 4X4 12PLY STRL LF (GAUZE/BANDAGES/DRESSINGS) ×2 IMPLANT
GLOVE BIOGEL PI IND STRL 7.0 (GLOVE) ×1 IMPLANT
GLOVE BIOGEL PI INDICATOR 7.0 (GLOVE) ×1
GLOVE ECLIPSE 7.0 STRL STRAW (GLOVE) ×4 IMPLANT
GOWN PREVENTION PLUS LG XLONG (DISPOSABLE) ×4 IMPLANT
GOWN PREVENTION PLUS XLARGE (GOWN DISPOSABLE) ×2 IMPLANT
KIT ABG SYR 3ML LUER SLIP (SYRINGE) ×2 IMPLANT
NDL HYPO 25X5/8 SAFETYGLIDE (NEEDLE) IMPLANT
NEEDLE HYPO 22GX1.5 SAFETY (NEEDLE) ×2 IMPLANT
NEEDLE HYPO 25X5/8 SAFETYGLIDE (NEEDLE) ×4 IMPLANT
NS IRRIG 1000ML POUR BTL (IV SOLUTION) ×2 IMPLANT
PACK C SECTION WH (CUSTOM PROCEDURE TRAY) ×2 IMPLANT
PAD ABD 7.5X8 STRL (GAUZE/BANDAGES/DRESSINGS) ×1 IMPLANT
PAD OB MATERNITY 4.3X12.25 (PERSONAL CARE ITEMS) ×1 IMPLANT
RTRCTR C-SECT PINK 25CM LRG (MISCELLANEOUS) ×1 IMPLANT
SLEEVE SCD COMPRESS KNEE MED (MISCELLANEOUS) IMPLANT
STAPLER VISISTAT 35W (STAPLE) IMPLANT
SUT VIC AB 0 CTX 36 (SUTURE) ×8
SUT VIC AB 0 CTX36XBRD ANBCTRL (SUTURE) ×3 IMPLANT
SUT VIC AB 3-0 SH 27 (SUTURE) ×2
SUT VIC AB 3-0 SH 27XBRD (SUTURE) IMPLANT
SUT VIC AB 4-0 KS 27 (SUTURE) ×1 IMPLANT
SYR 30ML LL (SYRINGE) ×2 IMPLANT
TAPE CLOTH SURG 4X10 WHT LF (GAUZE/BANDAGES/DRESSINGS) ×1 IMPLANT
TOWEL OR 17X24 6PK STRL BLUE (TOWEL DISPOSABLE) ×4 IMPLANT
TRAY FOLEY CATH 14FR (SET/KITS/TRAYS/PACK) ×2 IMPLANT
WATER STERILE IRR 1000ML POUR (IV SOLUTION) ×2 IMPLANT

## 2011-12-22 NOTE — Addendum Note (Signed)
Addendum  created 12/22/11 1813 by Lincoln Brigham, CRNA   Modules edited:Notes Section

## 2011-12-22 NOTE — Op Note (Signed)
Preoperative Diagnosis: DC/DA TIUP @ [redacted]w[redacted]d, Preterm labor, PPROM, transverse/transverse lie  Postoperative Diagnosis:  Same  Procedure: Primary vertical cesarean section  Surgeon: Tinnie Gens, M.D.  Assistant: None  Findings: A-Viable female infant, Transverse lie-delivered footling breech Apgars 5, 7,Weight 1 lb 1 oz, B viable female infant, transverse lie, delivered vertex, Apgars pending at time of note, 1 lb.   Estimated blood loss: 1500 cc  Complications: None known  Specimens: Placenta to pathology, cord pH x 2  Reason for procedure: Briefly, the patient is a 36 y.o. [id number] [redacted]w[redacted]d who presented at 48 with DC/DA TIUP from IVF with bulging BOW and 5 cm.  She subsequently ruptured on hospital day 2 and had some heavy bleeding.  She opted for expectant management and stopped contracting.  She received IV Abx, followed by po, BMZ at 22 5/7 and 22 6/7 wks.  She received Magnesium Sulfate x 12 hours.  An MFM consult was done.  Pt. Counseled about options.  At 23 0/7 wks began complaining of pressure.  Sterile speculum exam revealed a hand in the vagina.  MFM phone consult, advised delivery.  She began bleeding and having painful contractions.  Due to fetal lie of transverse and transverse, a classical C-section was performed.  Procedure: Patient is a to the OR where spinal analgesia was administered. She was then placed in a supine position with left lateral tilt. She received 2 g of Ancef and SCDs were in place. A Foley catheter was placed in the bladder.  She was prepped and draped in the usual sterile fashion.A timeout was performed.  A knife was then used to make a Pfannenstiel incision. This incision was carried out to underlying fascia which was divided in the midline with the knife. The incision was extended laterally, sharply.  The fascia was dissected off the underlying rectus, bluntly laterally and sharply in the midline in the superior and inferior directions. The rectus was divided in  the midline.  The peritoneal cavity was entered bluntly. There were adhesions noted between the peritoneum and the uterus near the cornu on the left side, from previous ectopic.  Alexis retractor was placed inside the incision.  A knife was used to make a large vertical incision on the uterus. This incision was carried down to the amniotic cavity was entered.  The placenta was encountered in the incision.  Fetus was in a transverse presentation and was brought up out of the incision footling breech without difficulty. Cord was clamped x 2 and cut. Infant taken to waiting pediatrician. Second bag of waters ruptured and clear fluid noted.  Twin B was in a transverse lie and delivered from the incision vertex without difficulty. Cord pH and Cord blood was obtained x 2.  A cord clamp was placed on cord A. Placentas was delivered from the uterus.  Uterus was cleaned with dry lap pads. Uterine incision closed with 0 Vicryl suture in a locked running fashion. A second layer of 0 Vicryl in an imbricating fashion was used to close the incision.  A third layer of 3-0 Vicryl on an SH in a running baseball stitch helped close the serosa of the uterus.  Alexis retractor was removed from the abdomen. A piece of interseed was placed over the incision.  Peritoneal closure was done with 0 Vicryl suture.  Fascia is closed with 0 Vicryl suture in a running fashion. Subcutaneous tissue infused with 30cc 0.25% Marcaine.  Skin closed using 3-0 Vicryl on a Keith needle.  Pressure dressing  applied.  All instrument, needle and lap counts were correct x 2.  Patient was awake and taken to PACU stable.  Infants to NICU.  Margan Elias SMD 12/22/2011 12:23 PM

## 2011-12-22 NOTE — Anesthesia Preprocedure Evaluation (Signed)
Anesthesia Evaluation  Patient identified by MRN, date of birth, ID band Patient awake    Reviewed: Allergy & Precautions, H&P , NPO status , Patient's Chart, lab work & pertinent test results  History of Anesthesia Complications (+) PONV  Airway Mallampati: III TM Distance: >3 FB Neck ROM: Full    Dental No notable dental hx. (+) Teeth Intact   Pulmonary neg pulmonary ROS,  breath sounds clear to auscultation  Pulmonary exam normal       Cardiovascular negative cardio ROS  Rhythm:Regular Rate:Normal     Neuro/Psych negative neurological ROS  negative psych ROS   GI/Hepatic Neg liver ROS, GERD-  Medicated and Controlled,  Endo/Other  negative endocrine ROS  Renal/GU negative Renal ROS  negative genitourinary   Musculoskeletal negative musculoskeletal ROS (+)   Abdominal   Peds  Hematology negative hematology ROS (+)   Anesthesia Other Findings   Reproductive/Obstetrics Preterm Labor Premature Rupture of Membranes Twin Gestation 23 weeks Transverse Lie                           Anesthesia Physical Anesthesia Plan  ASA: II and Emergent  Anesthesia Plan: Spinal   Post-op Pain Management:    Induction: Intravenous  Airway Management Planned: Natural Airway  Additional Equipment:   Intra-op Plan:   Post-operative Plan:   Informed Consent: I have reviewed the patients History and Physical, chart, labs and discussed the procedure including the risks, benefits and alternatives for the proposed anesthesia with the patient or authorized representative who has indicated his/her understanding and acceptance.   Dental advisory given  Plan Discussed with: CRNA, Anesthesiologist and Surgeon  Anesthesia Plan Comments:         Anesthesia Quick Evaluation

## 2011-12-22 NOTE — OR Nursing (Addendum)
Uterus massaged by S. Jionni Helming Charity fundraiser. Four tubes of cord blood sent to lab.  210cc of blood evacuated from uterus during uterine massage.

## 2011-12-22 NOTE — Anesthesia Procedure Notes (Signed)
Spinal  Patient location during procedure: OR Start time: 12/22/2011 11:12 AM Staffing Anesthesiologist: Justin Buechner A. Performed by: anesthesiologist  Preanesthetic Checklist Completed: patient identified, site marked, surgical consent, pre-op evaluation, timeout performed, IV checked, risks and benefits discussed and monitors and equipment checked Spinal Block Patient position: sitting Prep: site prepped and draped and DuraPrep Patient monitoring: heart rate, cardiac monitor, continuous pulse ox and blood pressure Approach: midline Location: L3-4 Injection technique: single-shot Needle Needle type: Sprotte  Needle gauge: 24 G Needle length: 9 cm Needle insertion depth: 4.5 cm Assessment Sensory level: T4 Additional Notes Patient tolerated procedure well. Adequate sensory level.

## 2011-12-22 NOTE — Anesthesia Postprocedure Evaluation (Signed)
  Anesthesia Post-op Note  Patient: Kara Gentry  Procedure(s) Performed: Procedure(s) (LRB) with comments: CESAREAN SECTION (N/A) - Primary cesarean section with delivery of Baby A girl at 1135. Baby B girl at 80.  Patient Location: PACU  Anesthesia Type:Spinal  Level of Consciousness: awake, alert  and oriented  Airway and Oxygen Therapy: Patient Spontanous Breathing  Post-op Pain: none  Post-op Assessment: Post-op Vital signs reviewed, Patient's Cardiovascular Status Stable, Respiratory Function Stable, Patent Airway, No signs of Nausea or vomiting, Pain level controlled, No headache, No backache, No residual numbness and No residual motor weakness  Post-op Vital Signs: Reviewed and stable  Complications: No apparent anesthesia complications

## 2011-12-22 NOTE — Transfer of Care (Signed)
Immediate Anesthesia Transfer of Care Note  Patient: Kara Gentry  Procedure(s) Performed: Procedure(s) (LRB) with comments: CESAREAN SECTION (N/A) - Primary cesarean section with delivery of Baby A girl at 1135. Baby B girl at 47.  Patient Location: PACU  Anesthesia Type:Spinal  Level of Consciousness: awake, alert  and oriented  Airway & Oxygen Therapy: Patient Spontanous Breathing  Post-op Assessment: Report given to PACU RN and Post -op Vital signs reviewed and stable  Post vital signs: Reviewed and stable  Complications: No apparent anesthesia complications

## 2011-12-22 NOTE — Progress Notes (Signed)
Patient ID: Kara Gentry, female   DOB: 03-22-1975, 36 y.o.   MRN: [id number] FACULTY PRACTICE ANTEPARTUM(COMPREHENSIVE) NOTE  Kara Gentry is a 36 y.o. G3P0020 at [redacted]w[redacted]d by date of embryo transfer who is admitted for PROM.  Having a lot of coughing. Fetal presentation is transverse lie. Length of Stay:  6  Days  Subjective: Pt. Complains of rectal pressure despite normal BM this am.  Now with painful uterine contractions. Patient reports the fetal movement as active. Patient reports uterine contraction  activity as regular, every 2-3 minutes. Patient reports  vaginal bleeding as scant staining. Patient describes fluid per vagina as Clear.  Vitals:  Blood pressure 96/51, pulse 99, temperature 98.7 F (37.1 C), temperature source Oral, resp. rate 20, height 5\' 2"  (1.575 m), weight 74.753 kg (164 lb 12.8 oz), last menstrual period 06/17/2011, SpO2 98.00%. Physical Examination:  General appearance - alert, well appearing, and in no distress Abdomen - soft, nontender, nondistended, no masses or organomegaly gravid Extremities - peripheral pulses normal, no pedal edema, no clubbing or cyanosis Skin - normal coloration and turgor, no rashes, no suspicious skin lesions noted Fundal Height:  size greater than dates Pelvic Exam:  Sterile speculum exam reveals copious amounts of clear fluid and hand prolapsing through the cervix. Extremities: extremities normal, atraumatic, no cyanosis or edema  Membranes:ruptured, clear fluid  Fetal Monitoring:  Baseline: 150 x 2 bpm  Labs:  No results found for this or any previous visit (from the past 24 hour(s)).  Imaging Studies:    U/S on yesterday revealed trv and trv lie.  Medications:  Scheduled    . amoxicillin  500 mg Oral Q8H  . docusate sodium  100 mg Oral Daily  . erythromycin  250 mg Oral Q6H  . famotidine  20 mg Oral BID  . magnesium  6 g Intravenous Once  . prenatal multivitamin  1 tablet Oral Daily   I have reviewed the patient's  current medications.  ASSESSMENT: Patient Active Problem List  Diagnosis  . Premature rupture of membranes in pregnancy, antepartum  . Preterm labor  . Twin pregnancy, antepartum-DC/DA    PLAN: Discussed with MFM-Dr. Claudean Severance and he agreed with delivery.  Pt. Now appears to be in labor and cannot deliver this way.  Pt. Reiterates the desire to have everything done for these babies.  NICU, OR and anesthesia informed.  Pt. Counseled about need for classical section.  She was informed about this for a long time with Dr. Vincenza Hews yesterday.  Kara Gentry S 12/22/2011,9:44 AM

## 2011-12-22 NOTE — Anesthesia Postprocedure Evaluation (Signed)
  Anesthesia Post-op Note  Patient: Kara Gentry  Procedure(s) Performed: Procedure(s) (LRB) with comments: CESAREAN SECTION (N/A) - Primary cesarean section with delivery of Baby A girl at 1135. Baby B girl at 67.  Patient Location: [facility]  Anesthesia Type:Spinal  Level of Consciousness: awake, alert  and oriented  Airway and Oxygen Therapy: Patient Spontanous Breathing  Post-op Pain: moderate  Post-op Assessment: Patient's Cardiovascular Status Stable, Respiratory Function Stable, Patent Airway and No signs of Nausea or vomiting  Post-op Vital Signs: stable  Complications: No apparent anesthesia complications

## 2011-12-22 NOTE — Progress Notes (Signed)
atttempted to dopler twin A.  Obtained fleeting readings of 110 in OR after spinal

## 2011-12-22 NOTE — Progress Notes (Signed)
Dr Malen Gauze at bedside.  Lab here to draw CBC and Type & Screen

## 2011-12-22 NOTE — Progress Notes (Signed)
Sterile Speculum exam by Dr Shawnie Pons

## 2011-12-23 DIAGNOSIS — N719 Inflammatory disease of uterus, unspecified: Secondary | ICD-10-CM | POA: Diagnosis not present

## 2011-12-23 LAB — CBC
HCT: 23.3 % — ABNORMAL LOW (ref 36.0–46.0)
Hemoglobin: 7.6 g/dL — ABNORMAL LOW (ref 12.0–15.0)
MCH: 28.4 pg (ref 26.0–34.0)
MCHC: 32.6 g/dL (ref 30.0–36.0)
MCV: 86.9 fL (ref 78.0–100.0)
Platelets: 212 10*3/uL (ref 150–400)
RBC: 2.68 MIL/uL — ABNORMAL LOW (ref 3.87–5.11)
RDW: 14.9 % (ref 11.5–15.5)
WBC: 6.6 10*3/uL (ref 4.0–10.5)

## 2011-12-23 MED ORDER — DEXTROSE 5 % IV SOLN
2.0000 g | Freq: Three times a day (TID) | INTRAVENOUS | Status: AC
  Administered 2011-12-23 (×3): 2 g via INTRAVENOUS
  Filled 2011-12-23 (×3): qty 2

## 2011-12-23 NOTE — Progress Notes (Signed)
Subjective: Postpartum Day 1: Cesarean Delivery Patient reports incisional pain.  Trying to nurse.  Babies are doing fairly well in NICU. Objective: Vital signs in last 24 hours: Temp:  [97.3 F (36.3 C)-100.4 F (38 C)] 100.4 F (38 C) (11/03 0542) Pulse Rate:  [72-113] 110  (11/03 0542) Resp:  [16-24] 20  (11/03 0603) BP: (96-122)/(51-81) 122/76 mmHg (11/03 0542) SpO2:  [92 %-100 %] 96 % (11/03 0603)  Physical Exam:  General: alert, cooperative and appears stated age Lochia: appropriate Uterine Fundus: firm Incision: dressing is clean and dry DVT Evaluation: No evidence of DVT seen on physical exam.   Basename 12/23/11 0525 12/22/11 1019  HGB 7.6* 10.6*  HCT 23.3* 30.9*    Assessment/Plan: Status post Cesarean section. Doing well postoperatively.  Low grade temp--will begin prophylactic antibiotics. Continue current care.  Talitha Dicarlo S 12/23/2011, 7:46 AM

## 2011-12-23 NOTE — Clinical Social Work Note (Signed)
CSW attempted x2 to see MOB today.  CSW will pass on to weekday CSW to try to consult with MOB about NICU admit.   

## 2011-12-24 ENCOUNTER — Inpatient Hospital Stay (HOSPITAL_COMMUNITY): Payer: BC Managed Care – PPO

## 2011-12-24 ENCOUNTER — Encounter (HOSPITAL_COMMUNITY): Payer: Self-pay | Admitting: Family Medicine

## 2011-12-24 NOTE — Progress Notes (Signed)
Ur chart review completed.  

## 2011-12-24 NOTE — Progress Notes (Signed)
Subjective: Postpartum Day 2: Cesarean Delivery Patient reports incisional pain, tolerating PO, + flatus and no problems voiding.    Objective: Vital signs in last 24 hours: Temp:  [98.1 F (36.7 C)-99.4 F (37.4 C)] 98.1 F (36.7 C) (11/04 0551) Pulse Rate:  [96-134] 96  (11/04 0551) Resp:  [18-22] 18  (11/04 0551) BP: (97-103)/(48-68) 97/54 mmHg (11/04 0551) SpO2:  [95 %-98 %] 98 % (11/04 0551)  Physical Exam:  General: alert, cooperative and no distress Lochia: appropriate Uterine Fundus: firm Incision: dressing dry DVT Evaluation: No evidence of DVT seen on physical exam.   Basename 12/23/11 0525 12/22/11 1019  HGB 7.6* 10.6*  HCT 23.3* 30.9*    Assessment/Plan: Status post Cesarean section. Doing well postoperatively.  Continue current care Anticipate discharge tomorrow.  Sadrac Zeoli H 12/24/2011, 7:49 AM

## 2011-12-25 LAB — RPR: RPR Ser Ql: NONREACTIVE

## 2011-12-25 MED ORDER — FERROUS SULFATE 325 (65 FE) MG PO TABS: 325.0000 mg | ORAL_TABLET | Freq: Two times a day (BID) | ORAL | Status: DC

## 2011-12-25 MED ORDER — OXYCODONE-ACETAMINOPHEN 5-325 MG PO TABS: 1.0000 | ORAL_TABLET | ORAL | Status: DC | PRN

## 2011-12-25 MED ORDER — LANOLIN HYDROUS EX OINT: 1.0000 "application " | TOPICAL_OINTMENT | CUTANEOUS | Status: DC | PRN

## 2011-12-25 MED ORDER — WITCH HAZEL-GLYCERIN EX PADS: 1.0000 "application " | MEDICATED_PAD | CUTANEOUS | Status: DC | PRN

## 2011-12-25 MED ORDER — DIBUCAINE 1 % RE OINT: 1.0000 "application " | TOPICAL_OINTMENT | RECTAL | Status: DC | PRN

## 2011-12-25 NOTE — Progress Notes (Signed)
12/25/11 1200  Clinical Encounter Type  Visited With Patient and family together  Visit Type Follow-up;Spiritual support;Social support  Spiritual Encounters  Spiritual Needs Emotional    Kara Gentry was so bright-spirited, cheerful, and grateful on this follow-up visit.  She is looking forward to settling in again at home and then returning to visit her babies in the NICU.  I provided witness to her milestones, pastoral reflection, and encouragement.  Spiritual Care will continue to follow family in the NICU.  [address] [place], [place] [phone]

## 2011-12-25 NOTE — Progress Notes (Signed)
Patient discharged home.  Patient verbalized understanding of discharge instructions.  Patient ambulated to NICU to see babies before going home.

## 2011-12-25 NOTE — Discharge Summary (Signed)
Obstetric Discharge Summary Reason for Admission: onset of labor, rupture of membranes Prenatal Procedures: ultrasound, steriods and magnesium Intrapartum Procedures: primary cesarean; pfannestiel skin incision, vertical uterine Postpartum Procedures: none Complications-Operative and Postpartum: none Hemoglobin  Date Value Range Status  12/23/2011 7.6* 12.0 - 15.0 g/dL Final     DELTA CHECK NOTED     REPEATED TO VERIFY     HCT  Date Value Range Status  12/23/2011 23.3* 36.0 - 46.0 % Final    Physical Exam:  General: alert and cooperative Lochia: appropriate Uterine Fundus: firm Incision: healing well, no significant drainage, no dehiscence DVT Evaluation: No evidence of DVT seen on physical exam. Negative Homan's sign. No cords or calf tenderness.  Discharge Diagnoses: Incompetent cervix, Premature labor and PPROM  Discharge Information: Date: 12/25/2011 Activity: pelvic rest Diet: routine Medications: Percocet Condition: stable Instructions: AVS Discharge [id number] Follow-up Information    Schedule an appointment as soon as possible for a visit in 5 weeks to follow up. (with your OB provider)          Newborn Data:   Bonnye, Halle [[id number]]  Live born female  Birth Weight: 1 lb 0.9 oz (479 g) APGAR: 5, 7   Malania, Gawthrop [[id number]]  Live born female  Birth Weight: 15.5 oz (440 g) APGAR: 4, 6  Home with mother.  Felix Pacini 12/25/2011, 7:42 AM

## 2011-12-26 ENCOUNTER — Encounter (HOSPITAL_COMMUNITY)
Admission: RE | Admit: 2011-12-26 | Discharge: 2011-12-26 | Disposition: A | Discharge status: Home / Self Care | Payer: BC Managed Care – PPO | Source: Ambulatory Visit | Attending: Pulmonary Disease | Admitting: Pulmonary Disease

## 2011-12-26 DIAGNOSIS — O923 Agalactia: Secondary | ICD-10-CM | POA: Insufficient documentation

## 2011-12-27 NOTE — Clinical Social Work Maternal (Signed)
Clinical Social Work Department PSYCHOSOCIAL ASSESSMENT - MATERNAL/CHILD 12/24/2011-Late Entry  Patient:  Kara Gentry,Kara Gentry  Account Number:  [id number]  Admit Date:  12/16/2011  Childs Name:   A-Shayla Kozicki  B-Kayla Cashatt    Clinical Social Worker:  Rama Sorci, LCSW   Date/Time:  12/24/2011 11:00 AM  Date Referred:  12/25/2011   Referral source  NICU     Referred reason  NICU   Other referral source:    I:  FAMILY / HOME ENVIRONMENT Child's legal guardian:  PARENT  Guardian - Name Guardian - Age Guardian - Address  Kara Gentry 36 [address] [place], [place] [address]  Kara Gentry  same   Other household support members/support persons Other support:   MOB has family and friends with her and states she has a great support system.    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  FOB is a truck driver for [employer], but is not required to be gone overnight.   Financial resources:  Private Insurance If Medicaid - County:    School / Grade:   Maternity Care Coordinator / Child Services Coordination / Early Interventions:   Babies qualify for Early Intervention Services and will be referred to the [facility] closer to discharge.  Cultural issues impacting care:   None identified    III  STRENGTHS Strengths  Adequate Resources  Compliance with medical plan  Supportive family/friends  Understanding of illness   Strength comment:    IV  RISK FACTORS AND CURRENT PROBLEMS Current Problem:  YES   Risk Factor & Current Problem Patient Issue Family Issue Risk Factor / Current Problem Comment  Adjustment to Illness Y N Babies were born at 23 weeks and are extremely critical    V  SOCIAL WORK ASSESSMENT CSW met with MOB in her third floor room to introduce myself, complete assessment and evaluate how she is coping with babies'  extreme prematurity and admissions to NICU. MOB seemed quiet and reserved, but pleasant.  She had her mother and a friend with her and stated that we could talk with them present.  She reports having a wonderful support system and no concerns with transportation to and from [place] to visit the babies after her discharge.  She seems somewhat guarded when talking about her babies' medical fragility, which CSW thinks is appropriate.  She states she has not begun gathering supplies at this point. CSW explained support services offered by NICU SW and gave contact information.  CSW also discussed signs and symptoms of PPD and emotions to be expected given the situation. MOB seemed open to talking about this and appreciative of CSW's care and concern.  She states no questions or needs at this time and states she feels comfortable calling her doctor if symtoms arise.  CSW informed MOB of babies' eligibility for SSI due to gestational age and weight.  She is interested in applying.  CSW assisted and will submit applications.      VI SOCIAL WORK PLAN Social Work Plan  Psychosocial Support/Ongoing Assessment of Needs   Type of pt/family education:   PPD signs and symptoms  common emotions related to the NICU experience   If child protective services report - county:   If child protective services report - date:   Information/referral to community resources comment:   SSI   Other social work plan:      plan:

## 2011-12-28 ENCOUNTER — Encounter (HOSPITAL_COMMUNITY): Payer: Self-pay | Admitting: *Deleted

## 2012-01-01 ENCOUNTER — Inpatient Hospital Stay (EMERGENCY_DEPARTMENT_HOSPITAL)
Admission: AD | Admit: 2012-01-01 | Discharge: 2012-01-02 | Disposition: A | Discharge status: Home / Self Care | Payer: BC Managed Care – PPO | Source: Ambulatory Visit | Attending: Obstetrics & Gynecology | Admitting: Obstetrics & Gynecology

## 2012-01-01 ENCOUNTER — Encounter (HOSPITAL_COMMUNITY): Payer: Self-pay | Admitting: *Deleted

## 2012-01-01 DIAGNOSIS — O8612 Endometritis following delivery: Secondary | ICD-10-CM | POA: Insufficient documentation

## 2012-01-01 MED ORDER — OXYCODONE-ACETAMINOPHEN 5-325 MG PO TABS
1.0000 | ORAL_TABLET | Freq: Once | ORAL | Status: AC
  Administered 2012-01-01: 2 via ORAL
  Filled 2012-01-01: qty 2

## 2012-01-01 NOTE — MAU Note (Signed)
Stomach is swollen. Lower abdominal pains for the last day. Fever 101.4 earlier today. C/S 11/2

## 2012-01-01 NOTE — Discharge Summary (Signed)
Agree with above note.  LEGGETT,KELLY H. 01/01/2012 9:23 PM

## 2012-01-02 ENCOUNTER — Encounter (HOSPITAL_COMMUNITY): Payer: Self-pay | Admitting: *Deleted

## 2012-01-02 ENCOUNTER — Inpatient Hospital Stay (HOSPITAL_COMMUNITY)
Admission: EM | Admit: 2012-01-02 | Discharge: 2012-01-14 | DRG: 377 | Disposition: A | Discharge status: Home / Self Care | Payer: BC Managed Care – PPO | Attending: Obstetrics and Gynecology | Admitting: Obstetrics and Gynecology

## 2012-01-02 ENCOUNTER — Inpatient Hospital Stay (HOSPITAL_COMMUNITY): Payer: BC Managed Care – PPO

## 2012-01-02 DIAGNOSIS — O8612 Endometritis following delivery: Secondary | ICD-10-CM

## 2012-01-02 DIAGNOSIS — D649 Anemia, unspecified: Secondary | ICD-10-CM | POA: Diagnosis present

## 2012-01-02 DIAGNOSIS — K651 Peritoneal abscess: Secondary | ICD-10-CM | POA: Diagnosis not present

## 2012-01-02 DIAGNOSIS — T8149XA Infection following a procedure, other surgical site, initial encounter: Secondary | ICD-10-CM | POA: Diagnosis present

## 2012-01-02 DIAGNOSIS — N719 Inflammatory disease of uterus, unspecified: Secondary | ICD-10-CM

## 2012-01-02 DIAGNOSIS — D72829 Elevated white blood cell count, unspecified: Secondary | ICD-10-CM | POA: Diagnosis present

## 2012-01-02 DIAGNOSIS — L03319 Cellulitis of trunk, unspecified: Secondary | ICD-10-CM | POA: Diagnosis present

## 2012-01-02 DIAGNOSIS — M726 Necrotizing fasciitis: Secondary | ICD-10-CM | POA: Diagnosis present

## 2012-01-02 DIAGNOSIS — O909 Complication of the puerperium, unspecified: Principal | ICD-10-CM | POA: Diagnosis present

## 2012-01-02 DIAGNOSIS — J309 Allergic rhinitis, unspecified: Secondary | ICD-10-CM | POA: Diagnosis present

## 2012-01-02 DIAGNOSIS — L02219 Cutaneous abscess of trunk, unspecified: Secondary | ICD-10-CM | POA: Diagnosis present

## 2012-01-02 DIAGNOSIS — E876 Hypokalemia: Secondary | ICD-10-CM | POA: Diagnosis not present

## 2012-01-02 LAB — CBC WITH DIFFERENTIAL/PLATELET
Band Neutrophils: 0 % (ref 0–10)
Basophils Absolute: 0 10*3/uL (ref 0.0–0.1)
Basophils Absolute: 0 10*3/uL (ref 0.0–0.1)
Basophils Relative: 0 % (ref 0–1)
Basophils Relative: 0 % (ref 0–1)
Blasts: 0 %
Eosinophils Absolute: 0 10*3/uL (ref 0.0–0.7)
Eosinophils Absolute: 0 10*3/uL (ref 0.0–0.7)
Eosinophils Relative: 0 % (ref 0–5)
Eosinophils Relative: 0 % (ref 0–5)
HCT: 19.8 % — ABNORMAL LOW (ref 36.0–46.0)
HCT: 23.7 % — ABNORMAL LOW (ref 36.0–46.0)
Hemoglobin: 6.7 g/dL — CL (ref 12.0–15.0)
Hemoglobin: 8.1 g/dL — ABNORMAL LOW (ref 12.0–15.0)
Lymphocytes Relative: 10 % — ABNORMAL LOW (ref 12–46)
Lymphocytes Relative: 4 % — ABNORMAL LOW (ref 12–46)
Lymphs Abs: 0.7 10*3/uL (ref 0.7–4.0)
Lymphs Abs: 2.1 10*3/uL (ref 0.7–4.0)
MCH: 27.8 pg (ref 26.0–34.0)
MCH: 28 pg (ref 26.0–34.0)
MCHC: 33.8 g/dL (ref 30.0–36.0)
MCHC: 34.2 g/dL (ref 30.0–36.0)
MCV: 82 fL (ref 78.0–100.0)
MCV: 82.2 fL (ref 78.0–100.0)
Metamyelocytes Relative: 0 %
Monocytes Absolute: 0.3 10*3/uL (ref 0.1–1.0)
Monocytes Absolute: 0.4 10*3/uL (ref 0.1–1.0)
Monocytes Relative: 2 % — ABNORMAL LOW (ref 3–12)
Monocytes Relative: 2 % — ABNORMAL LOW (ref 3–12)
Myelocytes: 0 %
Neutro Abs: 15.6 10*3/uL — ABNORMAL HIGH (ref 1.7–7.7)
Neutro Abs: 18.1 10*3/uL — ABNORMAL HIGH (ref 1.7–7.7)
Neutrophils Relative %: 88 % — ABNORMAL HIGH (ref 43–77)
Neutrophils Relative %: 94 % — ABNORMAL HIGH (ref 43–77)
Platelets: 520 10*3/uL — ABNORMAL HIGH (ref 150–400)
Platelets: 631 10*3/uL — ABNORMAL HIGH (ref 150–400)
Promyelocytes Absolute: 0 %
RBC: 2.41 MIL/uL — ABNORMAL LOW (ref 3.87–5.11)
RBC: 2.89 MIL/uL — ABNORMAL LOW (ref 3.87–5.11)
RDW: 14.4 % (ref 11.5–15.5)
RDW: 14.5 % (ref 11.5–15.5)
WBC: 16.7 10*3/uL — ABNORMAL HIGH (ref 4.0–10.5)
WBC: 20.6 10*3/uL — ABNORMAL HIGH (ref 4.0–10.5)
nRBC: 0 /100 WBC

## 2012-01-02 LAB — URINALYSIS, ROUTINE W REFLEX MICROSCOPIC
Bilirubin Urine: NEGATIVE
Glucose, UA: NEGATIVE mg/dL
Ketones, ur: NEGATIVE mg/dL
Nitrite: NEGATIVE
Protein, ur: 30 mg/dL — AB
Specific Gravity, Urine: 1.025 (ref 1.005–1.030)
Urobilinogen, UA: 4 mg/dL — ABNORMAL HIGH (ref 0.0–1.0)
pH: 5.5 (ref 5.0–8.0)

## 2012-01-02 LAB — BASIC METABOLIC PANEL
BUN: 10 mg/dL (ref 6–23)
CO2: 23 mEq/L (ref 19–32)
Calcium: 8.1 mg/dL — ABNORMAL LOW (ref 8.4–10.5)
Chloride: 97 mEq/L (ref 96–112)
Creatinine, Ser: 0.48 mg/dL — ABNORMAL LOW (ref 0.50–1.10)
GFR calc Af Amer: >90 mL/min (ref >90)
GFR calc non Af Amer: >90 mL/min (ref >90)
Glucose, Bld: 126 mg/dL — ABNORMAL HIGH (ref 70–99)
Potassium: 2.7 mEq/L — CL (ref 3.5–5.1)
Sodium: 132 mEq/L — ABNORMAL LOW (ref 135–145)

## 2012-01-02 LAB — PREPARE RBC (CROSSMATCH)

## 2012-01-02 LAB — LACTIC ACID, PLASMA: Lactic Acid, Venous: 3.2 mmol/L — ABNORMAL HIGH (ref 0.5–2.2)

## 2012-01-02 LAB — URINE MICROSCOPIC-ADD ON

## 2012-01-02 LAB — ABO/RH: ABO/RH(D): O POS

## 2012-01-02 MED ORDER — LIDOCAINE HCL (PF) 2 % IJ SOLN
INTRAMUSCULAR | Status: AC
  Administered 2012-01-02: 10 mL
  Filled 2012-01-02: qty 30

## 2012-01-02 MED ORDER — POTASSIUM CHLORIDE IN NACL 20-0.9 MEQ/L-% IV SOLN: INTRAVENOUS | Status: DC

## 2012-01-02 MED ORDER — LEVOFLOXACIN 500 MG PO TABS
500.0000 mg | ORAL_TABLET | Freq: Every day | ORAL | Status: DC
  Administered 2012-01-02: 500 mg via ORAL
  Filled 2012-01-02: qty 1

## 2012-01-02 MED ORDER — POTASSIUM CHLORIDE 10 MEQ/100ML IV SOLN
10.0000 meq | INTRAVENOUS | Status: DC
  Administered 2012-01-03 (×3): 10 meq via INTRAVENOUS
  Filled 2012-01-02: qty 300

## 2012-01-02 MED ORDER — ONDANSETRON HCL 4 MG/2ML IJ SOLN: 4.0000 mg | Freq: Four times a day (QID) | INTRAMUSCULAR | Status: DC | PRN

## 2012-01-02 MED ORDER — LEVOFLOXACIN 500 MG PO TABS: 500.0000 mg | ORAL_TABLET | Freq: Every day | ORAL | Status: DC

## 2012-01-02 MED ORDER — ACETAMINOPHEN 500 MG PO TABS
500.0000 mg | ORAL_TABLET | Freq: Four times a day (QID) | ORAL | Status: DC | PRN
  Administered 2012-01-03 (×2): 500 mg via ORAL
  Filled 2012-01-02 (×3): qty 1

## 2012-01-02 MED ORDER — METRONIDAZOLE 500 MG PO TABS: 500.0000 mg | ORAL_TABLET | Freq: Two times a day (BID) | ORAL | Status: AC

## 2012-01-02 MED ORDER — ONDANSETRON HCL 4 MG PO TABS: 4.0000 mg | ORAL_TABLET | Freq: Four times a day (QID) | ORAL | Status: DC | PRN

## 2012-01-02 MED ORDER — OXYCODONE-ACETAMINOPHEN 5-325 MG PO TABS: 1.0000 | ORAL_TABLET | ORAL | Status: DC | PRN

## 2012-01-02 MED ORDER — DIPHENHYDRAMINE HCL 50 MG/ML IJ SOLN: 12.5000 mg | Freq: Four times a day (QID) | INTRAMUSCULAR | Status: DC | PRN

## 2012-01-02 MED ORDER — DIPHENHYDRAMINE HCL 12.5 MG/5ML PO ELIX: 12.5000 mg | ORAL_SOLUTION | Freq: Four times a day (QID) | ORAL | Status: DC | PRN

## 2012-01-02 MED ORDER — DOCUSATE SODIUM 100 MG PO CAPS
100.0000 mg | ORAL_CAPSULE | Freq: Two times a day (BID) | ORAL | Status: DC
  Filled 2012-01-02 (×2): qty 1

## 2012-01-02 MED ORDER — PIPERACILLIN-TAZOBACTAM 3.375 G IVPB
3.3750 g | Freq: Three times a day (TID) | INTRAVENOUS | Status: DC
  Administered 2012-01-03: 3.375 g via INTRAVENOUS
  Filled 2012-01-02 (×5): qty 50

## 2012-01-02 MED ORDER — VANCOMYCIN HCL IN DEXTROSE 1-5 GM/200ML-% IV SOLN: 1000.0000 mg | Freq: Once | INTRAVENOUS | Status: DC

## 2012-01-02 MED ORDER — HYDROMORPHONE 0.3 MG/ML IV SOLN: INTRAVENOUS | Status: DC

## 2012-01-02 MED ORDER — SODIUM CHLORIDE 0.9 % IV SOLN
1000.0000 mL | Freq: Once | INTRAVENOUS | Status: AC
  Administered 2012-01-02: 1000 mL via INTRAVENOUS

## 2012-01-02 MED ORDER — PIPERACILLIN-TAZOBACTAM 3.375 G IVPB
3.3750 g | Freq: Once | INTRAVENOUS | Status: AC
  Administered 2012-01-02: 21:00:00 via INTRAVENOUS
  Filled 2012-01-02: qty 50

## 2012-01-02 MED ORDER — PIPERACILLIN-TAZOBACTAM 3.375 G IVPB 30 MIN: 3.3750 g | Freq: Three times a day (TID) | INTRAVENOUS | Status: DC

## 2012-01-02 MED ORDER — SODIUM CHLORIDE 0.9 % IV SOLN
1000.0000 mL | INTRAVENOUS | Status: DC
  Administered 2012-01-02 – 2012-01-03 (×2): 1000 mL via INTRAVENOUS

## 2012-01-02 MED ORDER — POTASSIUM CHLORIDE 10 MEQ/100ML IV SOLN
INTRAVENOUS | Status: AC
  Administered 2012-01-02: 10 meq
  Filled 2012-01-02: qty 100

## 2012-01-02 MED ORDER — NALOXONE HCL 0.4 MG/ML IJ SOLN: 0.4000 mg | INTRAMUSCULAR | Status: DC | PRN

## 2012-01-02 MED ORDER — METRONIDAZOLE 500 MG PO TABS
500.0000 mg | ORAL_TABLET | Freq: Once | ORAL | Status: AC
  Administered 2012-01-02: 500 mg via ORAL
  Filled 2012-01-02: qty 1

## 2012-01-02 MED ORDER — SODIUM CHLORIDE 0.9 % IJ SOLN
9.0000 mL | INTRAMUSCULAR | Status: DC | PRN
  Filled 2012-01-02: qty 3

## 2012-01-02 MED ORDER — ACETAMINOPHEN 325 MG PO TABS
ORAL_TABLET | ORAL | Status: AC
  Administered 2012-01-02: 650 mg
  Filled 2012-01-02: qty 2

## 2012-01-02 NOTE — ED Notes (Signed)
MD at bedside. 

## 2012-01-02 NOTE — ED Provider Notes (Addendum)
History   This chart was scribed for Lyanne Co, MD by Sofie Rower, ED Scribe. The patient was seen in room [facility] and the patient's care was started at 7:48PM.     CSN: [id number]  Arrival date & time 01/02/12  1941   First MD Initiated Contact with Patient 01/02/12 1948      Chief Complaint  Patient presents with  . Vaginal Bleeding     The history is provided by the patient.    Kara Gentry is a 36 y.o. female , with a hx of C-section (Performed by Dr. Shawnie Pons on Nov. 2nd, 2013 at [facility]) and endometritis (diagosed on 01/01/12 at [facility]), who presents to the Emergency Department complaining ofprogressively worsening, vaginal bleeding, onset today (01/02/12). Associated symptoms include fever (103, taken at [facility]). The pt reports she recently had a C-section performed and delivered two babies on Nov. 2nd. 2013. One of the babies has recently passed away. The pt has taken a percocet, which provides relief of any pain associated with the vaginal bleeding.  She also reports increasing abdominal swelling.  Her heart rate on arrival was 152.  She reports the foul-smelling vaginal bleeding is new today.  She began about ex prescribed last night in both in the hospital last night as well as today at home.  Given the development of foul-smelling vaginal bleeding she presented the ER for evaluation.  The pt denies abdominal pain, shortness of breath, and light headedness   The pt does not smoke or drink alcohol.   PCP is Dr. Juanetta Gosling.    Past Medical History  Diagnosis Date  . Seasonal allergies   . No pertinent past medical history   . PONV (postoperative nausea and vomiting)     Past Surgical History  Procedure Date  . Ectopic pregnancy surgery   . Wrist surgery   . Mandible surgery cancer  . Nasal sinus surgery   . Cesarean section 12/22/2011    Procedure: CESAREAN SECTION;  Surgeon: Reva Bores, MD;  Location: [facility] ORS;  Service: Obstetrics;  Laterality: N/A;   Primary cesarean section with delivery of Baby A girl at 9. Baby B girl at 21.    Family History  Problem Relation Age of Onset  . Arthritis    . Cancer    . Diabetes      History  Substance Use Topics  . Smoking status: Never Smoker   . Smokeless tobacco: Never Used  . Alcohol Use: No    OB History    Grav Para Term Preterm Abortions TAB SAB Ect Mult Living   3 1  1 2  2  1 2       Review of Systems  All other systems reviewed and are negative.    Allergies  Review of patient's allergies indicates no known allergies.  Home Medications   Current Outpatient Rx  Name  Route  Sig  Dispense  Refill  . ACETAMINOPHEN 500 MG PO TABS   Oral   Take 500 mg by mouth every 6 (six) hours as needed. Takes for headache         . DIBUCAINE 1 % RE OINT   Rectal   Place 1 application rectally as needed.         Marland Kitchen FERROUS SULFATE 325 (65 FE) MG PO TABS   Oral   Take 1 tablet (325 mg total) by mouth 2 (two) times daily with a meal.   60 tablet   0   .  IBUPROFEN 200 MG PO TABS   Oral   Take 200 mg by mouth every 6 (six) hours as needed.         Marland Kitchen LANOLIN HYDROUS EX OINT   Topical   Apply 1 application topically as needed (for breast care).         Marland Kitchen LEVOFLOXACIN 500 MG PO TABS   Oral   Take 1 tablet (500 mg total) by mouth daily.   7 tablet   0   . METRONIDAZOLE 500 MG PO TABS   Oral   Take 1 tablet (500 mg total) by mouth 2 (two) times daily.   14 tablet   0   . OXYCODONE-ACETAMINOPHEN 5-325 MG PO TABS   Oral   Take 1-2 tablets by mouth every 4 (four) hours as needed (moderate - severe pain).   30 tablet   0   . WITCH HAZEL-GLYCERIN EX PADS   Topical   Apply 1 application topically as needed.   40 each        BP 117/72  Pulse 152  Temp 103 F (39.4 C) (Oral)  Resp 20  Ht 5\' 2"  (1.575 m)  Wt 149 lb (67.586 kg)  BMI 27.25 kg/m2  SpO2 100%  LMP 06/17/2011  Breastfeeding? Yes  Physical Exam  Nursing note and vitals  reviewed. Constitutional: She is oriented to person, place, and time. She appears well-developed and well-nourished. No distress.  HENT:  Head: Normocephalic and atraumatic.  Eyes: EOM are normal.  Neck: Normal range of motion.  Cardiovascular: Regular rhythm and normal heart sounds.        Tachycardic  Pulmonary/Chest: Effort normal and breath sounds normal.  Abdominal: Soft. She exhibits no distension. There is no tenderness.       Erythema surrounding mons pubis and surgical inscision up to infra umbilical area. One small punctate area of wound dehiscence with blood and pus draining from it.  Genitourinary:       Normal extremal genitalia. Small amount of gross blood in vaginal vault. Evidence of pus on gloved finger after bimanual exam.   Musculoskeletal: Normal range of motion.  Neurological: She is alert and oriented to person, place, and time.  Skin: Skin is warm and dry.  Psychiatric: She has a normal mood and affect. Judgment normal.    ED Course  Procedures (including critical care time)  DIAGNOSTIC STUDIES: Oxygen Saturation is 100% on room air, normal by my interpretation.    COORDINATION OF CARE:   8:03 PM- Treatment plan concerning IV antibiotics, blood work, and hospital admission discussed with patient. Pt agrees with treatment.   8:33PM- Phone consultation with Dr. Emelda Fear concerning pt condition, pt hx, application of antibiotics, and hospital admission. Dr. Emelda Fear agrees with treatment and hospital admission.   Labs Reviewed - No data to display No results found.   1. Endometritis      Date: 01/02/2012  Rate: 139  Rhythm: Sinus tachycardia  QRS Axis: normal  Intervals: normal  ST/T Wave abnormalities: normal  Conduction Disutrbances: none  Narrative Interpretation:   Old EKG Reviewed: Change from prior EKG     MDM  8:25 PM Contacting gyn at this time given concerns for worsening endometriosis.  Blood cultures sent.  Vancomycin and Zosyn now.     8:38 PM Spoke with Dr Emelda Fear. Will see the pt in the ER. Agrees with management thus far      I personally performed the services described in this documentation, which was scribed in my  presence. The recorded information has been reviewed and is accurate.      Lyanne Co, MD 01/02/12 2039  Lyanne Co, MD 01/02/12 [id number]

## 2012-01-02 NOTE — ED Notes (Signed)
Assumed care, pt fully alert and in no pain at this time. Drinking PO contrast slowly. Has been up to bedside commode and tolerated this well. Plan to keep pt in ED until CT is completed and accepting nurse avail

## 2012-01-02 NOTE — H&P (Signed)
Kara Gentry is an 36 y.o. female.  She is 11days s/p cesarean section for extreme premature twins, due to PPROM, and fetal malpresentation, who presented tonight to [facility] with drainage of bloody foul malodorous fluid from incision. No nausea or vomiting.Wound opened in ED sufficient to drain over 100 cc of bloody purulent fluid, and place a couple of 4/4's to ease egress of purulence. CT to be ordered to assess pelvis, and labs notable for anemia, Hgb 8, with plan to transfuse 2 units prbc in am prior to OR exploration of wound.  Pertinent Gynecological History: Menses: postpartum Bleeding: light postpartum bleeding Contraception: abstinence DES exposure: unknown Blood transfusions: two units later tonight Sexually transmitted diseases: no past history Previous GYN Procedures: cesarean 11 d ago Last mammogram:  Date:  Last pap:  Date:  OB History: G1 P0101  Menstrual History: Menarche age:  Patient's last menstrual period was 06/17/2011.    Past Medical History  Diagnosis Date  . Seasonal allergies   . No pertinent past medical history   . PONV (postoperative nausea and vomiting)     Past Surgical History  Procedure Date  . Ectopic pregnancy surgery   . Wrist surgery   . Mandible surgery cancer  . Nasal sinus surgery   . Cesarean section 12/22/2011    Procedure: CESAREAN SECTION;  Surgeon: Reva Bores, MD;  Location: [facility] ORS;  Service: Obstetrics;  Laterality: N/A;  Primary cesarean section with delivery of Baby A girl at 15. Baby B girl at 63.    Family History  Problem Relation Age of Onset  . Arthritis    . Cancer    . Diabetes      Social History:  reports that she has never smoked. She has never used smokeless tobacco. She reports that she does not drink alcohol or use illicit drugs.  Allergies: No Known Allergies   (Not in a hospital admission)  Review of Systems  Constitutional: Positive for fever and chills.  Respiratory: Negative for cough.     Gastrointestinal: Negative for nausea and vomiting.  Neurological: Negative.     Blood pressure 110/62, pulse 129, temperature 103 F (39.4 C), temperature source Oral, resp. rate 25, height 5\' 2"  (1.575 m), weight 149 lb (67.586 kg), last menstrual period 06/17/2011, SpO2 99.00%, currently breastfeeding. Physical Exam  Constitutional: She is oriented to person, place, and time. She appears well-developed and well-nourished.  HENT:  Head: Normocephalic and atraumatic.  Eyes: Pupils are equal, round, and reactive to light.  Neck: Normal range of motion. Neck supple.  Cardiovascular: Regular rhythm.        tachycardic  Respiratory: Effort normal and breath sounds normal. No respiratory distress.  GI: Soft. There is tenderness. There is guarding.       Swelling over lower 1/2 abdomen , below umbilicus, with cellulitis and 2 small skin blebs 2 mm size on the right  Side, inferior to umbilicus,   Genitourinary: Vagina normal.  Neurological: She is alert and oriented to person, place, and time.  Skin: Skin is warm. There is erythema.       Erythema below umbilicus   Psychiatric: She has a normal mood and affect.    Results for orders placed during the hospital encounter of 01/02/12 (from the past 24 hour(s))  TYPE AND SCREEN     Status: Normal   Collection Time   01/02/12  8:04 PM      Component Value Range   ABO/RH(D) O POS  Antibody Screen NEG     Sample Expiration 01/05/2012    LACTIC ACID, PLASMA     Status: Abnormal   Collection Time   01/02/12  8:04 PM      Component Value Range   Lactic Acid, Venous 3.2 (*) 0.5 - 2.2 mmol/L  CBC WITH DIFFERENTIAL     Status: Abnormal   Collection Time   01/02/12  8:26 PM      Component Value Range   WBC 16.7 (*) 4.0 - 10.5 K/uL   RBC 2.89 (*) 3.87 - 5.11 MIL/uL   Hemoglobin 8.1 (*) 12.0 - 15.0 g/dL   HCT 16.1 (*) 09.6 - 04.5 %   MCV 82.0  78.0 - 100.0 fL   MCH 28.0  26.0 - 34.0 pg   MCHC 34.2  30.0 - 36.0 g/dL   RDW 40.9  81.1 -  91.4 %   Platelets 631 (*) 150 - 400 K/uL   Neutrophils Relative 94 (*) 43 - 77 %   Neutro Abs 15.6 (*) 1.7 - 7.7 K/uL   Lymphocytes Relative 4 (*) 12 - 46 %   Lymphs Abs 0.7  0.7 - 4.0 K/uL   Monocytes Relative 2 (*) 3 - 12 %   Monocytes Absolute 0.3  0.1 - 1.0 K/uL   Eosinophils Relative 0  0 - 5 %   Eosinophils Absolute 0.0  0.0 - 0.7 K/uL   Basophils Relative 0  0 - 1 %   Basophils Absolute 0.0  0.0 - 0.1 K/uL  BASIC METABOLIC PANEL     Status: Abnormal   Collection Time   01/02/12  8:26 PM      Component Value Range   Sodium 132 (*) 135 - 145 mEq/L   Potassium 2.7 (*) 3.5 - 5.1 mEq/L   Chloride 97  96 - 112 mEq/L   CO2 23  19 - 32 mEq/L   Glucose, Bld 126 (*) 70 - 99 mg/dL   BUN 10  6 - 23 mg/dL   Creatinine, Ser 7.82 (*) 0.50 - 1.10 mg/dL   Calcium 8.1 (*) 8.4 - 10.5 mg/dL   GFR calc non Af Amer >90  >90 mL/min   GFR calc Af Amer >90  >90 mL/min    No results found.  Assessment/Plan: Wound hematoma with abscess, s/p cesarean section Anemia Hypokalemia, Plan:  CT abdomen tonight         IV zosyn          Transfuse 2 units prbc in a.m          NPO p midnight for opening and evacuation of wound  Midday tomorrow          Dilaudid PCA  Kara Gentry V 01/02/2012, 10:19 PM

## 2012-01-02 NOTE — MAU Provider Note (Signed)
History     CSN: [id number]  Arrival date and time: 01/01/12 2239   First Provider Initiated Contact with Patient 01/02/12 0012      No chief complaint on file.  HPI 36 y.o. [id number] at PPD #11 with fever, abdominal pain x 1 week, worsening. Seen 2 days after discharge with L sided pain and constipation which was improving somewhat. States she had low-grade fever last week (4-5 days ago) but did not call her OB. Fever was 101.4 at home today and she feels her abdomen is swollen. Pain is now bilateral pelvic. Has not had a good BM despite enema. But no diarrhea, nausea or vomiting and is passing flatus. No dysuria.   Pt is pumping breastmilk but has ben dumping due to stool softener and enema. Her baby is in NICU at this time.  OB History    Grav Para Term Preterm Abortions TAB SAB Ect Mult Living   3 1  1 2  2  1 2       Past Medical History  Diagnosis Date  . Seasonal allergies   . No pertinent past medical history   . PONV (postoperative nausea and vomiting)     Past Surgical History  Procedure Date  . Ectopic pregnancy surgery   . Wrist surgery   . Mandible surgery cancer  . Nasal sinus surgery   . Cesarean section 12/22/2011    Procedure: CESAREAN SECTION;  Surgeon: Reva Bores, MD;  Location: [facility] ORS;  Service: Obstetrics;  Laterality: N/A;  Primary cesarean section with delivery of Baby A girl at 72. Baby B girl at 43.    Family History  Problem Relation Age of Onset  . Arthritis    . Cancer    . Diabetes      History  Substance Use Topics  . Smoking status: Never Smoker   . Smokeless tobacco: Never Used  . Alcohol Use: No    Allergies: No Known Allergies  Prescriptions prior to admission  Medication Sig Dispense Refill  . acetaminophen (TYLENOL) 500 MG tablet Take 500 mg by mouth every 6 (six) hours as needed. Takes for headache      . dibucaine (NUPERCAINAL) 1 % OINT Place 1 application rectally as needed.      . ferrous sulfate 325 (65 FE) MG  tablet Take 1 tablet (325 mg total) by mouth 2 (two) times daily with a meal.  60 tablet  0  . ibuprofen (ADVIL,MOTRIN) 200 MG tablet Take 200 mg by mouth every 6 (six) hours as needed.      Marland Kitchen oxyCODONE-acetaminophen (PERCOCET/ROXICET) 5-325 MG per tablet Take 1-2 tablets by mouth every 4 (four) hours as needed (moderate - severe pain).  40 tablet  0  . lanolin OINT Apply 1 application topically as needed (for breast care).      Marland Kitchen witch hazel-glycerin (TUCKS) pad Apply 1 application topically as needed.  40 each      Review of Systems  Constitutional: Positive for fever, chills and malaise/fatigue.  Eyes: Negative for blurred vision and double vision.  Gastrointestinal: Positive for abdominal pain and constipation. Negative for nausea, vomiting and diarrhea.       No drainage from incision  Genitourinary: Negative for dysuria, urgency and frequency.       Vaginal bleeding as expected.   Neurological: Negative for headaches.   Physical Exam   Blood pressure 102/68, pulse 113, temperature 100.8 F (38.2 C), temperature source Oral, resp. rate 20, last menstrual period 06/17/2011,  unknown if currently breastfeeding.  Physical Exam  Constitutional: She is oriented to person, place, and time. She appears well-developed and well-nourished. No distress.  HENT:  Head: Normocephalic and atraumatic.  Eyes: Conjunctivae normal and EOM are normal.  Neck: Normal range of motion. Neck supple.  Cardiovascular: Regular rhythm and normal heart sounds.        Tachycardic  Respiratory: Effort normal and breath sounds normal. No respiratory distress.  GI:       Mild distention - fundus at umbilicus, firm. Fundal tenderness and mild diffuse tenderness. Incision clean, dry, intact. Scattered erythematous areas on abdomen.   Musculoskeletal: Normal range of motion. She exhibits no edema and no tenderness.  Neurological: She is alert and oriented to person, place, and time.  Skin: Skin is warm and dry.    Psychiatric: She has a normal mood and affect.   Results for orders placed during the hospital encounter of 01/01/12 (from the past 24 hour(s))  URINALYSIS, ROUTINE W REFLEX MICROSCOPIC     Status: Abnormal   Collection Time   01/01/12 11:00 PM      Component Value Range   Color, Urine YELLOW  YELLOW   APPearance HAZY (*) CLEAR   Specific Gravity, Urine 1.025  1.005 - 1.030   pH 5.5  5.0 - 8.0   Glucose, UA NEGATIVE  NEGATIVE mg/dL   Hgb urine dipstick LARGE (*) NEGATIVE   Bilirubin Urine NEGATIVE  NEGATIVE   Ketones, ur NEGATIVE  NEGATIVE mg/dL   Protein, ur 30 (*) NEGATIVE mg/dL   Urobilinogen, UA 4.0 (*) 0.0 - 1.0 mg/dL   Nitrite NEGATIVE  NEGATIVE   Leukocytes, UA TRACE (*) NEGATIVE  URINE MICROSCOPIC-ADD ON     Status: Normal   Collection Time   01/01/12 11:00 PM      Component Value Range   Squamous Epithelial / LPF RARE  RARE   WBC, UA 0-2  <3 WBC/hpf   RBC / HPF 0-2  <3 RBC/hpf   Bacteria, UA RARE  RARE   Urine-Other MUCOUS PRESENT    CBC WITH DIFFERENTIAL     Status: Abnormal   Collection Time   01/01/12 11:30 PM      Component Value Range   WBC 20.6 (*) 4.0 - 10.5 K/uL   RBC 2.41 (*) 3.87 - 5.11 MIL/uL   Hemoglobin 6.7 (*) 12.0 - 15.0 g/dL   HCT 16.1 (*) 09.6 - 04.5 %   MCV 82.2  78.0 - 100.0 fL   MCH 27.8  26.0 - 34.0 pg   MCHC 33.8  30.0 - 36.0 g/dL   RDW 40.9  81.1 - 91.4 %   Platelets 520 (*) 150 - 400 K/uL   Neutrophils Relative 88 (*) 43 - 77 %   Lymphocytes Relative 10 (*) 12 - 46 %   Monocytes Relative 2 (*) 3 - 12 %   Eosinophils Relative 0  0 - 5 %   Basophils Relative 0  0 - 1 %   Band Neutrophils 0  0 - 10 %   Metamyelocytes Relative 0     Myelocytes 0     Promyelocytes Absolute 0     Blasts 0     nRBC 0  0 /100 WBC   Neutro Abs 18.1 (*) 1.7 - 7.7 K/uL   Lymphs Abs 2.1  0.7 - 4.0 K/uL   Monocytes Absolute 0.4  0.1 - 1.0 K/uL   Eosinophils Absolute 0.0  0.0 - 0.7 K/uL   Basophils Absolute  0.0  0.0 - 0.1 K/uL    MAU Course   Procedures  MDM   Assessment and Plan  36 y.o. [id number] at 11 days postpartum with - Endometritis:   Levaquin + flagyl x 7 days - Percocet for pain, motrin for fever - F/U with [facility] in 1-2 days - Return if unable to tolerate PO or worsening pain/fever.  Napoleon Form 01/02/2012, 12:27 AM

## 2012-01-02 NOTE — ED Notes (Signed)
Pt with swelling, bleeding and very foul odor coming from c-section incision, was seen at [facility] last night

## 2012-01-02 NOTE — ED Notes (Signed)
MD at bedside.- Emelda Fear at bedside

## 2012-01-02 NOTE — ED Notes (Addendum)
Seen at [facility] hospital last night s/p c -section 11/2.  For fever and infection, Now having bleeding from c  Section site with odor and probable infection.  Lower abd hot to touch.   Pt had twins, 1 died, the other still  In hospital

## 2012-01-02 NOTE — ED Notes (Signed)
Attempted to call report, nurse doing a procedure in another pt's room and will call back for report

## 2012-01-03 ENCOUNTER — Encounter (HOSPITAL_COMMUNITY): Payer: Self-pay | Admitting: Intensive Care

## 2012-01-03 ENCOUNTER — Encounter (HOSPITAL_COMMUNITY)
Admission: EM | Disposition: A | Discharge status: Home / Self Care | Payer: Self-pay | Source: Home / Self Care | Attending: Obstetrics and Gynecology

## 2012-01-03 ENCOUNTER — Encounter (HOSPITAL_COMMUNITY): Payer: Self-pay | Admitting: Certified Registered"

## 2012-01-03 ENCOUNTER — Inpatient Hospital Stay (HOSPITAL_COMMUNITY): Payer: BC Managed Care – PPO | Admitting: Certified Registered"

## 2012-01-03 DIAGNOSIS — M726 Necrotizing fasciitis: Secondary | ICD-10-CM | POA: Diagnosis present

## 2012-01-03 HISTORY — PX: WOUND EXPLORATION: SHX6188

## 2012-01-03 HISTORY — PX: APPLICATION OF WOUND VAC: SHX5189

## 2012-01-03 HISTORY — PX: INSERTION OF MESH: SHX5868

## 2012-01-03 LAB — CBC WITH DIFFERENTIAL/PLATELET
Basophils Absolute: 0.1 10*3/uL (ref 0.0–0.1)
Basophils Absolute: 0 10*3/uL (ref 0.0–0.1)
Basophils Relative: 0 % (ref 0–1)
Basophils Relative: 0 % (ref 0–1)
Eosinophils Absolute: 0 10*3/uL (ref 0.0–0.7)
Eosinophils Absolute: 0 10*3/uL (ref 0.0–0.7)
Eosinophils Relative: 0 % (ref 0–5)
Eosinophils Relative: 0 % (ref 0–5)
HCT: 24 % — ABNORMAL LOW (ref 36.0–46.0)
HCT: 32.4 % — ABNORMAL LOW (ref 36.0–46.0)
Hemoglobin: 8.3 g/dL — ABNORMAL LOW (ref 12.0–15.0)
Hemoglobin: 11.6 g/dL — ABNORMAL LOW (ref 12.0–15.0)
Lymphocytes Relative: 6 % — ABNORMAL LOW (ref 12–46)
Lymphocytes Relative: 5 % — ABNORMAL LOW (ref 12–46)
Lymphs Abs: 1 10*3/uL (ref 0.7–4.0)
Lymphs Abs: 0.9 10*3/uL (ref 0.7–4.0)
MCH: 28.6 pg (ref 26.0–34.0)
MCH: 29.3 pg (ref 26.0–34.0)
MCHC: 34.6 g/dL (ref 30.0–36.0)
MCHC: 35.8 g/dL (ref 30.0–36.0)
MCV: 82.8 fL (ref 78.0–100.0)
MCV: 81.8 fL (ref 78.0–100.0)
Monocytes Absolute: 0.2 10*3/uL (ref 0.1–1.0)
Monocytes Absolute: 0.2 10*3/uL (ref 0.1–1.0)
Monocytes Relative: 1 % — ABNORMAL LOW (ref 3–12)
Monocytes Relative: 1 % — ABNORMAL LOW (ref 3–12)
Neutro Abs: 16.1 10*3/uL — ABNORMAL HIGH (ref 1.7–7.7)
Neutro Abs: 17 10*3/uL — ABNORMAL HIGH (ref 1.7–7.7)
Neutrophils Relative %: 93 % — ABNORMAL HIGH (ref 43–77)
Neutrophils Relative %: 94 % — ABNORMAL HIGH (ref 43–77)
Platelets: 541 10*3/uL — ABNORMAL HIGH (ref 150–400)
Platelets: 452 10*3/uL — ABNORMAL HIGH (ref 150–400)
RBC: 2.9 MIL/uL — ABNORMAL LOW (ref 3.87–5.11)
RBC: 3.96 MIL/uL (ref 3.87–5.11)
RDW: 14.4 % (ref 11.5–15.5)
RDW: 14.2 % (ref 11.5–15.5)
WBC Morphology: INCREASED
WBC: 17.4 10*3/uL — ABNORMAL HIGH (ref 4.0–10.5)
WBC: 18.1 10*3/uL — ABNORMAL HIGH (ref 4.0–10.5)

## 2012-01-03 LAB — BASIC METABOLIC PANEL
BUN: 7 mg/dL (ref 6–23)
BUN: 6 mg/dL (ref 6–23)
CO2: 23 mEq/L (ref 19–32)
CO2: 24 mEq/L (ref 19–32)
Calcium: 7.4 mg/dL — ABNORMAL LOW (ref 8.4–10.5)
Calcium: 7.2 mg/dL — ABNORMAL LOW (ref 8.4–10.5)
Chloride: 104 mEq/L (ref 96–112)
Chloride: 104 mEq/L (ref 96–112)
Creatinine, Ser: 0.46 mg/dL — ABNORMAL LOW (ref 0.50–1.10)
Creatinine, Ser: 0.5 mg/dL (ref 0.50–1.10)
GFR calc Af Amer: >90 mL/min (ref >90)
GFR calc Af Amer: >90 mL/min (ref >90)
GFR calc non Af Amer: >90 mL/min (ref >90)
GFR calc non Af Amer: >90 mL/min (ref >90)
Glucose, Bld: 95 mg/dL (ref 70–99)
Glucose, Bld: 96 mg/dL (ref 70–99)
Potassium: 3.3 mEq/L — ABNORMAL LOW (ref 3.5–5.1)
Potassium: 3.8 mEq/L (ref 3.5–5.1)
Sodium: 137 mEq/L (ref 135–145)
Sodium: 137 mEq/L (ref 135–145)

## 2012-01-03 LAB — PREPARE RBC (CROSSMATCH)

## 2012-01-03 LAB — SEDIMENTATION RATE: Sed Rate: 82 mm/hr — ABNORMAL HIGH (ref 0–22)

## 2012-01-03 LAB — MRSA PCR SCREENING: MRSA by PCR: NEGATIVE

## 2012-01-03 SURGERY — WOUND EXPLORATION
Anesthesia: General | Site: Abdomen | Wound class: Contaminated

## 2012-01-03 MED ORDER — ONDANSETRON HCL 4 MG/2ML IJ SOLN
INTRAMUSCULAR | Status: DC | PRN
  Administered 2012-01-03: 4 mg via INTRAVENOUS

## 2012-01-03 MED ORDER — LACTATED RINGERS IV SOLN: INTRAVENOUS | Status: DC

## 2012-01-03 MED ORDER — MIDAZOLAM HCL 2 MG/2ML IJ SOLN
1.0000 mg | INTRAMUSCULAR | Status: DC | PRN
  Administered 2012-01-03: 2 mg via INTRAVENOUS

## 2012-01-03 MED ORDER — SODIUM CHLORIDE 0.9 % IJ SOLN: 9.0000 mL | INTRAMUSCULAR | Status: DC | PRN

## 2012-01-03 MED ORDER — GLYCOPYRROLATE 0.2 MG/ML IJ SOLN
INTRAMUSCULAR | Status: AC
  Filled 2012-01-03: qty 3

## 2012-01-03 MED ORDER — ONDANSETRON HCL 4 MG/2ML IJ SOLN
4.0000 mg | Freq: Once | INTRAMUSCULAR | Status: AC
  Administered 2012-01-03: 4 mg via INTRAVENOUS

## 2012-01-03 MED ORDER — PIPERACILLIN-TAZOBACTAM 3.375 G IVPB
INTRAVENOUS | Status: AC
  Filled 2012-01-03: qty 50

## 2012-01-03 MED ORDER — ETOMIDATE 2 MG/ML IV SOLN
INTRAVENOUS | Status: DC | PRN
  Administered 2012-01-03: 12 mg via INTRAVENOUS

## 2012-01-03 MED ORDER — FENTANYL CITRATE 0.05 MG/ML IJ SOLN
INTRAMUSCULAR | Status: AC
  Filled 2012-01-03: qty 2

## 2012-01-03 MED ORDER — NEOSTIGMINE METHYLSULFATE 1 MG/ML IJ SOLN
INTRAMUSCULAR | Status: AC
  Filled 2012-01-03: qty 10

## 2012-01-03 MED ORDER — ETOMIDATE 2 MG/ML IV SOLN
INTRAVENOUS | Status: AC
  Filled 2012-01-03: qty 10

## 2012-01-03 MED ORDER — ONDANSETRON HCL 4 MG/2ML IJ SOLN
INTRAMUSCULAR | Status: AC
  Filled 2012-01-03: qty 2

## 2012-01-03 MED ORDER — FENTANYL CITRATE 0.05 MG/ML IJ SOLN
INTRAMUSCULAR | Status: AC
  Filled 2012-01-03: qty 5

## 2012-01-03 MED ORDER — CEFAZOLIN SODIUM-DEXTROSE 2-3 GM-% IV SOLR
INTRAVENOUS | Status: AC
  Filled 2012-01-03: qty 50

## 2012-01-03 MED ORDER — POTASSIUM CHLORIDE 10 MEQ/100ML IV SOLN
10.0000 meq | INTRAVENOUS | Status: AC
  Administered 2012-01-03 (×2): 10 meq via INTRAVENOUS
  Filled 2012-01-03: qty 200

## 2012-01-03 MED ORDER — LIDOCAINE HCL 1 % IJ SOLN
INTRAMUSCULAR | Status: DC | PRN
  Administered 2012-01-03: 50 mg via INTRADERMAL

## 2012-01-03 MED ORDER — HYDROMORPHONE 0.3 MG/ML IV SOLN
INTRAVENOUS | Status: DC
  Administered 2012-01-03: 16:00:00 via INTRAVENOUS
  Administered 2012-01-04: 0.3 mg via INTRAVENOUS
  Administered 2012-01-04: 0.6 mg via INTRAVENOUS
  Administered 2012-01-04 (×2): 0.3 mg via INTRAVENOUS
  Administered 2012-01-05: 14:00:00 via INTRAVENOUS
  Administered 2012-01-05: 0.3 mg via INTRAVENOUS
  Administered 2012-01-05: 0.7 mg via INTRAVENOUS
  Administered 2012-01-05: 0.6 mg via INTRAVENOUS
  Administered 2012-01-05: 3.9 mg via INTRAVENOUS
  Administered 2012-01-06 (×2): 0.7 mg via INTRAVENOUS
  Administered 2012-01-06: 2.1 mg via INTRAVENOUS
  Administered 2012-01-06: 0.6 mg via INTRAVENOUS
  Administered 2012-01-06: 08:00:00 via INTRAVENOUS
  Administered 2012-01-06: 1.2 mg via INTRAVENOUS
  Administered 2012-01-07: 6 mg via INTRAVENOUS
  Administered 2012-01-07: 0.3 mg via INTRAVENOUS
  Administered 2012-01-07: 1.5 mg via INTRAVENOUS
  Administered 2012-01-07: 0.6 mg via INTRAVENOUS
  Administered 2012-01-08 (×2): 0.3 mg via INTRAVENOUS
  Administered 2012-01-08: 0.9 mg via INTRAVENOUS
  Administered 2012-01-08: 1.8 mg via INTRAVENOUS
  Administered 2012-01-08: 03:00:00 via INTRAVENOUS
  Administered 2012-01-08: 0.3 mg via INTRAVENOUS
  Administered 2012-01-09: 1.2 mg via INTRAVENOUS
  Administered 2012-01-09: 3 mg via INTRAVENOUS
  Administered 2012-01-09: 0.6 mg via INTRAVENOUS
  Administered 2012-01-09: 1.8 mg via INTRAVENOUS
  Administered 2012-01-09: 04:00:00 via INTRAVENOUS
  Administered 2012-01-10: 0.3 mg via INTRAVENOUS
  Administered 2012-01-10: 0.6 mg via INTRAVENOUS
  Administered 2012-01-10: 0.3 mg via INTRAVENOUS
  Administered 2012-01-11: 0.8 mg via INTRAVENOUS
  Administered 2012-01-11: 01:00:00 via INTRAVENOUS
  Administered 2012-01-11: 0.8 mg via INTRAVENOUS
  Administered 2012-01-12: 0.3 mg via INTRAVENOUS
  Administered 2012-01-12: 0.6 mg via INTRAVENOUS
  Administered 2012-01-12: 22:00:00 via INTRAVENOUS
  Administered 2012-01-12: 0.6 mg via INTRAVENOUS
  Administered 2012-01-13: 0.3 mg via INTRAVENOUS
  Filled 2012-01-03 (×7): qty 25

## 2012-01-03 MED ORDER — DEXAMETHASONE SODIUM PHOSPHATE 4 MG/ML IJ SOLN
INTRAMUSCULAR | Status: DC | PRN
  Administered 2012-01-03: 4 mg via INTRAVENOUS

## 2012-01-03 MED ORDER — DEXAMETHASONE SODIUM PHOSPHATE 4 MG/ML IJ SOLN
INTRAMUSCULAR | Status: AC
  Filled 2012-01-03: qty 1

## 2012-01-03 MED ORDER — CLINDAMYCIN PHOSPHATE 900 MG/50ML IV SOLN
900.0000 mg | Freq: Three times a day (TID) | INTRAVENOUS | Status: DC
  Administered 2012-01-03 – 2012-01-14 (×33): 900 mg via INTRAVENOUS
  Filled 2012-01-03 (×37): qty 50

## 2012-01-03 MED ORDER — 0.9 % SODIUM CHLORIDE (POUR BTL) OPTIME
TOPICAL | Status: DC | PRN
  Administered 2012-01-03: 2000 mL

## 2012-01-03 MED ORDER — KETOROLAC TROMETHAMINE 30 MG/ML IJ SOLN
30.0000 mg | Freq: Four times a day (QID) | INTRAMUSCULAR | Status: AC
  Administered 2012-01-04 (×5): 30 mg via INTRAVENOUS
  Filled 2012-01-03 (×4): qty 1

## 2012-01-03 MED ORDER — DEXAMETHASONE SODIUM PHOSPHATE 4 MG/ML IJ SOLN: 4.0000 mg | Freq: Once | INTRAMUSCULAR | Status: DC

## 2012-01-03 MED ORDER — SUCCINYLCHOLINE CHLORIDE 20 MG/ML IJ SOLN
INTRAMUSCULAR | Status: AC
  Filled 2012-01-03: qty 1

## 2012-01-03 MED ORDER — SODIUM CHLORIDE 0.9 % IV SOLN: 1000.0000 mL | INTRAVENOUS | Status: DC

## 2012-01-03 MED ORDER — ROCURONIUM BROMIDE 50 MG/5ML IV SOLN
INTRAVENOUS | Status: AC
  Filled 2012-01-03: qty 2

## 2012-01-03 MED ORDER — DIPHENHYDRAMINE HCL 12.5 MG/5ML PO ELIX: 12.5000 mg | ORAL_SOLUTION | Freq: Four times a day (QID) | ORAL | Status: DC | PRN

## 2012-01-03 MED ORDER — FENTANYL CITRATE 0.05 MG/ML IJ SOLN
25.0000 ug | INTRAMUSCULAR | Status: DC | PRN
  Administered 2012-01-03 (×2): 50 ug via INTRAVENOUS

## 2012-01-03 MED ORDER — SUCCINYLCHOLINE CHLORIDE 20 MG/ML IJ SOLN
INTRAMUSCULAR | Status: DC | PRN
  Administered 2012-01-03: 100 mg via INTRAVENOUS

## 2012-01-03 MED ORDER — KETOROLAC TROMETHAMINE 30 MG/ML IJ SOLN
INTRAMUSCULAR | Status: AC
  Filled 2012-01-03: qty 1

## 2012-01-03 MED ORDER — ONDANSETRON HCL 4 MG/2ML IJ SOLN: 4.0000 mg | Freq: Four times a day (QID) | INTRAMUSCULAR | Status: DC | PRN

## 2012-01-03 MED ORDER — SCOPOLAMINE 1 MG/3DAYS TD PT72
MEDICATED_PATCH | TRANSDERMAL | Status: AC
  Filled 2012-01-03: qty 1

## 2012-01-03 MED ORDER — SCOPOLAMINE 1 MG/3DAYS TD PT72
1.0000 | MEDICATED_PATCH | Freq: Once | TRANSDERMAL | Status: DC
  Administered 2012-01-03: 1.5 mg via TRANSDERMAL

## 2012-01-03 MED ORDER — IOHEXOL 300 MG/ML  SOLN
100.0000 mL | Freq: Once | INTRAMUSCULAR | Status: AC | PRN
  Administered 2012-01-03: 100 mL via INTRAVENOUS

## 2012-01-03 MED ORDER — ONDANSETRON HCL 4 MG/2ML IJ SOLN: 4.0000 mg | Freq: Once | INTRAMUSCULAR | Status: DC | PRN

## 2012-01-03 MED ORDER — NALOXONE HCL 0.4 MG/ML IJ SOLN: 0.4000 mg | INTRAMUSCULAR | Status: DC | PRN

## 2012-01-03 MED ORDER — ACETAMINOPHEN 10 MG/ML IV SOLN
INTRAVENOUS | Status: AC
  Filled 2012-01-03: qty 100

## 2012-01-03 MED ORDER — GLYCOPYRROLATE 0.2 MG/ML IJ SOLN
INTRAMUSCULAR | Status: DC | PRN
  Administered 2012-01-03: .6 mg via INTRAVENOUS

## 2012-01-03 MED ORDER — KETOROLAC TROMETHAMINE 30 MG/ML IJ SOLN: 30.0000 mg | Freq: Four times a day (QID) | INTRAMUSCULAR | Status: AC

## 2012-01-03 MED ORDER — FENTANYL CITRATE 0.05 MG/ML IJ SOLN
INTRAMUSCULAR | Status: DC | PRN
  Administered 2012-01-03 (×2): 50 ug via INTRAVENOUS
  Administered 2012-01-03: 250 ug via INTRAVENOUS

## 2012-01-03 MED ORDER — LACTATED RINGERS IV SOLN
INTRAVENOUS | Status: DC | PRN
  Administered 2012-01-03: 11:00:00 via INTRAVENOUS

## 2012-01-03 MED ORDER — LIDOCAINE HCL (PF) 1 % IJ SOLN
INTRAMUSCULAR | Status: AC
  Filled 2012-01-03: qty 5

## 2012-01-03 MED ORDER — PIPERACILLIN-TAZOBACTAM 3.375 G IVPB 30 MIN
3.3750 g | Freq: Four times a day (QID) | INTRAVENOUS | Status: DC
  Filled 2012-01-03 (×4): qty 50

## 2012-01-03 MED ORDER — DIPHENHYDRAMINE HCL 50 MG/ML IJ SOLN: 12.5000 mg | Freq: Four times a day (QID) | INTRAMUSCULAR | Status: DC | PRN

## 2012-01-03 MED ORDER — NEOSTIGMINE METHYLSULFATE 1 MG/ML IJ SOLN
INTRAMUSCULAR | Status: DC | PRN
  Administered 2012-01-03: 4 mg via INTRAVENOUS

## 2012-01-03 MED ORDER — PROPOFOL 10 MG/ML IV EMUL
INTRAVENOUS | Status: DC | PRN
  Administered 2012-01-03: 30 mg via INTRAVENOUS

## 2012-01-03 MED ORDER — ROCURONIUM BROMIDE 100 MG/10ML IV SOLN
INTRAVENOUS | Status: DC | PRN
  Administered 2012-01-03: 5 mg via INTRAVENOUS
  Administered 2012-01-03: 40 mg via INTRAVENOUS
  Administered 2012-01-03: 10 mg via INTRAVENOUS
  Administered 2012-01-03 (×2): 5 mg via INTRAVENOUS
  Administered 2012-01-03: 10 mg via INTRAVENOUS

## 2012-01-03 MED ORDER — ACETAMINOPHEN 10 MG/ML IV SOLN
1000.0000 mg | Freq: Once | INTRAVENOUS | Status: AC
  Administered 2012-01-03: 1000 mg via INTRAVENOUS

## 2012-01-03 MED ORDER — PIPERACILLIN-TAZOBACTAM 3.375 G IVPB
3.3750 g | Freq: Three times a day (TID) | INTRAVENOUS | Status: DC
  Administered 2012-01-04 – 2012-01-14 (×30): 3.375 g via INTRAVENOUS
  Filled 2012-01-03 (×38): qty 50

## 2012-01-03 MED ORDER — POTASSIUM CHLORIDE IN NACL 20-0.9 MEQ/L-% IV SOLN
INTRAVENOUS | Status: DC
  Administered 2012-01-03: 16:00:00 via INTRAVENOUS
  Administered 2012-01-04: 1000 mL via INTRAVENOUS
  Administered 2012-01-04: 20:00:00 via INTRAVENOUS

## 2012-01-03 MED ORDER — 0.9 % SODIUM CHLORIDE (POUR BTL) OPTIME
TOPICAL | Status: DC | PRN
  Administered 2012-01-03: 1000 mL

## 2012-01-03 MED ORDER — KETOROLAC TROMETHAMINE 30 MG/ML IJ SOLN
30.0000 mg | Freq: Once | INTRAMUSCULAR | Status: AC
  Administered 2012-01-03: 30 mg via INTRAVENOUS

## 2012-01-03 MED ORDER — MIDAZOLAM HCL 2 MG/2ML IJ SOLN
INTRAMUSCULAR | Status: AC
  Filled 2012-01-03: qty 2

## 2012-01-03 SURGICAL SUPPLY — 47 items
BAG HAMPER (MISCELLANEOUS) ×1 IMPLANT
CANISTER WOUND CARE 500ML ATS (WOUND CARE) ×1 IMPLANT
CLOTH BEACON ORANGE TIMEOUT ST (SAFETY) ×1 IMPLANT
COVER LIGHT HANDLE STERIS (MISCELLANEOUS) ×2 IMPLANT
DRAPE WARM FLUID 44X44 (DRAPE) ×1 IMPLANT
DRSG VAC ATS MED SENSATRAC (GAUZE/BANDAGES/DRESSINGS) ×1 IMPLANT
ELECT REM PT RETURN 9FT ADLT (ELECTROSURGICAL) ×2
ELECTRODE REM PT RTRN 9FT ADLT (ELECTROSURGICAL) IMPLANT
EVACUATOR DRAINAGE 10X20 100CC (DRAIN) IMPLANT
EVACUATOR SILICONE 100CC (DRAIN) ×2
GAUZE PACKING 2X5 YD STERILE (GAUZE/BANDAGES/DRESSINGS) IMPLANT
GLOVE BIOGEL PI IND STRL 7.0 (GLOVE) IMPLANT
GLOVE BIOGEL PI IND STRL 7.5 (GLOVE) IMPLANT
GLOVE BIOGEL PI INDICATOR 7.0 (GLOVE) ×2
GLOVE BIOGEL PI INDICATOR 7.5 (GLOVE) ×1
GLOVE ECLIPSE 6.5 STRL STRAW (GLOVE) ×1 IMPLANT
GLOVE ECLIPSE 7.0 STRL STRAW (GLOVE) ×1 IMPLANT
GLOVE ECLIPSE 9.0 STRL (GLOVE) ×1 IMPLANT
GLOVE EXAM NITRILE MD LF STRL (GLOVE) ×2 IMPLANT
GLOVE INDICATOR STER SZ 9 (GLOVE) ×1 IMPLANT
GLOVE SS BIOGEL STRL SZ 6.5 (GLOVE) IMPLANT
GLOVE SUPERSENSE BIOGEL SZ 6.5 (GLOVE) ×1
GOWN STRL REIN 3XL LVL4 (GOWN DISPOSABLE) ×1 IMPLANT
GOWN STRL REIN XL XLG (GOWN DISPOSABLE) ×3 IMPLANT
GRAFT FLEX HD 10X16 THICK (Tissue Mesh) ×1 IMPLANT
INST SET MAJOR GENERAL (KITS) ×1 IMPLANT
KIT ROOM TURNOVER APOR (KITS) ×1 IMPLANT
MANIFOLD NEPTUNE II (INSTRUMENTS) ×1 IMPLANT
NDL HYPO 21X1.5 SAFETY (NEEDLE) IMPLANT
NEEDLE HYPO 21X1.5 SAFETY (NEEDLE) ×2 IMPLANT
NS IRRIG 1000ML POUR BTL (IV SOLUTION) ×2 IMPLANT
PACK ABDOMINAL MAJOR (CUSTOM PROCEDURE TRAY) ×1 IMPLANT
PAD ABD 5X9 TENDERSORB (GAUZE/BANDAGES/DRESSINGS) ×1 IMPLANT
PAD ARMBOARD 7.5X6 YLW CONV (MISCELLANEOUS) ×1 IMPLANT
SET BASIN LINEN APH (SET/KITS/TRAYS/PACK) ×1 IMPLANT
SOL PREP PROV IODINE SCRUB 4OZ (MISCELLANEOUS) ×1 IMPLANT
SPONGE DRAIN TRACH 4X4 STRL 2S (GAUZE/BANDAGES/DRESSINGS) ×2 IMPLANT
SPONGE GAUZE 4X4 12PLY (GAUZE/BANDAGES/DRESSINGS) ×1 IMPLANT
SPONGE LAP 18X18 X RAY DECT (DISPOSABLE) ×2 IMPLANT
SUCTION POOLE TIP (SUCTIONS) ×1 IMPLANT
SUT ETHILON 3 0 FSL (SUTURE) ×1 IMPLANT
SUT PROLENE 0 CT 1 30 (SUTURE) IMPLANT
SUT PROLENE 2 0 SH 30 (SUTURE) ×3 IMPLANT
SUT SILK 3 0 SH CR/8 (SUTURE) ×1 IMPLANT
SYR CONTROL 10ML LL (SYRINGE) ×1 IMPLANT
TOWEL OR 17X26 4PK STRL BLUE (TOWEL DISPOSABLE) IMPLANT
TUBE ANAEROBIC PORT A CUL  W/M (MISCELLANEOUS) ×1 IMPLANT

## 2012-01-03 NOTE — Transfer of Care (Signed)
Immediate Anesthesia Transfer of Care Note  Patient: Kara Gentry  Procedure(s) Performed: Procedure(s) (LRB) with comments: WOUND EXPLORATION (N/A) APPLICATION OF WOUND VAC (N/A) INSERTION OF MESH (N/A)  Patient Location: PACU  Anesthesia Type:General  Level of Consciousness: awake  Airway & Oxygen Therapy: Patient Spontanous Breathing and Patient connected to face mask oxygen  Post-op Assessment: Report given to PACU RN  Post vital signs: Reviewed and stable  Complications: No apparent anesthesia complications

## 2012-01-03 NOTE — Anesthesia Postprocedure Evaluation (Signed)
  Anesthesia Post-op Note  Patient: Kara Gentry  Procedure(s) Performed: Procedure(s) (LRB) with comments: WOUND EXPLORATION (N/A) APPLICATION OF WOUND VAC (N/A) INSERTION OF MESH (N/A)  Patient Location: PACU  Anesthesia Type:General  Level of Consciousness: awake and patient cooperative  Airway and Oxygen Therapy: Patient Spontanous Breathing and Patient connected to face mask oxygen  Post-op Pain: mild  Post-op Assessment: Post-op Vital signs reviewed, Patient's Cardiovascular Status Stable, Respiratory Function Stable, Patent Airway and No signs of Nausea or vomiting  Post-op Vital Signs: Reviewed and stable  Complications: No apparent anesthesia complications

## 2012-01-03 NOTE — Op Note (Signed)
The operative details included in brief operative note.

## 2012-01-03 NOTE — Progress Notes (Signed)
Patient resting in no apparent distress. -patient is in good spirits at this time Patient's PCA checked and found to an ample amount of medication remaining in the syringe. -no need to be changed thought the work list indicates a change in syringe is necessary Patient's family remains at the bedside.

## 2012-01-03 NOTE — Progress Notes (Signed)
Applied 2 liters of 02 via nasal cannula due to pt being tachypneic.

## 2012-01-03 NOTE — Progress Notes (Signed)
Subjective: Patient reports incisional pain and no problems voiding.  Patient pain is minimimally better than at admit   Objective: I have reviewed patient's vital signs, intake and output, medications, labs and radiology results. Significant results:  CT shows a large complicated fluid collection in front of uterus, communicating with the cul de sac, consistent with a pelvic abscess. The character of the fluid, mostly bloody suggests this began as a hematoma.  Subcutaneous tissues are edematous with stranding, but no huge pocket of subq fluid.  This likely represents drainage from the intraabdominal abscess. Hgb stable at 8.3 after 2 units and hydration, will give a additional unit preop and  Have 2 units available. Potassium improved at 3.3, will give an additional 2 runs at of 10 MEq preop. General: alert, cooperative and mild distress Resp: unlabored breathing, SaO2 100% on 2 liters  GI: abnormal findings:  moderate tenderness in the lower abdomen and around incision,  and incision: erythematous, indurated, bloody and purulent drainage present and dressing in place   Assessment/Plan: Postoperative hematoma and abscess, now draining, Anemia, corrected Hypokalemia, improved Plan: To OR today,            Give 3rd unit prbc this morning           Give 2 additonal units K 10 meq runs           Continue Zosyn   LOS: 1 day    Kara Gentry V 01/03/2012, 8:14 AM

## 2012-01-03 NOTE — Op Note (Signed)
Patient:  Kara Gentry  DOB:  07/18/1975  MRN:  [id number]   Preop Diagnosis:  Necrotizing fascitis, uterine abscess  Postop Diagnosis:  The same  Procedure:  Intra-operative consultation.  Fascial and abdominal wall closure.  Surgeon:   Dr. Tilford Pillar   Dr. Christin Bach   Anes:  GETA  Indications:  Patient recently underwent emergent c-section at 23 weeks due to complications of pregnancy.  She developed drainage from her pelvic incision and was taken to the OR by Dr. Emelda Fear.  Patient had noted fascial involvement and a large intra-uterine abscess.  Extensive debridement was undertaken by Dr. Emelda Fear.  Due to the large fascial defect, I was consulted during her operation to assist with closure options.  Procedure note:  Please see primary surgeon's operative report for all pertinent aspects of the procedure prior to closure.  Upon discussion with Dr. Emelda Fear in the operating room, it was obvious that there was a large defect in the abdominal wall due to extensive debridement for fascitis.  The serosa of the sigmoid colon was identified.  While the serosa appeared inflamed, there was no evidence of serosal defect.  A solitary loop of small bowel was also evident, but it did not appear inflamed or injured.  The remainder of the exposed rectus muscle appeared viable.  No necrotic fascia was evident at this point.  The defect was roughly 12 x 4 cm.   As plans are to extubate the patient and not to leave her medically paralyzed, closure of the abdomen is necessary. Options were discussed with Dr. Emelda Fear but we agreed to proceed with closure using a biologic mdf mesh.  Prior to closure, a 10 flat JP drain was placed into the pelvis and tunneled through the uterine wall and abdominal wall.  It was secured to the skin. The MDF was brought to the field.   This was trimmed to appropriate size.  A running 2-0 prolene x 2 was used to circumferentially close the defect along the fascial edge.  I  was pleased with the appearance of the closure.  The sub-q was left open.  A medium black VAC sponge was brought to the field. The sponge was trimmed and placed into the pelvic wound and two lateral sub-q wounds.  The protective plastic was placed.  A bridge of sponge was then created to connect the 3 sites.  Again, the protective plastic barrier was placed over the sponge.  The VAC canister was attached in standard fashion.  -125 mm hg of continuous suction was applied.  Good seal was obtained.  Patient is slated to return to the OR in 48 hours unless there is a noted change for re-evaluation.

## 2012-01-03 NOTE — Anesthesia Procedure Notes (Signed)
Procedure Name: Intubation Date/Time: 01/03/2012 11:45 AM Performed by: Glendora Score A Pre-anesthesia Checklist: Patient identified, Emergency Drugs available, Suction available and Patient being monitored Patient Re-evaluated:Patient Re-evaluated prior to inductionOxygen Delivery Method: Circle system utilized Preoxygenation: Pre-oxygenation with 100% oxygen Intubation Type: IV induction, Rapid sequence and Cricoid Pressure applied Laryngoscope Size: Miller and 2 Grade View: Grade I Tube type: Oral Tube size: 7.0 mm Number of attempts: 1 Airway Equipment and Method: Stylet Placement Confirmation: ETT inserted through vocal cords under direct vision,  positive ETCO2 and breath sounds checked- equal and bilateral Secured at: 20 cm Tube secured with: Tape Dental Injury: Teeth and Oropharynx as per pre-operative assessment

## 2012-01-03 NOTE — Anesthesia Preprocedure Evaluation (Addendum)
Anesthesia Evaluation  Patient identified by MRN, date of birth, ID band Patient awake    Reviewed: Allergy & Precautions, H&P , NPO status , Patient's Chart, lab work & pertinent test results  History of Anesthesia Complications (+) PONV  Airway Mallampati: III TM Distance: >3 FB Neck ROM: Full    Dental No notable dental hx. (+) Teeth Intact   Pulmonary neg pulmonary ROS,  breath sounds clear to auscultation  Pulmonary exam normal       Cardiovascular negative cardio ROS  Rhythm:Regular Rate:Normal     Neuro/Psych negative neurological ROS  negative psych ROS   GI/Hepatic Neg liver ROS, GERD-  Medicated and Controlled,  Endo/Other  negative endocrine ROS  Renal/GU negative Renal ROS  negative genitourinary   Musculoskeletal negative musculoskeletal ROS (+)   Abdominal   Peds  Hematology negative hematology ROS (+)   Anesthesia Other Findings   Reproductive/Obstetrics Preterm Labor Premature Rupture of Membranes Twin Gestation 23 weeks Transverse Lie                           Anesthesia Physical Anesthesia Plan  ASA: III and emergent  Anesthesia Plan: General   Post-op Pain Management:    Induction: Intravenous, Rapid sequence and Cricoid pressure planned  Airway Management Planned: Oral ETT  Additional Equipment:   Intra-op Plan:   Post-operative Plan:   Informed Consent:   Plan Discussed with:   Anesthesia Plan Comments:        Anesthesia Quick Evaluation

## 2012-01-03 NOTE — Brief Op Note (Signed)
01/02/2012 - 01/03/2012  2:15 PM  PATIENT:  Kara Gentry  36 y.o. female  PRE-OPERATIVE DIAGNOSIS:  wound abscess s/p cesarean  POST-OPERATIVE DIAGNOSIS:  wound abscess s/p cesarean, endometritis with abscess extending into incision, necrotizing fasciitis  PROCEDURE:  Procedure(s) (LRB) with comments: WOUND EXPLORATION (N/A) APPLICATION OF WOUND VAC (N/A) INSERTION OF MESH (N/A)  SURGEON:  Surgeon(s) and Role:    * Tilda Burrow, MD - Primary    * Fabio Bering, MD - Assisting  PHYSICIAN ASSISTANT:   ASSISTANTS: none   ANESTHESIA:   general  EBL:  Total I/O In: 3000 [I.V.:2300; Blood:700] Out: 1150 [Urine:1050; Blood:100]  BLOOD ADMINISTERED:2 CC PRBC  DRAINS: (Wound VAC,) Jackson-Pratt drain(s) with closed bulb suction in the Uterus and abscess cavity and Urinary Catheter (Foley) wound VAC applied to the incision  LOCAL MEDICATIONS USED:  NONE  SPECIMEN:  Source of Specimen:  Fascia and endometrial abscess lining, and anaerobic culture  DISPOSITION OF SPECIMEN:  PATHOLOGY  COUNTS:  YES  TOURNIQUET:  * No tourniquets in log *  DICTATION: .Dragon Dictation Patient was taken operating room, abdomen prepped and draped and previous wet-to-dry dressings removed. The patient was on Zosyn preoperatively and this was continued. Timeout was conducted by surgical team. The previous cesarean section incision was opened generous amounts of bloody deep red fluid was collected. Anaerobic odors existed. Anaerobic culture was obtained from fluid. The wound was cautiously explored. The subcutaneous fatty tissues were edematous particularly on the right side. The fascia was partially disrupted opened already and was opened along the Pfannenstiel incision along the entirety of the previous incision there was a defect in the upper fascial layer right side that was approximately 2 x 2 centimeters attended into the subcutaneous space these contained necrotic tissue inferior to the defect  insistent with necrotizing fasciitis. Additionally the fascia was breaking down on the left side, approximately 4 cm to 6 cm wide strip of necrotic tissue..  The midline rectus muscles were already open and can carefully be explored identifying the abscess cavity which extended to the inferior aspects of the rectus muscles into what appeared to be the uterine incision which had been a previous vertical incision on the uterus. Abscess cavity was explored and the fibrinous surface debrided thoroughly. The anterior peritoneum could be identified and the upper abdomen explored. The anterior abdominal cavity was free of significant adhesions. The small bowel and sigmoid colon were densely adherent to the left upper portion of the uterus. The cul-de-sac could not be identified due to the adhesions, however on the left side a small fluid pocket was identified and appeared clear. Needle aspiration was performed revealing clear serous fluid and the loculation then opened. The abdomen itself was copiously irrigated with approximately 5 cc of healing solution which was then evacuated. Small bowel did not appear to be involved with the infection but was adherent to the top of the uterus and was sufficiently adherent and not be able to be freed easily. The sigmoid colon on the left cornu of the uterus was densely adherent. There was a thin serosal tear that did not require repair. The serosa was so edematous as to not allow sutures to be placed. The serosal defect was carefully inspected and confirmed as showing no evidence of disruption of the bowel. The fascia was then dissected carefully removing the necrotic tissue across the upper aspect of the incision. The inferior fascia was in excellent shape. Uterine segment was intact and adherent to the posterior  aspects of the rectus muscles. Intraoperative consult with Dr. Tilford Pillar was performed to address closure technique. Anatomy was reviewed after he scrubbed in. A  flat JP drain was placed inside the uterine cavity, exiting through the anterior lower uterine segment, between the rectus muscles and exiting the fascia in the right lower quadrant this was sutured in place. The lateral aspects of the fascial area were inspected both making K. transverse incision in the right lower quadrant over the most edematous tissue. The fascia could be identified through the right lower quadrant incision just lateral to McBurney's point. The fascia appeared intact and healthy. Musculoskeletal transplant Foundation (MTF) was opened, trimmed to replace the fascial defect, and sewn with 2-0 Prolene in a continuous running fashion to the upper aspects of the fascia defect, trimmed and then sewed in a continuous running fashion to the inferior aspect of the defect giving a smooth fascial replacement the JP drain was beneath this MTF. A wound VAC was then trimmed and placed in the wound opening in the midline, covering the entire length of the incision primary, and the 2 smaller incisions in the right lower quadrant were filled with the wound VAC material, and all 3 sites attached to the wound VAC with good suction achieved. The JP drain was placed to bulb suction . Sponge and needle counts were correct. Patient will be allowed to awaken and go recovery room and back to ICU for postop care.   PLAN OF CARE: Admit to inpatient   PATIENT DISPOSITION:  PACU - hemodynamically stable.   Delay start of Pharmacological VTE agent (>24hrs) due to surgical blood loss or risk of bleeding: not applicable

## 2012-01-04 LAB — COMPREHENSIVE METABOLIC PANEL
ALT: 17 U/L (ref 0–35)
AST: 13 U/L (ref 0–37)
Albumin: 1.3 g/dL — ABNORMAL LOW (ref 3.5–5.2)
Alkaline Phosphatase: 96 U/L (ref 39–117)
BUN: 14 mg/dL (ref 6–23)
CO2: 21 mEq/L (ref 19–32)
Calcium: 7.6 mg/dL — ABNORMAL LOW (ref 8.4–10.5)
Chloride: 108 mEq/L (ref 96–112)
Creatinine, Ser: 0.45 mg/dL — ABNORMAL LOW (ref 0.50–1.10)
GFR calc Af Amer: >90 mL/min (ref >90)
GFR calc non Af Amer: >90 mL/min (ref >90)
Glucose, Bld: 103 mg/dL — ABNORMAL HIGH (ref 70–99)
Potassium: 3.6 mEq/L (ref 3.5–5.1)
Sodium: 140 mEq/L (ref 135–145)
Total Bilirubin: 0.9 mg/dL (ref 0.3–1.2)
Total Protein: 5.4 g/dL — ABNORMAL LOW (ref 6.0–8.3)

## 2012-01-04 LAB — URINALYSIS, ROUTINE W REFLEX MICROSCOPIC
Glucose, UA: NEGATIVE mg/dL
Ketones, ur: 40 mg/dL — AB
Leukocytes, UA: NEGATIVE
Nitrite: NEGATIVE
Protein, ur: 100 mg/dL — AB
Specific Gravity, Urine: >1.03 — ABNORMAL HIGH (ref 1.005–1.030)
Urobilinogen, UA: 0.2 mg/dL (ref 0.0–1.0)
pH: 6 (ref 5.0–8.0)

## 2012-01-04 LAB — CBC
HCT: 31.3 % — ABNORMAL LOW (ref 36.0–46.0)
Hemoglobin: 10.8 g/dL — ABNORMAL LOW (ref 12.0–15.0)
MCH: 28.1 pg (ref 26.0–34.0)
MCHC: 34.5 g/dL (ref 30.0–36.0)
MCV: 81.5 fL (ref 78.0–100.0)
Platelets: 485 10*3/uL — ABNORMAL HIGH (ref 150–400)
RBC: 3.84 MIL/uL — ABNORMAL LOW (ref 3.87–5.11)
RDW: 14.6 % (ref 11.5–15.5)
WBC: 23.5 10*3/uL — ABNORMAL HIGH (ref 4.0–10.5)

## 2012-01-04 LAB — URINE MICROSCOPIC-ADD ON

## 2012-01-04 MED ORDER — MENTHOL 3 MG MT LOZG
1.0000 | LOZENGE | OROMUCOSAL | Status: DC | PRN
Start: 2012-01-04 — End: 2012-01-14
  Filled 2012-01-04: qty 9

## 2012-01-04 MED ORDER — SODIUM CHLORIDE 0.9 % IV SOLN: Freq: Once | INTRAVENOUS | Status: DC

## 2012-01-04 NOTE — Progress Notes (Signed)
1 Day Post-Op  Subjective: Patient without complaints of fever or chills. No nausea. Pain is controlled. No increase in abdominal pain or discomfort.  Objective: Vital signs in last 24 hours: Temp:  [97.8 F (36.6 C)-100.2 F (37.9 C)] 97.8 F (36.6 C) (11/15 0428) Pulse Rate:  [93-126] 93  (11/15 0200) Resp:  [17-39] 20  (11/15 0200) BP: (87-115)/(55-72) 87/57 mmHg (11/15 0200) SpO2:  [95 %-100 %] 98 % (11/15 0200) Weight:  [70.8 kg (156 lb 1.4 oz)] 70.8 kg (156 lb 1.4 oz) (11/15 0428) Last BM Date: 01/01/12  Intake/Output from previous day: 11/14 0701 - 11/15 0700 In: 3700 [I.V.:2300; Blood:700] Out: 1350 [Urine:1250; Blood:100] Intake/Output this shift: Total I/O In: -  Out: 130 [Urine:60; Drains:70]  General appearance: alert and no distress GI: Quiet, soft, moderate expected tenderness. VAC is in place. Negative pressure is being maintained. JP serous sanguinous.  Lab Results:   Basename 01/04/12 0517 01/03/12 1200  WBC 23.5* 18.1*  HGB 10.8* 11.6*  HCT 31.3* 32.4*  PLT 485* 452*   BMET  Basename 01/04/12 0517 01/03/12 1200  NA 140 137  K 3.6 3.8  CL 108 104  CO2 21 24  GLUCOSE 103* 96  BUN 14 6  CREATININE 0.45* 0.50  CALCIUM 7.6* 7.2*   PT/INR No results found for this basename: LABPROT:2,INR:2 in the last 72 hours ABG No results found for this basename: PHART:2,PCO2:2,PO2:2,HCO3:2 in the last 72 hours  Studies/Results: Ct Abdomen Pelvis W Contrast  01/03/2012  *RADIOLOGY REPORT*  Clinical Data: Wound infection.  Post C-section.  CT ABDOMEN AND PELVIS WITH CONTRAST  Technique:  Multidetector CT imaging of the abdomen and pelvis was performed following the standard protocol during bolus administration of intravenous contrast.  Contrast: OMNIPAQUE IOHEXOL 300 MG/ML  SOLN  Comparison: none  Findings: Bibasilar atelectasis in the lungs.  Heart is upper limits normal size.  No effusions.  Liver, spleen, stomach, pancreas, adrenals are unremarkable.   There is mild fullness of the renal collecting systems bilaterally as well as the right ureter.  There is a fluid collection surrounding the uterus anteriorly and extending into the paracolic regions bilaterally, right greater than left.  This measures 14 cm in cross-sectional diameter.  This likely communicates with a fluid collection in the cul-de-sac.  This is concerning for postoperative abscess.  Stranding is noted in the anterior subcutaneous soft tissues adjacent to the anterior incision concerning for cellulitis.  Large and small bowel are decompressed and grossly unremarkable. Layering gallstones within the gallbladder.  IMPRESSION: Elongated fluid collection surrounding the uterus and extending into the pericolic gutters bilaterally, right greater than left concerning for abscess.  Stranding in the subcutaneous soft tissues suggest cellulitis.  Cholelithiasis.  Fullness of the renal collecting systems bilaterally, right slightly greater than left.  This may be related to sequelae from recent pregnancy.   Original Report Authenticated By: Charlett Nose, M.D.     Anti-infectives: Anti-infectives     Start     Dose/Rate Route Frequency Ordered Stop   01/03/12 1800   piperacillin-tazobactam (ZOSYN) IVPB 3.375 g  Status:  Discontinued        3.375 g 100 mL/hr over 30 Minutes Intravenous 4 times per day 01/03/12 1550 01/03/12 1557   01/03/12 1800   piperacillin-tazobactam (ZOSYN) IVPB 3.375 g        3.375 g 12.5 mL/hr over 240 Minutes Intravenous Every 8 hours 01/03/12 1556     01/03/12 1600   clindamycin (CLEOCIN) IVPB 900 mg  900 mg 100 mL/hr over 30 Minutes Intravenous 3 times per day 01/03/12 1550     01/02/12 2230   piperacillin-tazobactam (ZOSYN) IVPB 3.375 g  Status:  Discontinued        3.375 g 12.5 mL/hr over 240 Minutes Intravenous Every 8 hours 01/02/12 2228 01/03/12 1550   01/02/12 2215   piperacillin-tazobactam (ZOSYN) IVPB 3.375 g  Status:  Discontinued        3.375  g 100 mL/hr over 30 Minutes Intravenous 3 times per day 01/02/12 2218 01/02/12 2226   01/02/12 2015   piperacillin-tazobactam (ZOSYN) IVPB 3.375 g        3.375 g 12.5 mL/hr over 240 Minutes Intravenous  Once 01/02/12 2004 01/02/12 2113   01/02/12 2015   vancomycin (VANCOCIN) IVPB 1000 mg/200 mL premix  Status:  Discontinued        1,000 mg 200 mL/hr over 60 Minutes Intravenous  Once 01/02/12 2004 01/02/12 2035          Assessment/Plan: s/p Procedure(s) (LRB) with comments: WOUND EXPLORATION (N/A) APPLICATION OF WOUND VAC (N/A) INSERTION OF MESH (N/A) Continued VAC. Continue antibiotics. Plan second-look operation tomorrow per Dr. Emelda Fear .  Patient aware.  LOS: 2 days    Lupe Handley C 01/04/2012

## 2012-01-04 NOTE — Progress Notes (Signed)
Notified Dr. Emelda Fear to confirm procedure type for tomorrow.  He stated that he would be doing a Re-exploration of a wound abscess, with debridement. I notified Kim Faint and we realized we still did not have a time.  Selena Batten stated she would call him and confirm time.  I verbalized understanding.

## 2012-01-04 NOTE — Care Management Note (Signed)
    Page 1 of 2   01/14/2012     2:49:15 PM   CARE MANAGEMENT NOTE 01/14/2012  Patient:  Kara Gentry, Kara Gentry   Account Number:  [id number]  Date Initiated:  01/04/2012  Documentation initiated by:  Sharrie Rothman  Subjective/Objective Assessment:   Pt admitted from home with wound abcess. Pt lives with her husband and will return home at discharge. Pt delivered premature twins on 11/2 and only 1 baby is surviving. Pt was independent prior to hospitalization.     Action/Plan:   Will continue to follow for [facility] needs. Pt may need wound vac arranged for discharge.   Anticipated DC Date:  01/08/2012   Anticipated DC Plan:  HOME W HOME HEALTH SERVICES      DC Planning Services  CM consult      [facility] Choice  HOME HEALTH  DURABLE MEDICAL EQUIPMENT   Choice offered to / List presented to:  C-1 Patient   DME arranged  [facility]      DME agency  [facility]     [facility] arranged  HH-1 RN  IV Antibiotics      HH agency  [facility]   Status of service:  Completed, signed off Medicare Important Message given?   (If response is "NO", the following Medicare IM given date fields will be blank) Date Medicare IM given:   Date Additional Medicare IM given:    Discharge Disposition:  HOME W HOME HEALTH SERVICES  Per UR Regulation:    If discussed at Long Length of Stay Meetings, dates discussed:   01/08/2012  01/10/2012    Comments:  01/14/12 1446 Arlyss Queen, RN BSN CM Pt discharged home today with [facility] RN for IV AB and wound vac. Alroy Bailiff of [facility] is aware and will collect the pts information from the chart. HH services will start 01/14/12 to hook wound vac up at home once pt is discharged and 01/15/12  to start education for IV AB. Pt and pts nurse is aware of discharge arrangements. No DME needs noted.   01/08/12 1530 Arlyss Queen, RN BSN CM CM spoke with pt in regards to wound vac at discharge. Pt feels confident that she can manage the wound vac and  possible need for IV AB at discharge at home. Pt chose [facility] for RN, wound vac, and DME. Paperwork for wound vac left in ICU for Dr Emelda Fear to complete so that [facility] can obtain the wound vac. Will continue to follow for [facility] and DME needs.  01/04/12 1440 Arlyss Queen, RN BSN CM

## 2012-01-04 NOTE — Progress Notes (Signed)
1 Day Post-Op Procedure(s) (LRB): WOUND EXPLORATION (N/A) APPLICATION OF WOUND VAC (N/A) INSERTION OF MESH (N/A)  Subjective: Patient reports incisional pain.that is less than pre-op. Pt feels generally better.  Using PCA, adequate pain control.  Still NPO.  No vomiting or nausea.  Objective: I have reviewed patient's vital signs, intake and output and labs. CBC    Component Value Date/Time   WBC 23.5* 01/04/2012 0517   RBC 3.84* 01/04/2012 0517   HGB 10.8* 01/04/2012 0517   HCT 31.3* 01/04/2012 0517   PLT 485* 01/04/2012 0517   MCV 81.5 01/04/2012 0517   MCH 28.1 01/04/2012 0517   MCHC 34.5 01/04/2012 0517   RDW 14.6 01/04/2012 0517   LYMPHSABS 0.9 01/03/2012 1200   MONOABS 0.2 01/03/2012 1200   EOSABS 0.0 01/03/2012 1200   BASOSABS 0.0 01/03/2012 1200    Temp:  [97.8 F (36.6 C)-100.2 F (37.9 C)] 97.8 F (36.6 C) (11/15 0428) Pulse Rate:  [93-126] 93  (11/15 0200) Resp:  [17-39] 20  (11/15 0200) BP: (87-115)/(55-77) 87/57 mmHg (11/15 0200) SpO2:  [95 %-100 %] 98 % (11/15 0200) Weight:  [156 lb 1.4 oz (70.8 kg)] 156 lb 1.4 oz (70.8 kg) (11/15 0428) General: alert, cooperative and no distress Resp: clear to auscultation bilaterally GI: normal findings: bowel sounds present normal pitch, hypoactive,no typmany, abnormal findings:  hyperactive bowel sounds and incision: bloody and serous drainage present and being drawn by JP and Wound Vac. approx 100 cc in JP Extremities: Homans sign is negative, no sign of DVT Unine output has become bloody, with proteinaceous debris, will check u/a Assessment: s/p Procedure(s) (LRB) with comments: WOUND EXPLORATION (N/A) APPLICATION OF WOUND VAC (N/A) INSERTION OF MESH (N/A): stable  Plan: NPO keep foley in for fluid control. Add ice chips and hard candy , suckers Plan to return to OR in a.m. For reinspection of debridement.  LOS: 2 days    Safiya Girdler V 01/04/2012, 8:33 AM

## 2012-01-05 ENCOUNTER — Encounter (HOSPITAL_COMMUNITY): Payer: Self-pay | Admitting: Anesthesiology

## 2012-01-05 ENCOUNTER — Encounter (HOSPITAL_COMMUNITY): Payer: Self-pay | Admitting: *Deleted

## 2012-01-05 ENCOUNTER — Encounter (HOSPITAL_COMMUNITY)
Admission: EM | Disposition: A | Discharge status: Home / Self Care | Payer: Self-pay | Source: Home / Self Care | Attending: Obstetrics and Gynecology

## 2012-01-05 ENCOUNTER — Inpatient Hospital Stay (HOSPITAL_COMMUNITY): Payer: BC Managed Care – PPO | Admitting: Anesthesiology

## 2012-01-05 HISTORY — PX: APPLICATION OF WOUND VAC: SHX5189

## 2012-01-05 HISTORY — PX: WOUND EXPLORATION: SHX6188

## 2012-01-05 LAB — CBC WITH DIFFERENTIAL/PLATELET
Basophils Absolute: 0 10*3/uL (ref 0.0–0.1)
Basophils Relative: 0 % (ref 0–1)
Eosinophils Absolute: 0 10*3/uL (ref 0.0–0.7)
Eosinophils Relative: 0 % (ref 0–5)
HCT: 28 % — ABNORMAL LOW (ref 36.0–46.0)
Hemoglobin: 9.8 g/dL — ABNORMAL LOW (ref 12.0–15.0)
Lymphocytes Relative: 10 % — ABNORMAL LOW (ref 12–46)
Lymphs Abs: 1.6 10*3/uL (ref 0.7–4.0)
MCH: 28.6 pg (ref 26.0–34.0)
MCHC: 35 g/dL (ref 30.0–36.0)
MCV: 81.6 fL (ref 78.0–100.0)
Monocytes Absolute: 0.3 10*3/uL (ref 0.1–1.0)
Monocytes Relative: 2 % — ABNORMAL LOW (ref 3–12)
Neutro Abs: 14.1 10*3/uL — ABNORMAL HIGH (ref 1.7–7.7)
Neutrophils Relative %: 88 % — ABNORMAL HIGH (ref 43–77)
Platelets: 497 10*3/uL — ABNORMAL HIGH (ref 150–400)
RBC: 3.43 MIL/uL — ABNORMAL LOW (ref 3.87–5.11)
RDW: 14.8 % (ref 11.5–15.5)
WBC: 16 10*3/uL — ABNORMAL HIGH (ref 4.0–10.5)

## 2012-01-05 LAB — BASIC METABOLIC PANEL
BUN: 20 mg/dL (ref 6–23)
CO2: 22 mEq/L (ref 19–32)
Calcium: 7.7 mg/dL — ABNORMAL LOW (ref 8.4–10.5)
Chloride: 109 mEq/L (ref 96–112)
Creatinine, Ser: 0.51 mg/dL (ref 0.50–1.10)
GFR calc Af Amer: >90 mL/min (ref >90)
GFR calc non Af Amer: >90 mL/min (ref >90)
Glucose, Bld: 107 mg/dL — ABNORMAL HIGH (ref 70–99)
Potassium: 3.1 mEq/L — ABNORMAL LOW (ref 3.5–5.1)
Sodium: 140 mEq/L (ref 135–145)

## 2012-01-05 SURGERY — WOUND EXPLORATION
Anesthesia: General | Site: Abdomen | Wound class: Dirty or Infected

## 2012-01-05 MED ORDER — FENTANYL CITRATE 0.05 MG/ML IJ SOLN
INTRAMUSCULAR | Status: AC
  Filled 2012-01-05: qty 5

## 2012-01-05 MED ORDER — FENTANYL CITRATE 0.05 MG/ML IJ SOLN
INTRAMUSCULAR | Status: AC
  Filled 2012-01-05: qty 2

## 2012-01-05 MED ORDER — NEOSTIGMINE METHYLSULFATE 1 MG/ML IJ SOLN
INTRAMUSCULAR | Status: DC | PRN
  Administered 2012-01-05: 4 mg via INTRAVENOUS

## 2012-01-05 MED ORDER — ACETAMINOPHEN 10 MG/ML IV SOLN
1000.0000 mg | Freq: Once | INTRAVENOUS | Status: AC
  Administered 2012-01-05: 1000 mg via INTRAVENOUS
  Filled 2012-01-05: qty 100

## 2012-01-05 MED ORDER — MIDAZOLAM HCL 5 MG/5ML IJ SOLN
INTRAMUSCULAR | Status: DC | PRN
  Administered 2012-01-05: 2 mg via INTRAVENOUS

## 2012-01-05 MED ORDER — NEOSTIGMINE METHYLSULFATE 1 MG/ML IJ SOLN
INTRAMUSCULAR | Status: AC
  Filled 2012-01-05: qty 10

## 2012-01-05 MED ORDER — LIDOCAINE HCL (PF) 1 % IJ SOLN
INTRAMUSCULAR | Status: AC
  Filled 2012-01-05: qty 5

## 2012-01-05 MED ORDER — DEXTROSE IN LACTATED RINGERS 5 % IV SOLN
INTRAVENOUS | Status: DC
  Administered 2012-01-05 (×3): via INTRAVENOUS
  Administered 2012-01-06: 75 mL/h via INTRAVENOUS
  Administered 2012-01-08 – 2012-01-09 (×2): via INTRAVENOUS

## 2012-01-05 MED ORDER — 0.9 % SODIUM CHLORIDE (POUR BTL) OPTIME
TOPICAL | Status: DC | PRN
  Administered 2012-01-05: 1000 mL

## 2012-01-05 MED ORDER — GLYCOPYRROLATE 0.2 MG/ML IJ SOLN
INTRAMUSCULAR | Status: DC | PRN
  Administered 2012-01-05: 0.6 mg via INTRAVENOUS

## 2012-01-05 MED ORDER — FENTANYL CITRATE 0.05 MG/ML IJ SOLN
INTRAMUSCULAR | Status: DC | PRN
  Administered 2012-01-05: 100 ug via INTRAVENOUS
  Administered 2012-01-05: 50 ug via INTRAVENOUS
  Administered 2012-01-05: 100 ug via INTRAVENOUS
  Administered 2012-01-05 (×2): 50 ug via INTRAVENOUS

## 2012-01-05 MED ORDER — SUCCINYLCHOLINE CHLORIDE 20 MG/ML IJ SOLN
INTRAMUSCULAR | Status: AC
  Filled 2012-01-05: qty 1

## 2012-01-05 MED ORDER — ROCURONIUM BROMIDE 100 MG/10ML IV SOLN
INTRAVENOUS | Status: DC | PRN
  Administered 2012-01-05: 20 mg via INTRAVENOUS
  Administered 2012-01-05: 10 mg via INTRAVENOUS

## 2012-01-05 MED ORDER — GLYCOPYRROLATE 0.2 MG/ML IJ SOLN
INTRAMUSCULAR | Status: AC
  Filled 2012-01-05: qty 3

## 2012-01-05 MED ORDER — ARTIFICIAL TEARS OP OINT
TOPICAL_OINTMENT | OPHTHALMIC | Status: AC
  Filled 2012-01-05: qty 3.5

## 2012-01-05 MED ORDER — SODIUM CHLORIDE 0.9 % IV SOLN
INTRAVENOUS | Status: DC | PRN
  Administered 2012-01-05: 12:00:00 via INTRAVENOUS

## 2012-01-05 MED ORDER — ACETAMINOPHEN 10 MG/ML IV SOLN
INTRAVENOUS | Status: AC
  Filled 2012-01-05: qty 100

## 2012-01-05 MED ORDER — PROPOFOL 10 MG/ML IV EMUL
INTRAVENOUS | Status: DC | PRN
  Administered 2012-01-05: 130 mg via INTRAVENOUS

## 2012-01-05 MED ORDER — SUCCINYLCHOLINE CHLORIDE 20 MG/ML IJ SOLN
INTRAMUSCULAR | Status: DC | PRN
  Administered 2012-01-05: 120 mg via INTRAVENOUS

## 2012-01-05 MED ORDER — LIDOCAINE HCL (CARDIAC) 10 MG/ML IV SOLN
INTRAVENOUS | Status: DC | PRN
  Administered 2012-01-05: 50 mg via INTRAVENOUS

## 2012-01-05 MED ORDER — POTASSIUM CHLORIDE 10 MEQ/100ML IV SOLN
10.0000 meq | INTRAVENOUS | Status: DC
  Administered 2012-01-05 (×4): 10 meq via INTRAVENOUS
  Filled 2012-01-05: qty 300
  Filled 2012-01-05: qty 100

## 2012-01-05 MED ORDER — LACTATED RINGERS IV SOLN
INTRAVENOUS | Status: DC | PRN
  Administered 2012-01-05: 11:00:00 via INTRAVENOUS

## 2012-01-05 MED ORDER — PROPOFOL 10 MG/ML IV EMUL
INTRAVENOUS | Status: AC
  Filled 2012-01-05: qty 20

## 2012-01-05 MED ORDER — FUROSEMIDE 10 MG/ML IJ SOLN
20.0000 mg | Freq: Once | INTRAMUSCULAR | Status: AC
  Administered 2012-01-05: 20 mg via INTRAVENOUS
  Filled 2012-01-05: qty 2

## 2012-01-05 MED ORDER — MIDAZOLAM HCL 2 MG/2ML IJ SOLN
INTRAMUSCULAR | Status: AC
  Filled 2012-01-05: qty 2

## 2012-01-05 SURGICAL SUPPLY — 39 items
BAG HAMPER (MISCELLANEOUS) ×2 IMPLANT
CANISTER WOUND CARE 500ML ATS (WOUND CARE) ×1 IMPLANT
CLOTH BEACON ORANGE TIMEOUT ST (SAFETY) ×2 IMPLANT
COVER LIGHT HANDLE STERIS (MISCELLANEOUS) ×4 IMPLANT
DRSG VAC ATS MED SENSATRAC (GAUZE/BANDAGES/DRESSINGS) ×1 IMPLANT
ELECT REM PT RETURN 9FT ADLT (ELECTROSURGICAL) ×2
ELECTRODE REM PT RTRN 9FT ADLT (ELECTROSURGICAL) ×1 IMPLANT
EVACUATOR DRAINAGE 10X20 100CC (DRAIN) ×1 IMPLANT
EVACUATOR SILICONE 100CC (DRAIN) ×2
GAUZE PACKING 2X5 YD STERILE (GAUZE/BANDAGES/DRESSINGS) ×1 IMPLANT
GLOVE BIOGEL PI IND STRL 7.5 (GLOVE) IMPLANT
GLOVE BIOGEL PI INDICATOR 7.5 (GLOVE) ×1
GLOVE ECLIPSE 7.0 STRL STRAW (GLOVE) ×1 IMPLANT
GLOVE ECLIPSE 9.0 STRL (GLOVE) ×3 IMPLANT
GLOVE EXAM NITRILE MD LF STRL (GLOVE) ×1 IMPLANT
GLOVE INDICATOR 7.0 STRL GRN (GLOVE) ×2 IMPLANT
GLOVE INDICATOR STER SZ 9 (GLOVE) ×2 IMPLANT
GLOVE SS BIOGEL STRL SZ 6.5 (GLOVE) IMPLANT
GLOVE SUPERSENSE BIOGEL SZ 6.5 (GLOVE) ×1
GOWN STRL REIN 3XL LVL4 (GOWN DISPOSABLE) ×2 IMPLANT
GOWN STRL REIN XL XLG (GOWN DISPOSABLE) ×3 IMPLANT
INST SET MAJOR GENERAL (KITS) ×2 IMPLANT
KIT ROOM TURNOVER APOR (KITS) ×2 IMPLANT
MANIFOLD NEPTUNE II (INSTRUMENTS) ×2 IMPLANT
NS IRRIG 1000ML POUR BTL (IV SOLUTION) ×2 IMPLANT
PACK ABDOMINAL MAJOR (CUSTOM PROCEDURE TRAY) ×2 IMPLANT
PAD ABD 5X9 TENDERSORB (GAUZE/BANDAGES/DRESSINGS) ×1 IMPLANT
PAD ARMBOARD 7.5X6 YLW CONV (MISCELLANEOUS) ×2 IMPLANT
SET BASIN LINEN APH (SET/KITS/TRAYS/PACK) ×2 IMPLANT
SOL PREP PROV IODINE SCRUB 4OZ (MISCELLANEOUS) ×2 IMPLANT
SPONGE DRAIN TRACH 4X4 STRL 2S (GAUZE/BANDAGES/DRESSINGS) ×1 IMPLANT
SPONGE GAUZE 4X4 12PLY (GAUZE/BANDAGES/DRESSINGS) ×1 IMPLANT
SPONGE LAP 18X18 X RAY DECT (DISPOSABLE) ×1 IMPLANT
SUT ETHILON 3 0 FSL (SUTURE) ×1 IMPLANT
SUT PROLENE 0 CT 1 30 (SUTURE) ×2 IMPLANT
SUT PROLENE 2 0 FS (SUTURE) ×1 IMPLANT
SUT PROLENE 2 0 SH 30 (SUTURE) ×1 IMPLANT
TAPE CLOTH SURG 4X10 WHT LF (GAUZE/BANDAGES/DRESSINGS) ×1 IMPLANT
TOWEL OR 17X26 4PK STRL BLUE (TOWEL DISPOSABLE) ×1 IMPLANT

## 2012-01-05 NOTE — Progress Notes (Signed)
@  ND UNIT PRBC COMPLETED

## 2012-01-05 NOTE — Progress Notes (Signed)
1ST UNIT FFP STARTED. PT REMAINS STABLE.

## 2012-01-05 NOTE — Anesthesia Procedure Notes (Signed)
Procedure Name: Intubation Date/Time: 01/05/2012 11:11 AM Performed by: Carolyne Littles, AMY L Pre-anesthesia Checklist: Patient identified, Patient being monitored, Timeout performed, Emergency Drugs available and Suction available Patient Re-evaluated:Patient Re-evaluated prior to inductionOxygen Delivery Method: Circle System Utilized Preoxygenation: Pre-oxygenation with 100% oxygen Intubation Type: IV induction, Rapid sequence and Cricoid Pressure applied Ventilation: Mask ventilation without difficulty Laryngoscope Size: 3 and Miller Grade View: Grade I Tube type: Oral Tube size: 7.0 mm Number of attempts: 1 Airway Equipment and Method: stylet Placement Confirmation: ETT inserted through vocal cords under direct vision,  positive ETCO2 and breath sounds checked- equal and bilateral Secured at: 21 cm Tube secured with: Tape Dental Injury: Teeth and Oropharynx as per pre-operative assessment

## 2012-01-05 NOTE — Transfer of Care (Signed)
  Anesthesia Post-op Note  Patient: Kara Gentry  Procedure(s) Performed: Procedure(s) (LRB) with comments: WOUND EXPLORATION (N/A) - Re-Exploration of Wound Abscess with Debridement APPLICATION OF WOUND VAC (N/A)  Patient Location: PACU  Anesthesia Type: General  Level of Consciousness: awake, alert , oriented and patient cooperative  Airway and Oxygen Therapy: Patient Spontanous Breathing and Patient connected to face mask oxygen  Post-op Pain: mild  Post-op Assessment: Post-op Vital signs reviewed, Patient's Cardiovascular Status Stable, Respiratory Function Stable, Patent Airway and No signs of Nausea or vomiting  Post-op Vital Signs: Reviewed and stable  Complications: No apparent anesthesia complications

## 2012-01-05 NOTE — Op Note (Signed)
See operative details included in the brief operative note 

## 2012-01-05 NOTE — Progress Notes (Signed)
1 uPRBCs started @ 1320 per MD order.  No signs of symptoms of transfusion reaction.  Pt. Denies any c/o SOB or discomfort.  Will continue to monitor.

## 2012-01-05 NOTE — Anesthesia Preprocedure Evaluation (Addendum)
Anesthesia Evaluation  Patient identified by MRN, date of birth, ID band Patient awake    Reviewed: Allergy & Precautions, H&P , NPO status , Patient's Chart, lab work & pertinent test results  Airway Mallampati: II  Neck ROM: Full    Dental   Pulmonary  breath sounds clear to auscultation        Cardiovascular Rhythm:Regular     Neuro/Psych    GI/Hepatic GERD-  Controlled,  Endo/Other    Renal/GU      Musculoskeletal   Abdominal   Peds  Hematology   Anesthesia Other Findings   Reproductive/Obstetrics Csection [redacted]weeks gestation; transverse lie                          Anesthesia Physical Anesthesia Plan  ASA: III  Anesthesia Plan: General   Post-op Pain Management:    Induction: Intravenous, Rapid sequence and Cricoid pressure planned  Airway Management Planned: Oral ETT  Additional Equipment:   Intra-op Plan:   Post-operative Plan: Extubation in OR  Informed Consent: I have reviewed the patients History and Physical, chart, labs and discussed the procedure including the risks, benefits and alternatives for the proposed anesthesia with the patient or authorized representative who has indicated his/her understanding and acceptance.   Dental advisory given  Plan Discussed with: Anesthesiologist and Surgeon  Anesthesia Plan Comments:         Anesthesia Quick Evaluation

## 2012-01-05 NOTE — Brief Op Note (Signed)
01/02/2012 - 01/05/2012  12:33 PM  PATIENT:  Kara Gentry  36 y.o. female  PRE-OPERATIVE DIAGNOSIS:  wound abscess, necrotizing fasciitis  POST-OPERATIVE DIAGNOSIS:  wound abscess, necrotizing fasciitis  PROCEDURE:  Procedure(s) (LRB) with comments: WOUND EXPLORATION (N/A) - Re-Exploration of Wound Abscess with Debridement APPLICATION OF WOUND VAC (N/A)  SURGEON:  Surgeon(s) and Role:    * Tilda Burrow, MD -     * Fabio Bering, MD -   PHYSICIAN ASSISTANT:   ASSISTANTS: Witt, CST   ANESTHESIA:   general  EBL:  Total I/O In: 1150 [I.V.:700; Blood:350; IV Piggyback:100] Out: 525 [Urine:475; Blood:50]  BLOOD ADMINISTERED:2 units CC PRBC  DRAINS: (Wound vac in prior Pfannensteil incision, as well as in 4 separate skin incisions, 2 on rt, 2 on left lower abdomen) Jackson-Pratt drain(s) with closed bulb suction in the subfascial space, suprapubic area,, Urinary Catheter (Foley) and    LOCAL MEDICATIONS USED:  NONE  SPECIMEN:  No Specimen  DISPOSITION OF SPECIMEN:  N/A  COUNTS:  YES  TOURNIQUET:  * No tourniquets in log *  DICTATION: .Dragon Dictation Patient was taken to the operating room and the previous wound VAC and JP drain removed and the abdomen prepped and draped. She was on antibiotics from her ongoing therapy with Zosyn and Cleocin this covered perioperative antibiotics. Timeout was conducted and confirmed. The Pfannenstiel incision was inspected and and small amount of debridement necessary. He was noted on the right lower abdomen and right flank that there was a dense induration and efforts and finger dissection above the fascia into the fatty tissue beneath the induration revealed some malodorous edematous tissue extending to superior and lateral to the left anterior superior iliac crest. A 4 cm long incision was made at the lateral and superior most area of dense induration, and opened down to the tissues just above the fascia. The fascia appeared intact. The  subcutaneous tissues could be a McPherson massage when purulent liquid material was identified the identified tracts were opened manually. The area beneath the umbilicus was also similarly inspected and opened up from inferior toward the skin when some tracts of edematous purulent tissue was identified a separate transverse 3 cm incision was made just to the left of the midline and inferior to the umbilicus which allowed access to this tissue area. On the right side the 4 cm opening lateral and superior to the right anterior superior iliac crest was opened an additional 2 cm, and explored digitally breaking up some loculations and confirming good vascularity in the fascia in this area. Copious irrigation was performed. The biologic MTf was mobilized by taking down the previous running 2-0 Prolene suture in the area beneath it similarly irrigated inspected. We did not open the abdomen as it appeared to be healthy with induration in the rectus muscles. The bowel could not be visualized. Once copious irrigation had been performed all the openings were irrigated repeatedly and is to rule out additional loculation, the MtF was once again sewn in place, this time with 3 small perforations on the inferior aspects of the MTF graft. Wound VAC was placed, medium-sized in the Pfannenstiel incision as well as the 4 additional incision sites,, and connected to the wound VAC apparatus. The JP drain had been replaced beneath the MTF graft and allowed to exit suprapubically. Patient tolerated procedure well and went to recovery in stable condition she received immune Intra-Op and will receive her second postop unit packed cells.. Antibiotics will be restarted and  plans are for reinspection in 48 hours.  PLAN OF CARE: Admit to inpatient   PATIENT DISPOSITION:  PACU - hemodynamically stable.   Delay start of Pharmacological VTE agent (>24hrs) due to surgical blood loss or risk of bleeding: not applicable

## 2012-01-05 NOTE — Progress Notes (Signed)
Day of Surgery Procedure(s) (LRB): WOUND EXPLORATION (N/A) APPLICATION OF WOUND VAC (N/A)  Subjective: Patient reports incisional pain.  Requiring PCA,  No nausea, Surgery reviewed with patient  Objective: BP 101/68  Pulse 111  Temp 98 F (36.7 C) (Oral)  Resp 22  Ht 5\' 2"  (1.575 m)  Wt 170 lb 6.7 oz (77.3 kg)  BMI 31.17 kg/m2  SpO2 94%  LMP 06/17/2011  Breastfeeding? Yes I have reviewed patient's vital signs, intake and output, medications and microbiology. Urine output much improved, not pt is s/p 2 additional units prbc and 1 unit FFP, so will administer lasix due to developing 3rd spacing. General: alert, cooperative and mild distress GI: Jp and wound vac functioning Extremities: edema 3+left arm where Iv infusing well, 2+ in abdomen.2+ lower extremity ibuprofen Assessment: s/p Procedure(s) (LRB) with comments: WOUND EXPLORATION (N/A) - Re-Exploration of Wound Abscess with Debridement APPLICATION OF WOUND VAC (N/A): stable and necrotizing fasciitis s/p 2nd exploration  Plan: Clear liquids lasix 20 mg i.v. reduce IV rate to 75/hr Continue zosyn and Cleocin Next debridement Monday  LOS: 3 days    Kara Gentry V 01/05/2012, 6:48 PM

## 2012-01-05 NOTE — Progress Notes (Signed)
PT TO OR FOR SURGERY. PCA PUMP ON HOLD. FOLEY PATENT . WOUND VAC FUNCTIONING. SCD'S AND TED HOSE IN PLACE. RT JP CHARGED.IV'S X2 PATENT. PT ALERT, ORIENTED AND CALM. FAMILY AT BEDSIDE.

## 2012-01-05 NOTE — Preoperative (Signed)
Beta Blockers   Reason not to administer Beta Blockers:Not Applicable 

## 2012-01-05 NOTE — Addendum Note (Signed)
Addendum  created 01/05/12 1323 by Marolyn Hammock, CRNA   Modules edited:Charges VN

## 2012-01-05 NOTE — Anesthesia Postprocedure Evaluation (Signed)
  Anesthesia Post-op Note  Patient: Kara Gentry  Procedure(s) Performed: Procedure(s) (LRB) with comments: WOUND EXPLORATION (N/A) - Re-Exploration of Wound Abscess with Debridement APPLICATION OF WOUND VAC (N/A)  Patient Location: PACU  Anesthesia Type: General  Level of Consciousness: awake, alert , oriented and patient cooperative  Airway and Oxygen Therapy: Patient Spontanous Breathing and Patient connected to face mask oxygen  Post-op Pain: mild  Post-op Assessment: Post-op Vital signs reviewed, Patient's Cardiovascular Status Stable, Respiratory Function Stable, Patent Airway and No signs of Nausea or vomiting  Post-op Vital Signs: Reviewed and stable  Complications: No apparent anesthesia complications  

## 2012-01-05 NOTE — Progress Notes (Signed)
Day of Surgery Procedure(s) (LRB): WOUND EXPLORATION (N/A) APPLICATION OF WOUND VAC (N/A)postop day 2  Subjective: Patient reports + flatus.  Pain is quite low, no vomiting or nausea  Objective: I have reviewed patient's vital signs, medications and labs., notable for anemia to hgb 9.8,, and hypoKalemia to K 3.1. WBC improved at 16.0 Patient has been re-consented for wound reexploration to assess integrity of remaining fascia, and wound vac will be replaced.  CBC    Component Value Date/Time   WBC 16.0* 01/05/2012 0517   RBC 3.43* 01/05/2012 0517   HGB 9.8* 01/05/2012 0517   HCT 28.0* 01/05/2012 0517   PLT 497* 01/05/2012 0517   MCV 81.6 01/05/2012 0517   MCH 28.6 01/05/2012 0517   MCHC 35.0 01/05/2012 0517   RDW 14.8 01/05/2012 0517   LYMPHSABS 1.6 01/05/2012 0517   MONOABS 0.3 01/05/2012 0517   EOSABS 0.0 01/05/2012 0517   BASOSABS 0.0 01/05/2012 0517  \     General: alert, cooperative and no distress Resp: clear to auscultation bilaterally GI: abnormal findings:  hypoactive bowel sounds and incision: Wound vac and JP in place, functioning Extremities: Homans sign is negative, no sign of DVT  Assessment: s/p Procedure(s) (LRB) with comments: WOUND EXPLORATION (N/A) - Re-Exploration of Wound Abscess with Debridement/Application of Wound Vac APPLICATION OF WOUND VAC (N/A): stable, ileus present and mild anemia, hypokalemia   Plan: return to or to reinspect the integrity of remaining tissue, and re-apply wound vac.  LOS: 3 days    Kara Gentry 01/05/2012, 10:40 AM

## 2012-01-06 LAB — CBC WITH DIFFERENTIAL/PLATELET
Basophils Absolute: 0.1 10*3/uL (ref 0.0–0.1)
Basophils Relative: 0 % (ref 0–1)
Eosinophils Absolute: 0.1 10*3/uL (ref 0.0–0.7)
Eosinophils Relative: 1 % (ref 0–5)
HCT: 34.3 % — ABNORMAL LOW (ref 36.0–46.0)
Hemoglobin: 11.8 g/dL — ABNORMAL LOW (ref 12.0–15.0)
Lymphocytes Relative: 11 % — ABNORMAL LOW (ref 12–46)
Lymphs Abs: 1.4 10*3/uL (ref 0.7–4.0)
MCH: 28.9 pg (ref 26.0–34.0)
MCHC: 34.4 g/dL (ref 30.0–36.0)
MCV: 84.1 fL (ref 78.0–100.0)
Monocytes Absolute: 0.4 10*3/uL (ref 0.1–1.0)
Monocytes Relative: 3 % (ref 3–12)
Neutro Abs: 11.6 10*3/uL — ABNORMAL HIGH (ref 1.7–7.7)
Neutrophils Relative %: 86 % — ABNORMAL HIGH (ref 43–77)
Platelets: 476 10*3/uL — ABNORMAL HIGH (ref 150–400)
RBC: 4.08 MIL/uL (ref 3.87–5.11)
RDW: 15 % (ref 11.5–15.5)
WBC: 13.6 10*3/uL — ABNORMAL HIGH (ref 4.0–10.5)

## 2012-01-06 LAB — TYPE AND SCREEN
ABO/RH(D): O POS
Antibody Screen: NEGATIVE
Unit division: 0
Unit division: 0
Unit division: 0
Unit division: 0
Unit division: 0
Unit division: 0

## 2012-01-06 LAB — WOUND CULTURE

## 2012-01-06 LAB — BASIC METABOLIC PANEL
BUN: 10 mg/dL (ref 6–23)
CO2: 24 mEq/L (ref 19–32)
Calcium: 8.2 mg/dL — ABNORMAL LOW (ref 8.4–10.5)
Chloride: 109 mEq/L (ref 96–112)
Creatinine, Ser: 0.72 mg/dL (ref 0.50–1.10)
GFR calc Af Amer: >90 mL/min (ref >90)
GFR calc non Af Amer: >90 mL/min (ref >90)
Glucose, Bld: 96 mg/dL (ref 70–99)
Potassium: 3.4 mEq/L — ABNORMAL LOW (ref 3.5–5.1)
Sodium: 141 mEq/L (ref 135–145)

## 2012-01-06 LAB — PREPARE FRESH FROZEN PLASMA
Unit division: 0
Unit division: 0

## 2012-01-06 MED ORDER — ACETAMINOPHEN 325 MG PO TABS
650.0000 mg | ORAL_TABLET | ORAL | Status: DC | PRN
  Administered 2012-01-06 – 2012-01-10 (×5): 650 mg via ORAL
  Filled 2012-01-06 (×6): qty 2

## 2012-01-06 MED ORDER — BISACODYL 10 MG RE SUPP
10.0000 mg | Freq: Once | RECTAL | Status: DC
  Filled 2012-01-06: qty 1

## 2012-01-06 NOTE — Progress Notes (Signed)
Pt requesting time to try to have a bowel movement before having suppository. Pt sitting up on BSC and attempting to have a bowel movement, stated "If I can't have a bowel movement by this afternoon I will have the suppository." Pt encouraged to have suppository and educated on its importance. Will continue to monitor.

## 2012-01-06 NOTE — Progress Notes (Signed)
Pt able to have bowel movement this afternoon at 1800. This am pt wanted to wait on receiving suppository to see if she could have a bowel movement on her own. Suppository not given. Pt had 1 loose medium bowel movement.

## 2012-01-06 NOTE — Anesthesia Postprocedure Evaluation (Signed)
  Anesthesia Post-op Note  Patient: Kara Gentry  Procedure(s) Performed: Procedure(s) (LRB) with comments: WOUND EXPLORATION (N/A) - Re-Exploration of Wound Abscess with Debridement APPLICATION OF WOUND VAC (N/A)  Patient Location: PACU  Anesthesia Type: General  Level of Consciousness: awake, alert , oriented and patient cooperative  Airway and Oxygen Therapy: Patient Spontanous Breathing O2 via nasal cannula  Post-op Pain: mild  Post-op Assessment: Post-op Vital signs reviewed, Patient's Cardiovascular Status Stable, Respiratory Function Stable, Patent Airway and No signs of Nausea or vomiting  Post-op Vital Signs: Reviewed and stable  Complications: No apparent anesthesia complications

## 2012-01-06 NOTE — Progress Notes (Signed)
1 Day Post-Op  Subjective: Sleeping.  No issues per RN.  Objective: Vital signs in last 24 hours: Temp:  [97.6 F (36.4 C)-100.8 F (38.2 C)] 99.8 F (37.7 C) (11/17 1600) Pulse Rate:  [84-113] 88  (11/17 1600) Resp:  [18-33] 26  (11/17 1600) BP: (89-119)/(61-80) 109/80 mmHg (11/17 1600) SpO2:  [92 %-98 %] 97 % (11/17 1600) Weight:  [77.9 kg (171 lb 11.8 oz)] 77.9 kg (171 lb 11.8 oz) (11/17 0500) Last BM Date: 01/01/12  Intake/Output from previous day: 11/16 0701 - 11/17 0700 In: 4949.9 [I.V.:3583.3; Blood:1016.7; IV Piggyback:350] Out: 4375 [Urine:4325; Blood:50] Intake/Output this shift: Total I/O In: 1536 [P.O.:750; I.V.:686; IV Piggyback:100] Out: 990 [Urine:875; Drains:115]  VAC checked and functional.  Negative pressure.  Lab Results:   [facility] 01/06/12 0351 01/05/12 0517  WBC 13.6* 16.0*  HGB 11.8* 9.8*  HCT 34.3* 28.0*  PLT 476* 497*   BMET  Basename 01/06/12 0351 01/05/12 0517  NA 141 140  K 3.4* 3.1*  CL 109 109  CO2 24 22  GLUCOSE 96 107*  BUN 10 20  CREATININE 0.72 0.51  CALCIUM 8.2* 7.7*   PT/INR No results found for this basename: LABPROT:2,INR:2 in the last 72 hours ABG No results found for this basename: PHART:2,PCO2:2,PO2:2,HCO3:2 in the last 72 hours  Studies/Results: No results found.  Anti-infectives: Anti-infectives     Start     Dose/Rate Route Frequency Ordered Stop   01/03/12 1800   piperacillin-tazobactam (ZOSYN) IVPB 3.375 g  Status:  Discontinued        3.375 g 100 mL/hr over 30 Minutes Intravenous 4 times per day 01/03/12 1550 01/03/12 1557   01/03/12 1800   piperacillin-tazobactam (ZOSYN) IVPB 3.375 g        3.375 g 12.5 mL/hr over 240 Minutes Intravenous Every 8 hours 01/03/12 1556     01/03/12 1600   clindamycin (CLEOCIN) IVPB 900 mg        900 mg 100 mL/hr over 30 Minutes Intravenous 3 times per day 01/03/12 1550     01/02/12 2230   piperacillin-tazobactam (ZOSYN) IVPB 3.375 g  Status:  Discontinued        3.375  g 12.5 mL/hr over 240 Minutes Intravenous Every 8 hours 01/02/12 2228 01/03/12 1550   01/02/12 2215   piperacillin-tazobactam (ZOSYN) IVPB 3.375 g  Status:  Discontinued        3.375 g 100 mL/hr over 30 Minutes Intravenous 3 times per day 01/02/12 2218 01/02/12 2226   01/02/12 2015   piperacillin-tazobactam (ZOSYN) IVPB 3.375 g        3.375 g 12.5 mL/hr over 240 Minutes Intravenous  Once 01/02/12 2004 01/02/12 2113   01/02/12 2015   vancomycin (VANCOCIN) IVPB 1000 mg/200 mL premix  Status:  Discontinued        1,000 mg 200 mL/hr over 60 Minutes Intravenous  Once 01/02/12 2004 01/02/12 2035          Assessment/Plan: s/p Procedure(s) (LRB) with comments: WOUND EXPLORATION (N/A) - Re-Exploration of Wound Abscess with Debridement APPLICATION OF WOUND VAC (N/A) Plan third look tomorrow.  LOS: 4 days    Gaelyn Tukes C 01/06/2012

## 2012-01-06 NOTE — Addendum Note (Signed)
Addendum  created 01/06/12 1851 by Marolyn Hammock, CRNA   Modules edited:Notes Section

## 2012-01-06 NOTE — Progress Notes (Signed)
Negative Pressure [employer] infoVAC for wound vac malfunctioning stating "internal temperature exceeded." [employer] called and a replacement negative pressure [employer] infoVAC was brought and switched out. Will continue to monitor.

## 2012-01-06 NOTE — Progress Notes (Signed)
1 Day Post-Op Procedure(s) (LRB): WOUND EXPLORATION (N/A), second exploration APPLICATION OF WOUND VAC (N/A)  Subjective: Patient reports that she still needs PCA, no flatus, no bm, feels stable.    Objective: I have reviewed patient's vital signs, intake and output, medications, labs and microbiology. WBC improving. Hgb improved s/p 2 more units prbc and 1 unit ffp.  Urine output 2400 cc overnite after lasix 20mg .  CBC    Component Value Date/Time   WBC 13.6* 01/06/2012 0351   RBC 4.08 01/06/2012 0351   HGB 11.8* 01/06/2012 0351   HCT 34.3* 01/06/2012 0351   PLT 476* 01/06/2012 0351   MCV 84.1 01/06/2012 0351   MCH 28.9 01/06/2012 0351   MCHC 34.4 01/06/2012 0351   RDW 15.0 01/06/2012 0351   LYMPHSABS 1.4 01/06/2012 0351   MONOABS 0.4 01/06/2012 0351   EOSABS 0.1 01/06/2012 0351   BASOSABS 0.1 01/06/2012 0351   BP 111/76  Pulse 93  Temp 100.4 F (38 C) (Axillary)  Resp 20  Ht 5\' 2"  (1.575 m)  Wt 171 lb 11.8 oz (77.9 kg)  BMI 31.41 kg/m2  SpO2 97%  LMP 06/17/2011  Breastfeeding? Yes  General: alert, cooperative, flushed and mild distress Resp: clear to auscultation bilaterally GI: abnormal findings:  hypoactive bowel sounds, mild tenderness in the left flank and erythema, incision: serous drainage present and Hemovac temporarily off due to battery rundown, restarted and I am concerned that there may be extension of infection further into the right flank, Extremities: extremities normal, atraumatic, no cyanosis or edema and Homans sign is negative, no sign of DVT will likely explore further at next trip to OR, planned for the morning Monday  Assessment: s/p Procedure(s) (LRB) with comments: WOUND EXPLORATION (N/A) - Re-Exploration of Wound Abscess with Debridement APPLICATION OF WOUND VAC (N/A): stable, post-op fever and possible further extension into left flank of fasciitis/infection  Plan: dulcolax suppository, sit OOB today.  reexploration Monday, consider further  extension into rt flank,   LOS: 4 days    Jazzman Loughmiller V 01/06/2012, 7:14 AM

## 2012-01-06 NOTE — Progress Notes (Signed)
Pt to return to OR on 01/07/2012. Consent signed today (01/06/2012) and placed in front of chart.

## 2012-01-07 ENCOUNTER — Encounter (HOSPITAL_COMMUNITY): Payer: Self-pay | Admitting: Anesthesiology

## 2012-01-07 ENCOUNTER — Encounter (HOSPITAL_COMMUNITY): Payer: BC Managed Care – PPO

## 2012-01-07 ENCOUNTER — Inpatient Hospital Stay (HOSPITAL_COMMUNITY): Payer: BC Managed Care – PPO | Admitting: Anesthesiology

## 2012-01-07 ENCOUNTER — Encounter (HOSPITAL_COMMUNITY)
Admission: EM | Disposition: A | Discharge status: Home / Self Care | Payer: Self-pay | Source: Home / Self Care | Attending: Obstetrics and Gynecology

## 2012-01-07 ENCOUNTER — Inpatient Hospital Stay (HOSPITAL_COMMUNITY): Payer: BC Managed Care – PPO

## 2012-01-07 HISTORY — PX: APPLICATION OF WOUND VAC: SHX5189

## 2012-01-07 HISTORY — PX: WOUND EXPLORATION: SHX6188

## 2012-01-07 HISTORY — PX: INCISION AND DRAINAGE OF WOUND: SHX1803

## 2012-01-07 LAB — CBC WITH DIFFERENTIAL/PLATELET
Basophils Absolute: 0 10*3/uL (ref 0.0–0.1)
Basophils Relative: 0 % (ref 0–1)
Eosinophils Absolute: 0.1 10*3/uL (ref 0.0–0.7)
Eosinophils Relative: 1 % (ref 0–5)
HCT: 32.8 % — ABNORMAL LOW (ref 36.0–46.0)
Hemoglobin: 11.2 g/dL — ABNORMAL LOW (ref 12.0–15.0)
Lymphocytes Relative: 10 % — ABNORMAL LOW (ref 12–46)
Lymphs Abs: 1.5 10*3/uL (ref 0.7–4.0)
MCH: 28.6 pg (ref 26.0–34.0)
MCHC: 34.1 g/dL (ref 30.0–36.0)
MCV: 83.9 fL (ref 78.0–100.0)
Monocytes Absolute: 0.4 10*3/uL (ref 0.1–1.0)
Monocytes Relative: 3 % (ref 3–12)
Neutro Abs: 12.7 10*3/uL — ABNORMAL HIGH (ref 1.7–7.7)
Neutrophils Relative %: 86 % — ABNORMAL HIGH (ref 43–77)
Platelets: 500 10*3/uL — ABNORMAL HIGH (ref 150–400)
RBC: 3.91 MIL/uL (ref 3.87–5.11)
RDW: 15.1 % (ref 11.5–15.5)
WBC: 14.7 10*3/uL — ABNORMAL HIGH (ref 4.0–10.5)

## 2012-01-07 LAB — CULTURE, BLOOD (ROUTINE X 2)
Culture: NO GROWTH
Culture: NO GROWTH

## 2012-01-07 LAB — BASIC METABOLIC PANEL
BUN: 11 mg/dL (ref 6–23)
CO2: 26 mEq/L (ref 19–32)
Calcium: 8.1 mg/dL — ABNORMAL LOW (ref 8.4–10.5)
Chloride: 107 mEq/L (ref 96–112)
Creatinine, Ser: 0.8 mg/dL (ref 0.50–1.10)
GFR calc Af Amer: >90 mL/min (ref >90)
GFR calc non Af Amer: >90 mL/min (ref >90)
Glucose, Bld: 95 mg/dL (ref 70–99)
Potassium: 2.9 mEq/L — ABNORMAL LOW (ref 3.5–5.1)
Sodium: 144 mEq/L (ref 135–145)

## 2012-01-07 SURGERY — WOUND EXPLORATION
Anesthesia: General | Site: Abdomen | Wound class: Dirty or Infected

## 2012-01-07 MED ORDER — FENTANYL CITRATE 0.05 MG/ML IJ SOLN
INTRAMUSCULAR | Status: AC
  Filled 2012-01-07: qty 5

## 2012-01-07 MED ORDER — FENTANYL CITRATE 0.05 MG/ML IJ SOLN
INTRAMUSCULAR | Status: AC
  Filled 2012-01-07: qty 2

## 2012-01-07 MED ORDER — FENTANYL CITRATE 0.05 MG/ML IJ SOLN
25.0000 ug | INTRAMUSCULAR | Status: DC | PRN
  Administered 2012-01-07: 50 ug via INTRAVENOUS

## 2012-01-07 MED ORDER — ROCURONIUM BROMIDE 100 MG/10ML IV SOLN
INTRAVENOUS | Status: DC | PRN
  Administered 2012-01-07 (×2): 10 mg via INTRAVENOUS

## 2012-01-07 MED ORDER — ONDANSETRON HCL 4 MG/2ML IJ SOLN: 4.0000 mg | Freq: Once | INTRAMUSCULAR | Status: DC | PRN

## 2012-01-07 MED ORDER — DEXAMETHASONE SODIUM PHOSPHATE 4 MG/ML IJ SOLN
4.0000 mg | Freq: Once | INTRAMUSCULAR | Status: AC
  Administered 2012-01-07: 4 mg via INTRAVENOUS

## 2012-01-07 MED ORDER — PROPOFOL 10 MG/ML IV EMUL
INTRAVENOUS | Status: DC | PRN
  Administered 2012-01-07: 150 mg via INTRAVENOUS

## 2012-01-07 MED ORDER — BOOST / RESOURCE BREEZE PO LIQD
1.0000 | Freq: Three times a day (TID) | ORAL | Status: DC
  Administered 2012-01-07 – 2012-01-14 (×3): 1 via ORAL

## 2012-01-07 MED ORDER — MIDAZOLAM HCL 2 MG/2ML IJ SOLN
INTRAMUSCULAR | Status: AC
  Filled 2012-01-07: qty 2

## 2012-01-07 MED ORDER — ONDANSETRON HCL 4 MG/2ML IJ SOLN
INTRAMUSCULAR | Status: AC
  Filled 2012-01-07: qty 2

## 2012-01-07 MED ORDER — LIDOCAINE HCL (CARDIAC) 10 MG/ML IV SOLN
INTRAVENOUS | Status: DC | PRN
  Administered 2012-01-07: 50 mg via INTRAVENOUS

## 2012-01-07 MED ORDER — LACTATED RINGERS IV SOLN
INTRAVENOUS | Status: DC
  Administered 2012-01-07: 11:00:00 via INTRAVENOUS

## 2012-01-07 MED ORDER — ROCURONIUM BROMIDE 50 MG/5ML IV SOLN
INTRAVENOUS | Status: AC
  Filled 2012-01-07: qty 1

## 2012-01-07 MED ORDER — MIDAZOLAM HCL 2 MG/2ML IJ SOLN
1.0000 mg | INTRAMUSCULAR | Status: DC | PRN
  Administered 2012-01-07: 2 mg via INTRAVENOUS

## 2012-01-07 MED ORDER — PROPOFOL 10 MG/ML IV EMUL
INTRAVENOUS | Status: AC
  Filled 2012-01-07: qty 20

## 2012-01-07 MED ORDER — FUROSEMIDE 10 MG/ML IJ SOLN
20.0000 mg | Freq: Once | INTRAMUSCULAR | Status: AC
  Administered 2012-01-07: 20 mg via INTRAVENOUS
  Filled 2012-01-07: qty 2

## 2012-01-07 MED ORDER — LACTATED RINGERS IV SOLN
INTRAVENOUS | Status: DC | PRN
  Administered 2012-01-07: 11:00:00 via INTRAVENOUS

## 2012-01-07 MED ORDER — POTASSIUM CHLORIDE 10 MEQ/100ML IV SOLN
10.0000 meq | INTRAVENOUS | Status: AC
  Administered 2012-01-07 (×2): 10 meq via INTRAVENOUS

## 2012-01-07 MED ORDER — PRO-STAT SUGAR FREE PO LIQD
30.0000 mL | Freq: Three times a day (TID) | ORAL | Status: DC
  Administered 2012-01-07 – 2012-01-13 (×11): 30 mL via ORAL
  Filled 2012-01-07 (×13): qty 30

## 2012-01-07 MED ORDER — SUCCINYLCHOLINE CHLORIDE 20 MG/ML IJ SOLN
INTRAMUSCULAR | Status: DC | PRN
  Administered 2012-01-07: 120 mg via INTRAVENOUS

## 2012-01-07 MED ORDER — 0.9 % SODIUM CHLORIDE (POUR BTL) OPTIME
TOPICAL | Status: DC | PRN
  Administered 2012-01-07: 2000 mL

## 2012-01-07 MED ORDER — ARTIFICIAL TEARS OP OINT
TOPICAL_OINTMENT | OPHTHALMIC | Status: AC
  Filled 2012-01-07: qty 3.5

## 2012-01-07 MED ORDER — POTASSIUM CHLORIDE CRYS ER 10 MEQ PO TBCR
10.0000 meq | EXTENDED_RELEASE_TABLET | Freq: Two times a day (BID) | ORAL | Status: DC
  Administered 2012-01-07: 10 meq via ORAL
  Filled 2012-01-07 (×2): qty 1

## 2012-01-07 MED ORDER — GLYCOPYRROLATE 0.2 MG/ML IJ SOLN
INTRAMUSCULAR | Status: AC
  Filled 2012-01-07: qty 3

## 2012-01-07 MED ORDER — SCOPOLAMINE 1 MG/3DAYS TD PT72
MEDICATED_PATCH | TRANSDERMAL | Status: AC
  Filled 2012-01-07: qty 1

## 2012-01-07 MED ORDER — LIDOCAINE HCL (PF) 1 % IJ SOLN
INTRAMUSCULAR | Status: AC
  Filled 2012-01-07: qty 5

## 2012-01-07 MED ORDER — GLYCOPYRROLATE 0.2 MG/ML IJ SOLN
INTRAMUSCULAR | Status: DC | PRN
  Administered 2012-01-07: 0.6 mg via INTRAVENOUS

## 2012-01-07 MED ORDER — SODIUM CHLORIDE 0.9 % IJ SOLN: 10.0000 mL | INTRAMUSCULAR | Status: DC | PRN

## 2012-01-07 MED ORDER — FENTANYL CITRATE 0.05 MG/ML IJ SOLN
INTRAMUSCULAR | Status: DC | PRN
  Administered 2012-01-07 (×2): 100 ug via INTRAVENOUS
  Administered 2012-01-07 (×2): 50 ug via INTRAVENOUS
  Administered 2012-01-07: 100 ug via INTRAVENOUS

## 2012-01-07 MED ORDER — SODIUM CHLORIDE 0.9 % IJ SOLN
10.0000 mL | Freq: Two times a day (BID) | INTRAMUSCULAR | Status: DC
  Administered 2012-01-07: 10 mL
  Administered 2012-01-08 – 2012-01-09 (×2): 20 mL
  Administered 2012-01-09 – 2012-01-14 (×8): 10 mL

## 2012-01-07 MED ORDER — SCOPOLAMINE 1 MG/3DAYS TD PT72
1.0000 | MEDICATED_PATCH | Freq: Once | TRANSDERMAL | Status: DC
  Administered 2012-01-07: 1.5 mg via TRANSDERMAL

## 2012-01-07 MED ORDER — NEOSTIGMINE METHYLSULFATE 1 MG/ML IJ SOLN
INTRAMUSCULAR | Status: DC | PRN
  Administered 2012-01-07: 4 mg via INTRAVENOUS

## 2012-01-07 MED ORDER — DEXAMETHASONE SODIUM PHOSPHATE 4 MG/ML IJ SOLN
INTRAMUSCULAR | Status: AC
  Filled 2012-01-07: qty 1

## 2012-01-07 MED ORDER — SUCCINYLCHOLINE CHLORIDE 20 MG/ML IJ SOLN
INTRAMUSCULAR | Status: AC
  Filled 2012-01-07: qty 1

## 2012-01-07 MED ORDER — POTASSIUM CHLORIDE 10 MEQ/100ML IV SOLN
INTRAVENOUS | Status: AC
  Administered 2012-01-07: 10 meq via INTRAVENOUS
  Filled 2012-01-07: qty 200

## 2012-01-07 MED ORDER — ONDANSETRON HCL 4 MG/2ML IJ SOLN
4.0000 mg | Freq: Once | INTRAMUSCULAR | Status: AC
  Administered 2012-01-07: 4 mg via INTRAVENOUS

## 2012-01-07 SURGICAL SUPPLY — 29 items
BAG HAMPER (MISCELLANEOUS) ×2 IMPLANT
CANISTER WOUND CARE 500ML ATS (WOUND CARE) ×1 IMPLANT
CLOTH BEACON ORANGE TIMEOUT ST (SAFETY) ×2 IMPLANT
COVER LIGHT HANDLE STERIS (MISCELLANEOUS) ×4 IMPLANT
DRSG VAC ATS MED SENSATRAC (GAUZE/BANDAGES/DRESSINGS) ×1 IMPLANT
ELECT REM PT RETURN 9FT ADLT (ELECTROSURGICAL) ×2
ELECTRODE REM PT RTRN 9FT ADLT (ELECTROSURGICAL) ×1 IMPLANT
GLOVE BIOGEL PI IND STRL 7.0 (GLOVE) IMPLANT
GLOVE BIOGEL PI IND STRL 7.5 (GLOVE) IMPLANT
GLOVE BIOGEL PI INDICATOR 7.0 (GLOVE) ×1
GLOVE BIOGEL PI INDICATOR 7.5 (GLOVE) ×1
GLOVE ECLIPSE 7.0 STRL STRAW (GLOVE) ×1 IMPLANT
GLOVE ECLIPSE 9.0 STRL (GLOVE) ×2 IMPLANT
GLOVE EXAM NITRILE MD LF STRL (GLOVE) ×2 IMPLANT
GLOVE INDICATOR STER SZ 9 (GLOVE) ×2 IMPLANT
GLOVE SS BIOGEL STRL SZ 6.5 (GLOVE) IMPLANT
GLOVE SUPERSENSE BIOGEL SZ 6.5 (GLOVE) ×1
GOWN STRL REIN 3XL LVL4 (GOWN DISPOSABLE) ×4 IMPLANT
INST SET MAJOR GENERAL (KITS) ×2 IMPLANT
KIT ROOM TURNOVER APOR (KITS) ×2 IMPLANT
MANIFOLD NEPTUNE II (INSTRUMENTS) ×2 IMPLANT
NS IRRIG 1000ML POUR BTL (IV SOLUTION) ×3 IMPLANT
PACK ABDOMINAL MAJOR (CUSTOM PROCEDURE TRAY) ×2 IMPLANT
PAD ARMBOARD 7.5X6 YLW CONV (MISCELLANEOUS) ×2 IMPLANT
SET BASIN LINEN APH (SET/KITS/TRAYS/PACK) ×2 IMPLANT
SOL PREP PROV IODINE SCRUB 4OZ (MISCELLANEOUS) ×2 IMPLANT
SPONGE GAUZE 2X2 8PLY STRL LF (GAUZE/BANDAGES/DRESSINGS) ×1 IMPLANT
SPONGE GAUZE 4X4 12PLY (GAUZE/BANDAGES/DRESSINGS) ×2 IMPLANT
SUT PROLENE 2 0 SH 30 (SUTURE) ×1 IMPLANT

## 2012-01-07 NOTE — Anesthesia Procedure Notes (Signed)
Procedure Name: Intubation Date/Time: 01/07/2012 11:15 AM Performed by: Carolyne Littles, Lai Hendriks L Pre-anesthesia Checklist: Patient identified, Patient being monitored, Timeout performed, Emergency Drugs available and Suction available Patient Re-evaluated:Patient Re-evaluated prior to inductionOxygen Delivery Method: Circle System Utilized Preoxygenation: Pre-oxygenation with 100% oxygen Intubation Type: IV induction, Rapid sequence and Cricoid Pressure applied Laryngoscope Size: 3 and Miller Grade View: Grade I Tube type: Oral Tube size: 7.0 mm Number of attempts: 1 Airway Equipment and Method: stylet Placement Confirmation: ETT inserted through vocal cords under direct vision,  positive ETCO2 and breath sounds checked- equal and bilateral Secured at: 21 cm Tube secured with: Tape Dental Injury: Teeth and Oropharynx as per pre-operative assessment

## 2012-01-07 NOTE — Anesthesia Preprocedure Evaluation (Signed)
Anesthesia Evaluation  Patient identified by MRN, date of birth, ID band Patient awake    Reviewed: Allergy & Precautions, H&P , NPO status , Patient's Chart, lab work & pertinent test results  History of Anesthesia Complications (+) PONV  Airway Mallampati: II  Neck ROM: Full    Dental  (+) Teeth Intact   Pulmonary  breath sounds clear to auscultation        Cardiovascular Rhythm:Regular     Neuro/Psych    GI/Hepatic GERD-  Controlled,  Endo/Other    Renal/GU      Musculoskeletal   Abdominal   Peds  Hematology   Anesthesia Other Findings   Reproductive/Obstetrics Csection [redacted]weeks gestation; transverse lie                           Anesthesia Physical Anesthesia Plan  ASA: III  Anesthesia Plan: General   Post-op Pain Management:    Induction: Intravenous, Rapid sequence and Cricoid pressure planned  Airway Management Planned: Oral ETT  Additional Equipment:   Intra-op Plan:   Post-operative Plan: Extubation in OR  Informed Consent: I have reviewed the patients History and Physical, chart, labs and discussed the procedure including the risks, benefits and alternatives for the proposed anesthesia with the patient or authorized representative who has indicated his/her understanding and acceptance.   Dental advisory given  Plan Discussed with: Anesthesiologist and Surgeon  Anesthesia Plan Comments:         Anesthesia Quick Evaluation  

## 2012-01-07 NOTE — Op Note (Signed)
Patient:  Kara Gentry  DOB:  January 17, 1976  MRN:  [id number]   Preop Diagnosis:  Abdominal wall necrotizing fasciitis  Postop Diagnosis:  The same  Procedure:  Wound exploration and additional sharp surgical debridement, wound VAC placement  Surgeon:  Drs. Tilford Pillar, Christin Bach  Anes:  General endotracheal  Indications:  Patient is an unfortunate 36 year old female who recently underwent emergent cesarean section and developed a intrauterine abscess with progression and development of necrotizing fasciitis. She returns to the operating room today for additional exploration and reevaluation of the wound. Risks benefits alternatives were discussed.  Procedure note:  Please see Dr. Rayna Sexton dictation for the complete procedure note. Patient was taken back to the operating room for reexploration. Once that had been taken down and some additional areas of necrosis were encountered. Most of this was noted to laterally on both sides. An additional skin incision was created with additional debridement of the soft tissue. The previously placed piece of [employer] biological mesh was removed for evaluation of the underlying viscera. While there did appear to be an old hematoma the remainder of the wound. Healthy. The MDF was replaced again using a running 2-0 Prolene suture.  Several small pial defects were created within the mesh to allow for additional drainage. The JP drain was reinserted through the anterior normal wall and packed into the open uterus and pelvis prior to closure of the biological mesh. The wound VAC was replaced. The skin attached to negative pressure and good suction was obtained. With a good seal the patient was again allowed to come out of general anesthetic. She was transferred to the PACU in stable condition. All instruments, needles, sponge counts were correct. Again please see Dr. Rayna Sexton operative report for complete dictation.   Complications:  None apparent  EBL:   Less than 100 ML's  Specimen:  None

## 2012-01-07 NOTE — Anesthesia Postprocedure Evaluation (Signed)
  Anesthesia Post-op Note  Patient: Kara Gentry  Procedure(s) Performed: Procedure(s) (LRB) with comments: WOUND EXPLORATION () IRRIGATION AND DEBRIDEMENT WOUND () APPLICATION OF WOUND VAC ()  Patient Location: PACU  Anesthesia Type: General  Level of Consciousness: awake, alert , oriented and patient cooperative  Airway and Oxygen Therapy: Patient Spontanous Breathing and Patient connected to face mask oxygen  Post-op Pain: mild  Post-op Assessment: Post-op Vital signs reviewed, Patient's Cardiovascular Status Stable, Respiratory Function Stable, Patent Airway and No signs of Nausea or vomiting  Post-op Vital Signs: Reviewed and stable  Complications: No apparent anesthesia complications  

## 2012-01-07 NOTE — Op Note (Signed)
Patient:  Kara Gentry  DOB:  [birth year]  MRN:  [id number]   Preop Diagnosis:   necrotizing fasciitis of the abdominal wall  Postop Diagnosis:  The same  Procedure:   reexploration of abdominal wall and wounds. Additional sharp surgical debridement of necrotic tissue. Wound VAC replacement.  Surgeon:  [facility]ctors Christin Bach, Tilford Pillar  Anes:   general endotracheal  Indications:   patient is a 36 year old female with a history of necrotizing fasciitis following a emergent cesarean section and uterine abscess. She returns to the operating room today for reexploration and confirmation of viable tissue. Again risks benefits alternatives were discussed with the patient.   Procedure note:  Please see Dr. Rayna Sexton note for the complete operative report.  Patient returned to the operating room. A VAC was removed. The majority of the soft tissue and fascia appeared viable and healthy. There is small islands of necrotic tissue which were sharply dissected using sharp surgical dissection. The M.D. F mesh was freed to allow visualization of the pelvis. The underlying viscera appeared viable. There is no evidence of any persistent abscess within the pelvis. The wound was copiously irrigated. The M.D. F mesh was secured to the fascia circumferentially with the 2-0 Prolene suture in a running continuous fashion. The VAC sponge was again trimmed to appropriate size and reinserted into the 5 wounds. A bridging bone is utilized to connect all 5 areas. Negative pressure was applied. The suction was maintained without any evidence of a leak. The drapes removed patient left come by anesthesia and stretcher to the PACU in stable condition. At the conclusion of procedure all instrument, sponge, needle counts are correct. Patient tolerated procedure well.   Complications:   none apparent   EBL:  Less than 100 mL   Specimen:   none

## 2012-01-07 NOTE — Progress Notes (Signed)
Day of Surgery Procedure(s) (LRB): WOUND EXPLORATION (N/A)date of 3rd planned wound reexploration.   Subjective: Patient reports incisional pain, tolerating PO, + flatus and + BM.    Objective: I have reviewed patient's vital signs, medications, labs and microbiology.  General: alert, cooperative and mild distress Resp: clear to auscultation bilaterally GI: normal findings: bowel sounds normal the lateral flank areas remain indurated, less so than before but suspicious for additional extension of abscess fluid. CBC    Component Value Date/Time   WBC 14.7* 01/07/2012 0350   RBC 3.91 01/07/2012 0350   HGB 11.2* 01/07/2012 0350   HCT 32.8* 01/07/2012 0350   PLT 500* 01/07/2012 0350   MCV 83.9 01/07/2012 0350   MCH 28.6 01/07/2012 0350   MCHC 34.1 01/07/2012 0350   RDW 15.1 01/07/2012 0350   LYMPHSABS 1.5 01/07/2012 0350   MONOABS 0.4 01/07/2012 0350   EOSABS 0.1 01/07/2012 0350   BASOSABS 0.0 01/07/2012 0350    Assessment: s/p Procedure(s) (LRB) with comments: WOUND EXPLORATION (N/A): stable and scheduled for reexploration  Plan: NPO to OR again this a.m., may need additional drainage sites in lateral flanks.  LOS: 5 days    Sheila Ocasio V 01/07/2012, 10:40 AM

## 2012-01-07 NOTE — Addendum Note (Signed)
Addendum  created 01/07/12 1237 by Juandedios Dudash L Andraza, CRNA   Modules edited:Anesthesia Medication Administration    

## 2012-01-07 NOTE — Transfer of Care (Signed)
  Anesthesia Post-op Note  Patient: Kara Gentry  Procedure(s) Performed: Procedure(s) (LRB) with comments: WOUND EXPLORATION () IRRIGATION AND DEBRIDEMENT WOUND () APPLICATION OF WOUND VAC ()  Patient Location: PACU  Anesthesia Type: General  Level of Consciousness: awake, alert , oriented and patient cooperative  Airway and Oxygen Therapy: Patient Spontanous Breathing and Patient connected to face mask oxygen  Post-op Pain: mild  Post-op Assessment: Post-op Vital signs reviewed, Patient's Cardiovascular Status Stable, Respiratory Function Stable, Patent Airway and No signs of Nausea or vomiting  Post-op Vital Signs: Reviewed and stable  Complications: No apparent anesthesia complications

## 2012-01-07 NOTE — Progress Notes (Signed)
INITIAL ADULT NUTRITION ASSESSMENT Date: 01/07/2012   Time: 10:45 AM Reason for Assessment: Consult for poor po intake and wound healing  INTERVENTION: Will follow for diet advancement. When diet is advanced, add:  -Resource Breeze po TID, each supplement provides 250 kcal and 9 grams of protein. -30 ml Prostat TID, each supplement provides 100 kcals and 15 grams of protein Supplement regimen to provide 1050 kcals and 72 grams of protein (45% of estimated energy needs and 74% of estimated protein needs)  ASSESSMENT: Female 36 y.o.  Dx: Postoperative wound abscess  Hx:  Past Medical History  Diagnosis Date  . Seasonal allergies   . No pertinent past medical history   . PONV (postoperative nausea and vomiting)    Past Surgical History  Procedure Date  . Ectopic pregnancy surgery   . Wrist surgery   . Mandible surgery cancer  . Nasal sinus surgery   . Cesarean section 12/22/2011    Procedure: CESAREAN SECTION;  Surgeon: Reva Bores, MD;  Location: [facility];  Service: Obstetrics;  Laterality: N/A;  Primary cesarean section with delivery of Baby A girl at 29. Baby B girl at 63.   Related Meds:  Scheduled Meds:   . [facility] HOLD] bisacodyl  10 mg Rectal Once  . [MAR HOLD] clindamycin (CLEOCIN) IV  900 mg Intravenous Q8H  . [COMPLETED] dexamethasone  4 mg Intravenous Once  . [MAR HOLD] HYDROmorphone PCA 0.3 mg/mL   Intravenous Q4H  . [COMPLETED] ondansetron (ZOFRAN) IV  4 mg Intravenous Once  . [MAR HOLD] piperacillin-tazobactam (ZOSYN)  IV  3.375 g Intravenous Q8H  . scopolamine  1 patch Transdermal Once  . [facility] HOLD] sodium chloride  10-40 mL Intracatheter Q12H  . [COMPLETED] scopolamine  1 patch Transdermal Once   Continuous Infusions:   . dextrose 5% lactated ringers 75 mL/hr at 01/07/12 1000  . lactated ringers 75 mL/hr at 01/07/12 1054   PRN Meds:.[MAR HOLD] acetaminophen, [MAR HOLD] diphenhydrAMINE, [MAR HOLD] diphenhydrAMINE, [MAR HOLD] menthol-cetylpyridinium,  midazolam, [MAR HOLD] naloxone, [MAR HOLD] ondansetron (ZOFRAN) IV, [MAR HOLD] sodium chloride, [MAR HOLD] sodium chloride  Ht: 5\' 2"  (157.5 cm)  Wt: 171 lb 11.8 oz (77.9 kg)  Ideal Wt: 50.1 kg  % Ideal Wt: 155%  Usual Wt: 164# % Usual Wt: 104%  Body mass index is 31.41 kg/(m^2). Classified as obesity, class I.   Wt Readings from Last 10 Encounters:  01/06/12 171 lb 11.8 oz (77.9 kg)  01/06/12 171 lb 11.8 oz (77.9 kg)  01/06/12 171 lb 11.8 oz (77.9 kg)  01/06/12 171 lb 11.8 oz (77.9 kg)  12/24/11 164 lb (74.39 kg)  12/24/11 164 lb (74.39 kg)  12/16/11 160 lb (72.576 kg)  10/13/11 160 lb 3.2 oz (72.666 kg)  06/27/10 164 lb (74.39 kg)   Food/Nutrition Related Hx: Pt and family unavailable at time of visit. Pt currently in sx. Chart reviewed.  Pt s/p C-Section at [facility] on 12/22/11. Pt developed endometritis and wound abscess s/p Cesarean and had sx on 01/03/12. Sx today for more extensive debridement. Pt with wound vac.  Pt current NPO. Previously on a clear liquid diet, with poor PO intake (PO: 25-35%). Weight hx does not indicated any recent weight loss.  Pt does not meet criteria for malnutrition at this time, but is at risk for malnutrition due to poor po intake and increased nutrient needs due to wound healing.   Labs:  CMP     Component Value Date/Time   NA 144 01/07/2012 0350  K 2.9* 01/07/2012 0350   CL 107 01/07/2012 0350   CO2 26 01/07/2012 0350   GLUCOSE 95 01/07/2012 0350   BUN 11 01/07/2012 0350   CREATININE 0.80 01/07/2012 0350   CALCIUM 8.1* 01/07/2012 0350   PROT 5.4* 01/04/2012 0517   ALBUMIN 1.3* 01/04/2012 0517   AST 13 01/04/2012 0517   ALT 17 01/04/2012 0517   ALKPHOS 96 01/04/2012 0517   BILITOT 0.9 01/04/2012 0517   GFRNONAA >90 01/07/2012 0350   GFRAA >90 01/07/2012 0350   Intake:   Intake/Output Summary (Last 24 hours) at 01/07/12 1102 Last data filed at 01/07/12 1000  Gross per 24 hour  Intake   2312 ml  Output   3330 ml  Net  -1018 ml     Diet Order: NPO  Supplements/Tube Feeding: none at this time  IVF:    dextrose 5% lactated ringers Last Rate: 75 mL/hr at 01/07/12 1000  lactated ringers Last Rate: 75 mL/hr at 01/07/12 1054    Estimated Nutritional Needs:   Kcal:2332-2720 kcals daily Protein:97-117 grams protein daily Fluid:2.3-2.7 L fluid daily  NUTRITION DIAGNOSIS: -Inadequate oral intake (NI-2.1).  Status: Ongoing  RELATED TO: NPO, increased nutrient needs  AS EVIDENCE BY: wound healing, PO: 25-35%.   MONITORING/EVALUATION(Goals): 1) Pt will maintain current weight of 171# 2) Pt will be advanced to PO diet as medically appropriate 3) Pt will meet >75% of estimated energy and protein needs  EDUCATION NEEDS: -Education not appropriate at this time  Dietitian #: [phone]  DOCUMENTATION CODES Per approved criteria  -Obesity Unspecified    Melody Haver, RD, LDN 01/07/2012, 10:45 AM

## 2012-01-07 NOTE — Progress Notes (Signed)
PICC LINE PLACEMENT WAS BEEN VERIFIED. OR TEAM HAS COME TO TAKE PT TO SURGERY. PT SLIGHTLY ANXIOUS.MUCH ENCOURAGEMENT,EMOTIONAL AND SPIRITUAL SUPPORT GIVEN.

## 2012-01-07 NOTE — Progress Notes (Signed)
Brief Nutrition Note  See previous initial assessment dated 01/07/12 for more details. Noted diet upgraded to regular. Resource Breeze and Prostat TID added. Will continue to follow.  Melody Haver, RD, LDN Pager: [phone]

## 2012-01-07 NOTE — Preoperative (Signed)
Beta Blockers   Reason not to administer Beta Blockers:Not Applicable 

## 2012-01-07 NOTE — Addendum Note (Signed)
Addendum  created 01/07/12 1237 by Marolyn Hammock, CRNA   Modules edited:Anesthesia Medication Administration

## 2012-01-07 NOTE — Brief Op Note (Signed)
01/02/2012 - 01/07/2012  12:22 PM  PATIENT:  Kara Gentry  36 y.o. female  PRE-OPERATIVE DIAGNOSIS:  wound abscess, necrotizing fasciitis  POST-OPERATIVE DIAGNOSIS:  wound abscess, necrotizing fasciitis  PROCEDURE:  Procedure(s) (LRB) with comments: WOUND EXPLORATION () IRRIGATION AND DEBRIDEMENT WOUND () APPLICATION OF WOUND VAC ()  SURGEON:  Surgeon(s) and Role:    * Tilda Burrow, MD - assist     * Fabio Bering, MD - primary  PHYSICIAN ASSISTANT:   ASSISTANTS: Witt CST   ANESTHESIA:   general  EBL:  Total I/O In: 225 [I.V.:225] Out: 850 [Urine:800; Blood:50]  BLOOD ADMINISTERED:none  DRAINS: woundvac in the 5 incision sites   LOCAL MEDICATIONS USED:  NONE  SPECIMEN:  No Specimen  DISPOSITION OF SPECIMEN:  N/A  COUNTS:  YES  TOURNIQUET:  * No tourniquets in log *  DICTATION: .Dragon Dictation by dr Girtha Rm  PLAN OF CARE: Admit to inpatient   PATIENT DISPOSITION:  PACU - hemodynamically stable.   Delay start of Pharmacological VTE agent (>24hrs) due to surgical blood loss or risk of bleeding: not applicable

## 2012-01-07 NOTE — Progress Notes (Signed)
PICC LINE PLACED.

## 2012-01-08 ENCOUNTER — Encounter (HOSPITAL_COMMUNITY): Payer: Self-pay | Admitting: Obstetrics and Gynecology

## 2012-01-08 LAB — CBC WITH DIFFERENTIAL/PLATELET
Basophils Absolute: 0 10*3/uL (ref 0.0–0.1)
Basophils Relative: 0 % (ref 0–1)
Eosinophils Absolute: 0.1 10*3/uL (ref 0.0–0.7)
Eosinophils Relative: 1 % (ref 0–5)
HCT: 32.7 % — ABNORMAL LOW (ref 36.0–46.0)
Hemoglobin: 11 g/dL — ABNORMAL LOW (ref 12.0–15.0)
Lymphocytes Relative: 16 % (ref 12–46)
Lymphs Abs: 1.8 10*3/uL (ref 0.7–4.0)
MCH: 28.8 pg (ref 26.0–34.0)
MCHC: 33.6 g/dL (ref 30.0–36.0)
MCV: 85.6 fL (ref 78.0–100.0)
Monocytes Absolute: 0.6 10*3/uL (ref 0.1–1.0)
Monocytes Relative: 5 % (ref 3–12)
Neutro Abs: 8.9 10*3/uL — ABNORMAL HIGH (ref 1.7–7.7)
Neutrophils Relative %: 78 % — ABNORMAL HIGH (ref 43–77)
Platelets: 470 10*3/uL — ABNORMAL HIGH (ref 150–400)
RBC: 3.82 MIL/uL — ABNORMAL LOW (ref 3.87–5.11)
RDW: 15.2 % (ref 11.5–15.5)
WBC: 11.4 10*3/uL — ABNORMAL HIGH (ref 4.0–10.5)

## 2012-01-08 LAB — BASIC METABOLIC PANEL
BUN: 12 mg/dL (ref 6–23)
CO2: 29 mEq/L (ref 19–32)
Calcium: 8 mg/dL — ABNORMAL LOW (ref 8.4–10.5)
Chloride: 105 mEq/L (ref 96–112)
Creatinine, Ser: 0.8 mg/dL (ref 0.50–1.10)
GFR calc Af Amer: >90 mL/min (ref >90)
GFR calc non Af Amer: >90 mL/min (ref >90)
Glucose, Bld: 97 mg/dL (ref 70–99)
Potassium: 2.8 mEq/L — ABNORMAL LOW (ref 3.5–5.1)
Sodium: 142 mEq/L (ref 135–145)

## 2012-01-08 LAB — ANAEROBIC CULTURE

## 2012-01-08 MED ORDER — POTASSIUM CHLORIDE CRYS ER 20 MEQ PO TBCR
20.0000 meq | EXTENDED_RELEASE_TABLET | Freq: Two times a day (BID) | ORAL | Status: DC
  Administered 2012-01-08 – 2012-01-14 (×12): 20 meq via ORAL
  Filled 2012-01-08 (×11): qty 1

## 2012-01-08 MED ORDER — HYDROMORPHONE HCL 2 MG PO TABS: 2.0000 mg | ORAL_TABLET | ORAL | Status: DC | PRN

## 2012-01-08 NOTE — Progress Notes (Signed)
Dr Emelda Fear paged and made aware that pts K+ was 2.8 this am and pt only receiving BID. New order received to change dose to BID.

## 2012-01-08 NOTE — Progress Notes (Signed)
1 Day Post-Op Procedure(s) (LRB): WOUND EXPLORATION () IRRIGATION AND DEBRIDEMENT WOUND () APPLICATION OF WOUND VAC ()3rd debridement  Subjective: Patient reports tolerating PO, + flatus and + BM.  She rates the pain at a 1-2 now.  Objective: I have reviewed patient's vital signs, intake and output, medications, labs and microbiology.  General: alert, cooperative and no distress Resp: clear to auscultation bilaterally GI: normal findings: bowel sounds normal, abnormal findings:  flank areas remain indurated/edematous, but no erythema or tenderness and incision: serous drainage present mbeing drawn out nicely by Wound Vac Assessment: s/p Procedure(s) (LRB) with comments: WOUND EXPLORATION () IRRIGATION AND DEBRIDEMENT WOUND () APPLICATION OF WOUND VAC (): stable, progressing well and tolerating diet  Plan: Encourage ambulation d/c foley. Switch to PO analgesic, change wound vac on floor in a.m.  LOS: 6 days    Kara Gentry V 01/08/2012, 8:48 AM

## 2012-01-08 NOTE — Progress Notes (Signed)
UR Chart Review Completed  

## 2012-01-08 NOTE — Addendum Note (Signed)
Addendum  created 01/08/12 1340 by Marolyn Hammock, CRNA   Modules edited:Notes Section

## 2012-01-08 NOTE — Anesthesia Postprocedure Evaluation (Signed)
  Anesthesia Post-op Note  Patient: Kara Gentry  Procedure(s) Performed: Procedure(s) (LRB) with comments: WOUND EXPLORATION () IRRIGATION AND DEBRIDEMENT WOUND () APPLICATION OF WOUND VAC ()  Patient Location: Room ICU 11  Anesthesia Type: General  Level of Consciousness: awake, alert , oriented and patient cooperative  Airway and Oxygen Therapy: Patient Spontanous Breathing O2 via nasal cannula  Post-op Pain: mild  Post-op Assessment: Post-op Vital signs reviewed, Patient's Cardiovascular Status Stable, Respiratory Function Stable, Patent Airway and No signs of Nausea or vomiting  Post-op Vital Signs: Reviewed and stable  Complications: No apparent anesthesia complications

## 2012-01-09 ENCOUNTER — Encounter (HOSPITAL_COMMUNITY): Payer: Self-pay | Admitting: Obstetrics and Gynecology

## 2012-01-09 LAB — BASIC METABOLIC PANEL
BUN: 9 mg/dL (ref 6–23)
CO2: 29 mEq/L (ref 19–32)
Calcium: 8.2 mg/dL — ABNORMAL LOW (ref 8.4–10.5)
Chloride: 105 mEq/L (ref 96–112)
Creatinine, Ser: 0.76 mg/dL (ref 0.50–1.10)
GFR calc Af Amer: >90 mL/min (ref >90)
GFR calc non Af Amer: >90 mL/min (ref >90)
Glucose, Bld: 127 mg/dL — ABNORMAL HIGH (ref 70–99)
Potassium: 3.1 mEq/L — ABNORMAL LOW (ref 3.5–5.1)
Sodium: 142 mEq/L (ref 135–145)

## 2012-01-09 LAB — CBC WITH DIFFERENTIAL/PLATELET
Basophils Absolute: 0 10*3/uL (ref 0.0–0.1)
Basophils Relative: 0 % (ref 0–1)
Eosinophils Absolute: 0.1 10*3/uL (ref 0.0–0.7)
Eosinophils Relative: 1 % (ref 0–5)
HCT: 33 % — ABNORMAL LOW (ref 36.0–46.0)
Hemoglobin: 11.1 g/dL — ABNORMAL LOW (ref 12.0–15.0)
Lymphocytes Relative: 13 % (ref 12–46)
Lymphs Abs: 1.6 10*3/uL (ref 0.7–4.0)
MCH: 29 pg (ref 26.0–34.0)
MCHC: 33.6 g/dL (ref 30.0–36.0)
MCV: 86.2 fL (ref 78.0–100.0)
Monocytes Absolute: 0.5 10*3/uL (ref 0.1–1.0)
Monocytes Relative: 4 % (ref 3–12)
Neutro Abs: 10.2 10*3/uL — ABNORMAL HIGH (ref 1.7–7.7)
Neutrophils Relative %: 82 % — ABNORMAL HIGH (ref 43–77)
Platelets: 452 10*3/uL — ABNORMAL HIGH (ref 150–400)
RBC: 3.83 MIL/uL — ABNORMAL LOW (ref 3.87–5.11)
RDW: 15.1 % (ref 11.5–15.5)
WBC: 12.5 10*3/uL — ABNORMAL HIGH (ref 4.0–10.5)

## 2012-01-09 MED ORDER — HYDROMORPHONE HCL PF 1 MG/ML IJ SOLN
INTRAMUSCULAR | Status: AC
  Administered 2012-01-09: 2 mg via INTRAVENOUS
  Filled 2012-01-09: qty 2

## 2012-01-09 MED ORDER — HYDROMORPHONE HCL PF 1 MG/ML IJ SOLN
2.0000 mg | Freq: Once | INTRAMUSCULAR | Status: AC
  Administered 2012-01-09: 2 mg via INTRAVENOUS

## 2012-01-09 NOTE — Progress Notes (Signed)
Pt. Spiked temp of 101.7. MD made aware, tylenol given. No further orders given.

## 2012-01-09 NOTE — Progress Notes (Signed)
POD: 5, 3, 1  Subjective: Pain control. Tolerating diet. No fevers or chills. No complaints of significant abdominal discomfort.   Objective: Vital signs in last 24 hours: Temp:  [98.6 F (37 C)-99.1 F (37.3 C)] 99.1 F (37.3 C) (11/20 0456) Pulse Rate:  [66-99] 90  (11/20 0600) Resp:  [10-29] 23  (11/20 0600) BP: (96-116)/(62-80) 106/73 mmHg (11/20 0600) SpO2:  [94 %-100 %] 96 % (11/20 0600) Weight:  [74.5 kg (164 lb 3.9 oz)] 74.5 kg (164 lb 3.9 oz) (11/20 0456) Last BM Date: 01/08/12  Intake/Output from previous day: 11/19 0701 - 11/20 0700 In: 2411.3 [P.O.:900; I.V.:1211.3; IV Piggyback:300] Out: 1075 [Urine:900; Drains:175] Intake/Output this shift:    General appearance: alert and no distress GI: Positive bowel sounds, soft, mild to moderate tenderness around the wound VAC. No diffuse peritoneal signs. No abdominal wall erythema. Wound VAC is in place with maintained negative pressure.  Lab Results:   WBC: ~11,000  PT/INR No results found for this basename: LABPROT:2,INR:2 in the last 72 hours ABG No results found for this basename: PHART:2,PCO2:2,PO2:2,HCO3:2 in the last 72 hours  Studies/Results: Dg Chest Port 1 View  01/07/2012  *RADIOLOGY REPORT*  Clinical Data: PICC placement.  PORTABLE CHEST - 1 VIEW  Comparison: 06/26/2011.  Findings: Right PICC tip projects over the SVC/RA junction or high right atrium.  Trachea is midline.  Heart size is accentuated by low lung volumes.  Bibasilar air space disease, left greater than right.  IMPRESSION:  1.  Right PICC tip projects over the SVC/RA junction or high right atrium.  Retracting approximately 2 cm would better position the tip at the SVC or SVC/RA junction. 2.  Bibasilar air space disease, left greater than right.   Original Report Authenticated By: Leanna Battles, M.D.     Anti-infectives: Anti-infectives     Start     Dose/Rate Route Frequency Ordered Stop   01/03/12 1800   piperacillin-tazobactam (ZOSYN) IVPB  3.375 g  Status:  Discontinued        3.375 g 100 mL/hr over 30 Minutes Intravenous 4 times per day 01/03/12 1550 01/03/12 1557   01/03/12 1800  piperacillin-tazobactam (ZOSYN) IVPB 3.375 g       3.375 g 12.5 mL/hr over 240 Minutes Intravenous Every 8 hours 01/03/12 1556     01/03/12 1600   clindamycin (CLEOCIN) IVPB 900 mg        900 mg 100 mL/hr over 30 Minutes Intravenous 3 times per day 01/03/12 1550     01/02/12 2230   piperacillin-tazobactam (ZOSYN) IVPB 3.375 g  Status:  Discontinued        3.375 g 12.5 mL/hr over 240 Minutes Intravenous Every 8 hours 01/02/12 2228 01/03/12 1550   01/02/12 2215   piperacillin-tazobactam (ZOSYN) IVPB 3.375 g  Status:  Discontinued        3.375 g 100 mL/hr over 30 Minutes Intravenous 3 times per day 01/02/12 2218 01/02/12 2226   01/02/12 2015  piperacillin-tazobactam (ZOSYN) IVPB 3.375 g       3.375 g 12.5 mL/hr over 240 Minutes Intravenous  Once 01/02/12 2004 01/02/12 2113   01/02/12 2015   vancomycin (VANCOCIN) IVPB 1000 mg/200 mL premix  Status:  Discontinued        1,000 mg 200 mL/hr over 60 Minutes Intravenous  Once 01/02/12 2004 01/02/12 2035          Assessment/Plan: s/p Procedure(s) (LRB) with comments: WOUND EXPLORATION () IRRIGATION AND DEBRIDEMENT WOUND () APPLICATION OF WOUND VAC () Continue  wound VAC. Plan is to reevaluate wounds at the bedside tomorrow. Should her wounds continued to demonstrate good healing then no additional chips the operating room will be anticipated. Continued decrease WBC count is encouraging. LOS: 6 days   Kara Gentry C 01/09/2012

## 2012-01-09 NOTE — Progress Notes (Signed)
Pt transferred to room 325 per MD order. Report called to RN. Pt transferred via wheelchair with personal belongings. Family members at bedside.

## 2012-01-09 NOTE — Progress Notes (Signed)
2 Days Post-Op Procedure(s) (LRB): WOUND EXPLORATION () IRRIGATION AND DEBRIDEMENT WOUND () APPLICATION OF WOUND VAC () PROGRESS NOTE AND CHANGE OF WOUNDVAC Subjective: Patient reports incisional pain, tolerating PO, + BM and no problems voiding.    Objective: I have reviewed patient's vital signs, intake and output, medications, labs and microbiology. WBC HAS INCREASED SLIGHTLY  TO 12k.  General: alert, cooperative and no distress Resp: clear to auscultation bilaterally GI: normal findings: bowel sounds normal, abnormal findings:  right flank is slightly indurated  without erythema, left side is moderately indurated, neither has erythema or increased tenderness, incision: serous and nonpurulent drainage present and WOUND VAC CHANGED.  A SMALL AMOUNT OF SEROUS DRAINAGE NOTED FROM UNDER THE MBF,BUT no purulence and each smaller site(4) is clean without purulence. Smaller mesh placed   Assessment: s/p Procedure(s) (LRB) with comments: WOUND EXPLORATION () IRRIGATION AND DEBRIDEMENT WOUND () APPLICATION OF WOUND VAC (): stable, progressing well and tolerating diet  Plan: Encourage ambulation transfer out of ICU/telemetry status, to regular med-surg Depending on WBC trend, may need one more trip to OR, and may be able to go home by Monday,perhaps earlier for home wound care.   We will likely continue IV abx at home, will consider change to p.o agents, depending on condition of wound on Friday  LOS: 7 days    Kara Gentry 01/09/2012, 12:28 PM

## 2012-01-09 NOTE — Clinical Social Work Psychosocial (Signed)
    Clinical Social Work Department BRIEF PSYCHOSOCIAL ASSESSMENT 01/09/2012  Patient:  Kara Gentry, Kara Gentry     Account Number:  [id number]     Admit date:  01/02/2012  Clinical Social Worker:  Santa Genera, CLINICAL SOCIAL WORKER  Date/Time:  01/09/2012 01:30 PM  Referred by:  CSW  Date Referred:  01/08/2012 Referred for  Other - See comment   Other Referral:   Death of baby, another baby in NICU   Interview type:  Patient Other interview type:   Family in room    PSYCHOSOCIAL DATA Living Status:  FAMILY Admitted from facility:   Level of care:   Primary support name:  Kara Gentry Primary support relationship to patient:  SPOUSE Degree of support available:   Significant    CURRENT CONCERNS Current Concerns  Other - See comment   Other Concerns:   Preterm delivery, one baby deceased, other baby in NICU at [facility]    SOCIAL WORK ASSESSMENT / PLAN CSW met w patient, she states she is quite sleepy due to pain meds and says she may fall asleep.  Patient recently delivered twins at 25 weeks.  One baby died at [facility], the other baby is being treated at [facility] NICU.  Patient received photos and mementoes of deceased child today.  Patient says she had brief opportunity to Skype w NICU, but connection was unreliable. Would like photos or another opportunity to Skype and have contact w baby.  Mother in room says she is able to visit daily, as have other relatives.  Patient's husband has been to hospital to see patient, but has not been to see baby as of yet.    CSW will contact CSW at [facility] and investigate possiblities for increasing contact between mother and baby and for providing psychosocial support to patient.   Assessment/plan status:  Psychosocial Support/Ongoing Assessment of Needs Other assessment/ plan:   Information/referral to community resources:   None at this time.    PATIENT'S/FAMILY'S RESPONSE TO PLAN OF CARE: Appreciative.      Santa Genera, LCSW Clinical Social Worker [phone])

## 2012-01-09 NOTE — Progress Notes (Signed)
2 Days Post-Op  Subjective: Pain control. No nausea. No fevers or chills.  Minimal drainage from wound VAC.  Objective: Vital signs in last 24 hours: Temp:  [98.6 F (37 Gentry)-99.9 F (37.7 Gentry)] 99.9 F (37.7 Gentry) (11/20 0800) Pulse Rate:  [66-99] 84  (11/20 1200) Resp:  [10-29] 22  (11/20 1200) BP: (101-119)/(64-78) 110/78 mmHg (11/20 1200) SpO2:  [94 %-100 %] 100 % (11/20 1200) Weight:  [74.5 kg (164 lb 3.9 oz)] 74.5 kg (164 lb 3.9 oz) (11/20 0456) Last BM Date: 01/08/12  Intake/Output from previous day: 11/19 0701 - 11/20 0700 In: 2438 [P.O.:900; I.V.:1238; IV Piggyback:300] Out: 1075 [Urine:900; Drains:175] Intake/Output this shift: Total I/O In: 300 [I.V.:250; IV Piggyback:50] Out: 650 [Urine:650]  General appearance: alert and no distress GI: Positive bowel sounds, soft, flat, back in place with negative pressure.  Lab Results:   [facility] 01/09/12 0446 01/08/12 0420  WBC 12.5* 11.4*  HGB 11.1* 11.0*  HCT 33.0* 32.7*  PLT 452* 470*   BMET  Basename 01/09/12 0446 01/08/12 0420  NA 142 142  K 3.1* 2.8*  CL 105 105  CO2 29 29  GLUCOSE 127* 97  BUN 9 12  CREATININE 0.76 0.80  CALCIUM 8.2* 8.0*   PT/INR No results found for this basename: LABPROT:2,INR:2 in the last 72 hours ABG No results found for this basename: PHART:2,PCO2:2,PO2:2,HCO3:2 in the last 72 hours  Studies/Results: No results found.  Anti-infectives: Anti-infectives     Start     Dose/Rate Route Frequency Ordered Stop   01/03/12 1800   piperacillin-tazobactam (ZOSYN) IVPB 3.375 g  Status:  Discontinued        3.375 g 100 mL/hr over 30 Minutes Intravenous 4 times per day 01/03/12 1550 01/03/12 1557   01/03/12 1800   piperacillin-tazobactam (ZOSYN) IVPB 3.375 g        3.375 g 12.5 mL/hr over 240 Minutes Intravenous Every 8 hours 01/03/12 1556     01/03/12 1600   clindamycin (CLEOCIN) IVPB 900 mg        900 mg 100 mL/hr over 30 Minutes Intravenous 3 times per day 01/03/12 1550     01/02/12  2230   piperacillin-tazobactam (ZOSYN) IVPB 3.375 g  Status:  Discontinued        3.375 g 12.5 mL/hr over 240 Minutes Intravenous Every 8 hours 01/02/12 2228 01/03/12 1550   01/02/12 2215   piperacillin-tazobactam (ZOSYN) IVPB 3.375 g  Status:  Discontinued        3.375 g 100 mL/hr over 30 Minutes Intravenous 3 times per day 01/02/12 2218 01/02/12 2226   01/02/12 2015   piperacillin-tazobactam (ZOSYN) IVPB 3.375 g        3.375 g 12.5 mL/hr over 240 Minutes Intravenous  Once 01/02/12 2004 01/02/12 2113   01/02/12 2015   vancomycin (VANCOCIN) IVPB 1000 mg/200 mL premix  Status:  Discontinued        1,000 mg 200 mL/hr over 60 Minutes Intravenous  Once 01/02/12 2004 01/02/12 2035          Assessment/Plan: s/p Procedure(s) (LRB) with comments: WOUND EXPLORATION () IRRIGATION AND DEBRIDEMENT WOUND () APPLICATION OF WOUND VAC () Necrotizing fasciitis. Leukocytosis has increased slightly but at time of VAC change or is no evidence of any necrotic tissue. The granulation tissue noted at all 5 wounds. MDF mesh was noted and was incorporating well. Skin edges are clean with no erythema. There is no fluctuance. Overall wounds appear to be progressing with no evidence of continued necrosis. Patient  tolerated the wound VAC change well. Back change was conducted with Dr. Emelda Fear. At this point no anticipate her return to the operating room unless patient's leukocytosis continues to increase. If her leukocytosis does increase her abdominal symptomatology worsens then we will plan to return to the operating room for an additional exploration. Next plan wound VAC change will be Friday.  LOS: 7 days    Kara Gentry 01/09/2012

## 2012-01-10 ENCOUNTER — Inpatient Hospital Stay (HOSPITAL_COMMUNITY): Payer: BC Managed Care – PPO

## 2012-01-10 LAB — BASIC METABOLIC PANEL
BUN: 6 mg/dL (ref 6–23)
CO2: 27 mEq/L (ref 19–32)
Calcium: 8 mg/dL — ABNORMAL LOW (ref 8.4–10.5)
Chloride: 108 mEq/L (ref 96–112)
Creatinine, Ser: 0.69 mg/dL (ref 0.50–1.10)
GFR calc Af Amer: >90 mL/min (ref >90)
GFR calc non Af Amer: >90 mL/min (ref >90)
Glucose, Bld: 104 mg/dL — ABNORMAL HIGH (ref 70–99)
Potassium: 3.2 mEq/L — ABNORMAL LOW (ref 3.5–5.1)
Sodium: 143 mEq/L (ref 135–145)

## 2012-01-10 LAB — CBC WITH DIFFERENTIAL/PLATELET
Basophils Absolute: 0 10*3/uL (ref 0.0–0.1)
Basophils Relative: 0 % (ref 0–1)
Eosinophils Absolute: 0.1 10*3/uL (ref 0.0–0.7)
Eosinophils Relative: 1 % (ref 0–5)
HCT: 33.3 % — ABNORMAL LOW (ref 36.0–46.0)
Hemoglobin: 11.1 g/dL — ABNORMAL LOW (ref 12.0–15.0)
Lymphocytes Relative: 8 % — ABNORMAL LOW (ref 12–46)
Lymphs Abs: 1.4 10*3/uL (ref 0.7–4.0)
MCH: 29.1 pg (ref 26.0–34.0)
MCHC: 33.3 g/dL (ref 30.0–36.0)
MCV: 87.2 fL (ref 78.0–100.0)
Monocytes Absolute: 0.7 10*3/uL (ref 0.1–1.0)
Monocytes Relative: 4 % (ref 3–12)
Neutro Abs: 15.4 10*3/uL — ABNORMAL HIGH (ref 1.7–7.7)
Neutrophils Relative %: 88 % — ABNORMAL HIGH (ref 43–77)
Platelets: 455 10*3/uL — ABNORMAL HIGH (ref 150–400)
RBC: 3.82 MIL/uL — ABNORMAL LOW (ref 3.87–5.11)
RDW: 15.3 % (ref 11.5–15.5)
WBC: 17.6 10*3/uL — ABNORMAL HIGH (ref 4.0–10.5)

## 2012-01-10 MED ORDER — CLINDAMYCIN PHOSPHATE 900 MG/50ML IV SOLN
INTRAVENOUS | Status: AC
  Filled 2012-01-10: qty 50

## 2012-01-10 MED ORDER — IOHEXOL 300 MG/ML  SOLN
100.0000 mL | Freq: Once | INTRAMUSCULAR | Status: AC | PRN
  Administered 2012-01-10: 100 mL via INTRAVENOUS

## 2012-01-10 MED ORDER — MAGNESIUM CITRATE PO SOLN
0.5000 | Freq: Once | ORAL | Status: AC
  Administered 2012-01-10: 0.5 via ORAL
  Filled 2012-01-10: qty 296

## 2012-01-10 NOTE — Clinical Social Work Note (Signed)
Pt requesting to Facetime with baby in NICU at [facility]. CSW coordinated with NICU CSW to [facility]. Attempt made, but unable to establish connection. Pictures were emailed to CSW with permission from pt and shared with her at bedside.   Derenda Fennel, [place] [phone]

## 2012-01-10 NOTE — Progress Notes (Signed)
Patient's CT has been reviewed. The results show a large interconnected fluid collection behind the bladder, in front of uterus, extending primarily into the right lower quadrant. This will require drainage and placement of a JP drain in the surgery tomorrow. Patient has been informed of the findings. Pt given Mg citrate this pm, and hydration begun.

## 2012-01-10 NOTE — Progress Notes (Signed)
3 Days Post-Op Procedure(s) (LRB): WOUND EXPLORATION () IRRIGATION AND DEBRIDEMENT WOUND () APPLICATION OF WOUND VAC ()3rd debridement  Subjective: Patient reports incisional pain, + flatus, + BM and no problems voiding.   We will try her on PO dilaudid today and stop PCA for a day  Objective: I have reviewed patient's vital signs, medications and labs.WBC has increased, and temp 101.7 yesterday CBC    Component Value Date/Time   WBC 17.6* 01/10/2012 0520   RBC 3.82* 01/10/2012 0520   HGB 11.1* 01/10/2012 0520   HCT 33.3* 01/10/2012 0520   PLT 455* 01/10/2012 0520   MCV 87.2 01/10/2012 0520   MCH 29.1 01/10/2012 0520   MCHC 33.3 01/10/2012 0520   RDW 15.3 01/10/2012 0520   LYMPHSABS 1.4 01/10/2012 0520   MONOABS 0.7 01/10/2012 0520   EOSABS 0.1 01/10/2012 0520   BASOSABS 0.0 01/10/2012 0520      General: alert, cooperative and no distress Resp: unlabored breath, clear, SaO2 normal GI: normal findings: bowel sounds normal, abnormal findings:  edema in lateral flanks remains stable, no identifiable tenderness or erythema and incision: clean and serous drainage present  Assessment: s/p Procedure(s) (LRB) with comments: WOUND EXPLORATION () IRRIGATION AND DEBRIDEMENT WOUND () APPLICATION OF WOUND VAC (): stable and post-op fever  Plan: CT abd/pelvis today, return to OR tomorrow, and replace wound vac, check for loculation.  LOS: 8 days    Mehreen Azizi V 01/10/2012, 8:31 AM

## 2012-01-10 NOTE — Progress Notes (Signed)
UR Chart Review Completed  

## 2012-01-11 ENCOUNTER — Encounter (HOSPITAL_COMMUNITY): Payer: Self-pay | Admitting: Anesthesiology

## 2012-01-11 ENCOUNTER — Inpatient Hospital Stay (HOSPITAL_COMMUNITY): Payer: BC Managed Care – PPO | Admitting: Anesthesiology

## 2012-01-11 ENCOUNTER — Encounter (HOSPITAL_COMMUNITY): Payer: Self-pay | Admitting: *Deleted

## 2012-01-11 ENCOUNTER — Encounter (HOSPITAL_COMMUNITY)
Admission: EM | Disposition: A | Discharge status: Home / Self Care | Payer: Self-pay | Source: Home / Self Care | Attending: Obstetrics and Gynecology

## 2012-01-11 HISTORY — PX: APPLICATION OF WOUND VAC: SHX5189

## 2012-01-11 HISTORY — PX: WOUND EXPLORATION: SHX6188

## 2012-01-11 LAB — CBC WITH DIFFERENTIAL/PLATELET
Basophils Absolute: 0 10*3/uL (ref 0.0–0.1)
Basophils Relative: 0 % (ref 0–1)
Eosinophils Absolute: 0.2 10*3/uL (ref 0.0–0.7)
Eosinophils Relative: 1 % (ref 0–5)
HCT: 31.3 % — ABNORMAL LOW (ref 36.0–46.0)
Hemoglobin: 10.4 g/dL — ABNORMAL LOW (ref 12.0–15.0)
Lymphocytes Relative: 11 % — ABNORMAL LOW (ref 12–46)
Lymphs Abs: 1.9 10*3/uL (ref 0.7–4.0)
MCH: 28.9 pg (ref 26.0–34.0)
MCHC: 33.2 g/dL (ref 30.0–36.0)
MCV: 86.9 fL (ref 78.0–100.0)
Monocytes Absolute: 0.7 10*3/uL (ref 0.1–1.0)
Monocytes Relative: 4 % (ref 3–12)
Neutro Abs: 14.8 10*3/uL — ABNORMAL HIGH (ref 1.7–7.7)
Neutrophils Relative %: 84 % — ABNORMAL HIGH (ref 43–77)
Platelets: 463 10*3/uL — ABNORMAL HIGH (ref 150–400)
RBC: 3.6 MIL/uL — ABNORMAL LOW (ref 3.87–5.11)
RDW: 15.2 % (ref 11.5–15.5)
WBC: 17.6 10*3/uL — ABNORMAL HIGH (ref 4.0–10.5)

## 2012-01-11 LAB — BASIC METABOLIC PANEL
BUN: 8 mg/dL (ref 6–23)
CO2: 25 mEq/L (ref 19–32)
Calcium: 8.4 mg/dL (ref 8.4–10.5)
Chloride: 107 mEq/L (ref 96–112)
Creatinine, Ser: 0.68 mg/dL (ref 0.50–1.10)
GFR calc Af Amer: >90 mL/min (ref >90)
GFR calc non Af Amer: >90 mL/min (ref >90)
Glucose, Bld: 112 mg/dL — ABNORMAL HIGH (ref 70–99)
Potassium: 3.4 mEq/L — ABNORMAL LOW (ref 3.5–5.1)
Sodium: 143 mEq/L (ref 135–145)

## 2012-01-11 LAB — TYPE AND SCREEN
ABO/RH(D): O POS
Antibody Screen: NEGATIVE

## 2012-01-11 SURGERY — WOUND EXPLORATION
Anesthesia: General | Site: Abdomen | Wound class: Dirty or Infected

## 2012-01-11 MED ORDER — SODIUM CHLORIDE 0.9 % IR SOLN
Status: DC | PRN
  Administered 2012-01-11: 3000 mL via INTRAVESICAL

## 2012-01-11 MED ORDER — ONDANSETRON HCL 4 MG/2ML IJ SOLN
4.0000 mg | Freq: Once | INTRAMUSCULAR | Status: DC | PRN
  Filled 2012-01-11: qty 2

## 2012-01-11 MED ORDER — SODIUM CHLORIDE 0.9 % IJ SOLN
INTRAMUSCULAR | Status: AC
  Filled 2012-01-11: qty 10

## 2012-01-11 MED ORDER — SCOPOLAMINE 1 MG/3DAYS TD PT72
1.0000 | MEDICATED_PATCH | Freq: Once | TRANSDERMAL | Status: DC
  Administered 2012-01-11: 1.5 mg via TRANSDERMAL

## 2012-01-11 MED ORDER — ACETAMINOPHEN 10 MG/ML IV SOLN
1000.0000 mg | Freq: Once | INTRAVENOUS | Status: AC
  Administered 2012-01-11: 1000 mg via INTRAVENOUS

## 2012-01-11 MED ORDER — PROPOFOL 10 MG/ML IV EMUL
INTRAVENOUS | Status: AC
  Filled 2012-01-11: qty 20

## 2012-01-11 MED ORDER — SUCCINYLCHOLINE CHLORIDE 20 MG/ML IJ SOLN
INTRAMUSCULAR | Status: DC | PRN
Start: 2012-01-11 — End: 2012-01-11
  Administered 2012-01-11: 12 mg via INTRAVENOUS

## 2012-01-11 MED ORDER — FENTANYL CITRATE 0.05 MG/ML IJ SOLN
INTRAMUSCULAR | Status: AC
  Filled 2012-01-11: qty 5

## 2012-01-11 MED ORDER — PROPOFOL 10 MG/ML IV BOLUS
INTRAVENOUS | Status: DC | PRN
  Administered 2012-01-11: 150 mg via INTRAVENOUS

## 2012-01-11 MED ORDER — MIDAZOLAM HCL 2 MG/2ML IJ SOLN
INTRAMUSCULAR | Status: AC
  Filled 2012-01-11: qty 2

## 2012-01-11 MED ORDER — MIDAZOLAM HCL 2 MG/2ML IJ SOLN
1.0000 mg | INTRAMUSCULAR | Status: DC | PRN
  Administered 2012-01-11: 2 mg via INTRAVENOUS

## 2012-01-11 MED ORDER — FENTANYL CITRATE 0.05 MG/ML IJ SOLN
INTRAMUSCULAR | Status: DC | PRN
  Administered 2012-01-11 (×5): 50 ug via INTRAVENOUS

## 2012-01-11 MED ORDER — ACETAMINOPHEN 10 MG/ML IV SOLN
INTRAVENOUS | Status: AC
  Filled 2012-01-11: qty 100

## 2012-01-11 MED ORDER — LIDOCAINE HCL (CARDIAC) 20 MG/ML IV SOLN
INTRAVENOUS | Status: DC | PRN
  Administered 2012-01-11: 30 mg via INTRAVENOUS

## 2012-01-11 MED ORDER — FENTANYL CITRATE 0.05 MG/ML IJ SOLN
25.0000 ug | INTRAMUSCULAR | Status: DC | PRN
  Administered 2012-01-11: 50 ug via INTRAVENOUS

## 2012-01-11 MED ORDER — SCOPOLAMINE 1 MG/3DAYS TD PT72
MEDICATED_PATCH | TRANSDERMAL | Status: AC
  Filled 2012-01-11: qty 1

## 2012-01-11 MED ORDER — LACTATED RINGERS IV SOLN
INTRAVENOUS | Status: DC
  Administered 2012-01-11: 1000 mL via INTRAVENOUS

## 2012-01-11 MED ORDER — FENTANYL CITRATE 0.05 MG/ML IJ SOLN
INTRAMUSCULAR | Status: AC
  Filled 2012-01-11: qty 2

## 2012-01-11 MED ORDER — ROCURONIUM BROMIDE 50 MG/5ML IV SOLN
INTRAVENOUS | Status: AC
  Filled 2012-01-11: qty 1

## 2012-01-11 MED ORDER — STERILE WATER FOR IRRIGATION IR SOLN
Status: DC | PRN
  Administered 2012-01-11: 1000 mL

## 2012-01-11 MED ORDER — SUCCINYLCHOLINE CHLORIDE 20 MG/ML IJ SOLN
INTRAMUSCULAR | Status: AC
  Filled 2012-01-11: qty 1

## 2012-01-11 MED ORDER — ROCURONIUM BROMIDE 100 MG/10ML IV SOLN
INTRAVENOUS | Status: DC | PRN
  Administered 2012-01-11: 5 mg via INTRAVENOUS
  Administered 2012-01-11: 10 mg via INTRAVENOUS

## 2012-01-11 MED ORDER — ONDANSETRON HCL 4 MG/2ML IJ SOLN
4.0000 mg | Freq: Once | INTRAMUSCULAR | Status: AC
  Administered 2012-01-11: 4 mg via INTRAVENOUS

## 2012-01-11 MED ORDER — LIDOCAINE HCL (PF) 1 % IJ SOLN
INTRAMUSCULAR | Status: AC
  Filled 2012-01-11: qty 5

## 2012-01-11 MED ORDER — ONDANSETRON HCL 4 MG/2ML IJ SOLN
INTRAMUSCULAR | Status: AC
  Filled 2012-01-11: qty 2

## 2012-01-11 MED ORDER — DEXAMETHASONE SODIUM PHOSPHATE 4 MG/ML IJ SOLN
INTRAMUSCULAR | Status: AC
  Filled 2012-01-11: qty 1

## 2012-01-11 MED ORDER — DEXAMETHASONE SODIUM PHOSPHATE 4 MG/ML IJ SOLN
4.0000 mg | Freq: Once | INTRAMUSCULAR | Status: AC
  Administered 2012-01-11: 4 mg via INTRAVENOUS

## 2012-01-11 SURGICAL SUPPLY — 40 items
BAG HAMPER (MISCELLANEOUS) ×2 IMPLANT
CANISTER WOUND CARE 500ML ATS (WOUND CARE) ×1 IMPLANT
CATH FOLEY 3WAY 30CC 20FR (CATHETERS) ×1 IMPLANT
CLOTH BEACON ORANGE TIMEOUT ST (SAFETY) ×2 IMPLANT
COVER LIGHT HANDLE STERIS (MISCELLANEOUS) ×4 IMPLANT
DRESSING COVERLET 3X1 FLEXIBLE (GAUZE/BANDAGES/DRESSINGS) ×1 IMPLANT
DRSG VAC ATS MED SENSATRAC (GAUZE/BANDAGES/DRESSINGS) ×1 IMPLANT
ELECT REM PT RETURN 9FT ADLT (ELECTROSURGICAL) ×2
ELECTRODE REM PT RTRN 9FT ADLT (ELECTROSURGICAL) ×1 IMPLANT
EVACUATOR DRAINAGE 10X20 100CC (DRAIN) ×1 IMPLANT
EVACUATOR SILICONE 100CC (DRAIN) ×2
GAUZE PACKING 2X5 YD STERILE (GAUZE/BANDAGES/DRESSINGS) ×1 IMPLANT
GLOVE BIOGEL PI IND STRL 7.5 (GLOVE) IMPLANT
GLOVE BIOGEL PI INDICATOR 7.5 (GLOVE) ×2
GLOVE ECLIPSE 7.0 STRL STRAW (GLOVE) ×2 IMPLANT
GLOVE ECLIPSE 9.0 STRL (GLOVE) ×2 IMPLANT
GLOVE EXAM NITRILE MD LF STRL (GLOVE) ×1 IMPLANT
GLOVE INDICATOR STER SZ 9 (GLOVE) ×2 IMPLANT
GOWN STRL REIN 3XL LVL4 (GOWN DISPOSABLE) ×2 IMPLANT
GOWN STRL REIN XL XLG (GOWN DISPOSABLE) ×3 IMPLANT
GRAFT FLEX HD 3X7 (Tissue Mesh) ×1 IMPLANT
INST SET MAJOR GENERAL (KITS) ×2 IMPLANT
KIT ROOM TURNOVER APOR (KITS) ×2 IMPLANT
MANIFOLD NEPTUNE II (INSTRUMENTS) ×2 IMPLANT
NS IRRIG 1000ML POUR BTL (IV SOLUTION) ×2 IMPLANT
PACK ABDOMINAL MAJOR (CUSTOM PROCEDURE TRAY) ×2 IMPLANT
PAD ABD 5X9 TENDERSORB (GAUZE/BANDAGES/DRESSINGS) ×2 IMPLANT
PAD ARMBOARD 7.5X6 YLW CONV (MISCELLANEOUS) ×2 IMPLANT
PLUG CATH AND CAP STER (CATHETERS) ×1 IMPLANT
SET BASIN LINEN APH (SET/KITS/TRAYS/PACK) ×2 IMPLANT
SOL PREP PROV IODINE SCRUB 4OZ (MISCELLANEOUS) ×2 IMPLANT
SPONGE DRAIN TRACH 4X4 STRL 2S (GAUZE/BANDAGES/DRESSINGS) ×1 IMPLANT
SPONGE GAUZE 4X4 12PLY (GAUZE/BANDAGES/DRESSINGS) ×2 IMPLANT
SUT ETHILON 3 0 FSL (SUTURE) ×1 IMPLANT
SUT PROLENE 0 CT 1 30 (SUTURE) ×2 IMPLANT
SUT PROLENE 2 0 SH 30 (SUTURE) ×2 IMPLANT
SWAB COLLECTION DEVICE MRSA (MISCELLANEOUS) ×1 IMPLANT
SYR 30ML LL (SYRINGE) ×2 IMPLANT
TOWEL OR 17X26 4PK STRL BLUE (TOWEL DISPOSABLE) ×2 IMPLANT
TUBE ANAEROBIC PORT A CUL  W/M (MISCELLANEOUS) ×1 IMPLANT

## 2012-01-11 NOTE — Anesthesia Procedure Notes (Signed)
Procedure Name: Intubation Date/Time: 01/11/2012 9:35 AM Performed by: Glynn Octave E Pre-anesthesia Checklist: Patient identified, Patient being monitored, Timeout performed, Emergency Drugs available and Suction available Patient Re-evaluated:Patient Re-evaluated prior to inductionOxygen Delivery Method: Circle System Utilized Preoxygenation: Pre-oxygenation with 100% oxygen Intubation Type: IV induction, Rapid sequence and Cricoid Pressure applied Ventilation: Mask ventilation without difficulty Laryngoscope Size: Mac and 3 Grade View: Grade I Tube type: Oral Tube size: 7.0 mm Number of attempts: 1 Airway Equipment and Method: stylet Placement Confirmation: ETT inserted through vocal cords under direct vision,  positive ETCO2 and breath sounds checked- equal and bilateral Secured at: 21 cm Tube secured with: Tape Dental Injury: Teeth and Oropharynx as per pre-operative assessment

## 2012-01-11 NOTE — Anesthesia Preprocedure Evaluation (Signed)
Anesthesia Evaluation  Patient identified by MRN, date of birth, ID band Patient awake    Reviewed: Allergy & Precautions, H&P , NPO status , Patient's Chart, lab work & pertinent test results  History of Anesthesia Complications (+) PONV  Airway Mallampati: II  Neck ROM: Full    Dental  (+) Teeth Intact   Pulmonary  breath sounds clear to auscultation        Cardiovascular Rhythm:Regular     Neuro/Psych    GI/Hepatic GERD-  Controlled,  Endo/Other    Renal/GU      Musculoskeletal   Abdominal   Peds  Hematology   Anesthesia Other Findings   Reproductive/Obstetrics Csection [redacted]weeks gestation; transverse lie                           Anesthesia Physical Anesthesia Plan  ASA: III  Anesthesia Plan: General   Post-op Pain Management:    Induction: Intravenous, Rapid sequence and Cricoid pressure planned  Airway Management Planned: Oral ETT  Additional Equipment:   Intra-op Plan:   Post-operative Plan: Extubation in OR  Informed Consent: I have reviewed the patients History and Physical, chart, labs and discussed the procedure including the risks, benefits and alternatives for the proposed anesthesia with the patient or authorized representative who has indicated his/her understanding and acceptance.   Dental advisory given  Plan Discussed with: Anesthesiologist and Surgeon  Anesthesia Plan Comments:         Anesthesia Quick Evaluation

## 2012-01-11 NOTE — Op Note (Signed)
Patient:  Kara Gentry  DOB:  1975-03-30  MRN:  [id number]   Preop Diagnosis:  Necrotizing fasciitis, persistent leukocytosis  Postop Diagnosis:  The same, intraperitoneal abscess  Procedure:  Wound exploration, fascial closure with a 3 x 7 cm MDF biologic porcine mesh, wound VAC closure  Surgeon:  Dr. Christin Bach and Dr. Tilford Pillar  Anes:  General endotracheal  Indications:  Patient is a 36 year old female with a history of emergent cesarean section and development of a uterine abscess and necrotizing fasciitis. She has undergone several reexplorations to confirm viable fascia. Her last back dressing change was performed on the floor and the wounds were noted to be healing well with good granulation tissue. However following the fact change her leukocytosis again increased.  Plans to return to the operating room were discussed and patient was consented for planned reexploration  Procedure note:  Please see Dr. Emelda Fear operative report for the complete procedure note. The wound VAC was taken down. Betadine solution was utilized to prep the abdominal wall, wounds and perineum. Three-way Foley catheter had been inserted to evaluate for possible bladder injury. The soft tissue appeared viable at all 5 abdominal wound sites with good granulation tissue being demonstrated. The previously placed M.D. F. mesh was demonstrating evidence of breakdown due to the chronic inflammation in this area. We opted to remove this. On exploration of the abdominal wall there was one small area of soft tissue purulence encountered which was quickly debrided with a healthy viable subcuticular tissue noted around the area. Returning to inspection of the pelvic cavity we encountered a large collection of purulence. This tract to the right flank. Both aerobic and anaerobic cultures were obtained. There is no evidence of any bladder leak with irrigation. The abscess cavity was irrigated. A 10 flat JP drain was placed  into the abscess cavity 3 separate stab incision in the right flank. Overall no additional necrotic fascia was encountered. At this time I turned her attention to closure.  The drain was secured to the skin with a 3-0 nylon. The 3 x 7 cm MDF mesh was secured to the fascial defect. A medium lap sponge for the wound VAC was trimmed and inserted into all 5 wounds. The back was applied in standard fashion. Good negative pressure was obtained with the back. Her quite pleased with the appearance of the abdominal wall. At this time patient was allowed to come out of general anesthetic and stretcher to the PACU in stable condition. All instrument sponge and needle counts are correct.  At this time plans are to change the patient's wound VAC Monday on the floor.  Complications:  None apparent  EBL:  Less than 100  Specimen:  Cultures

## 2012-01-11 NOTE — Brief Op Note (Signed)
01/02/2012 - 01/11/2012  10:58 AM  PATIENT:  Kara Gentry  36 y.o. female  PRE-OPERATIVE DIAGNOSIS:  wound abscess, status post cesarean  POST-OPERATIVE DIAGNOSIS:  wound abscess, status post cesarean, necrotizing fasciatis  PROCEDURE:  Procedure(s) (LRB) with comments: WOUND EXPLORATION (N/A) - Debridment of Wound Abscess APPLICATION OF WOUND VAC (N/A) - Fasicial Closure  SURGEON:  Surgeon(s) and Role:    * Tilda Burrow, MD - assisting   * Fabio Bering, MD - primary PHYSICIAN ASSISTANT:   ASSISTANTS: none   ANESTHESIA:   general  EBL:  Total I/O In: 750 [I.V.:750] Out: 250 [Urine:250]  BLOOD ADMINISTERED:none  DRAINS: (1) Jackson-Pratt drain(s) with closed bulb suction in the vesicouterine space, exiting in RLQ   LOCAL MEDICATIONS USED:  NONE  SPECIMEN:  No Specimen  DISPOSITION OF SPECIMEN:  culture to lab  COUNTS:  YES  TOURNIQUET:  * No tourniquets in log *  DICTATION: .Dragon Dictation by Dr Girtha Rm  PLAN OF CARE: Admit to inpatient   PATIENT DISPOSITION:  PACU - hemodynamically stable.   Delay start of Pharmacological VTE agent (>24hrs) due to surgical blood loss or risk of bleeding: not applicable

## 2012-01-11 NOTE — Plan of Care (Signed)
Problem: Phase II Progression Outcomes Goal: Progress activity as tolerated unless otherwise ordered Outcome: Progressing Pt has gotten OOB with assistance to the bathroom.

## 2012-01-11 NOTE — Transfer of Care (Signed)
Immediate Anesthesia Transfer of Care Note  Patient: Kara Gentry  Procedure(s) Performed: Procedure(s) (LRB) with comments: WOUND EXPLORATION (N/A) - Debridment of Wound Abscess APPLICATION OF WOUND VAC (N/A) - Fasicial Closure  Patient Location: PACU  Anesthesia Type:General  Level of Consciousness: sedated  Airway & Oxygen Therapy: Patient Spontanous Breathing and Patient connected to face mask oxygen  Post-op Assessment: Report given to PACU RN and Post -op Vital signs reviewed and stable  Post vital signs: Reviewed and stable  Complications: No apparent anesthesia complications

## 2012-01-11 NOTE — Anesthesia Postprocedure Evaluation (Signed)
  Anesthesia Post-op Note  Patient: Kara Gentry  Procedure(s) Performed: Procedure(s) (LRB) with comments: WOUND EXPLORATION (N/A) - Debridment of Wound Abscess APPLICATION OF WOUND VAC (N/A) - Fasicial Closure  Patient Location: PACU  Anesthesia Type:General  Level of Consciousness: sedated  Airway and Oxygen Therapy: Patient Spontanous Breathing and Patient connected to face mask oxygen  Post-op Pain: mild  Post-op Assessment: Post-op Vital signs reviewed, Patient's Cardiovascular Status Stable, Respiratory Function Stable, Patent Airway and No signs of Nausea or vomiting  Post-op Vital Signs: Reviewed and stable  Complications: No apparent anesthesia complications

## 2012-01-12 LAB — CBC WITH DIFFERENTIAL/PLATELET
Basophils Absolute: 0 10*3/uL (ref 0.0–0.1)
Basophils Relative: 0 % (ref 0–1)
Eosinophils Absolute: 0.2 10*3/uL (ref 0.0–0.7)
Eosinophils Relative: 1 % (ref 0–5)
HCT: 30.7 % — ABNORMAL LOW (ref 36.0–46.0)
Hemoglobin: 10.3 g/dL — ABNORMAL LOW (ref 12.0–15.0)
Lymphocytes Relative: 19 % (ref 12–46)
Lymphs Abs: 2.6 10*3/uL (ref 0.7–4.0)
MCH: 29.3 pg (ref 26.0–34.0)
MCHC: 33.6 g/dL (ref 30.0–36.0)
MCV: 87.5 fL (ref 78.0–100.0)
Monocytes Absolute: 0.6 10*3/uL (ref 0.1–1.0)
Monocytes Relative: 5 % (ref 3–12)
Neutro Abs: 10.3 10*3/uL — ABNORMAL HIGH (ref 1.7–7.7)
Neutrophils Relative %: 75 % (ref 43–77)
Platelets: 472 10*3/uL — ABNORMAL HIGH (ref 150–400)
RBC: 3.51 MIL/uL — ABNORMAL LOW (ref 3.87–5.11)
RDW: 15.3 % (ref 11.5–15.5)
WBC: 13.7 10*3/uL — ABNORMAL HIGH (ref 4.0–10.5)

## 2012-01-12 LAB — BASIC METABOLIC PANEL
BUN: 10 mg/dL (ref 6–23)
CO2: 26 mEq/L (ref 19–32)
Calcium: 8.5 mg/dL (ref 8.4–10.5)
Chloride: 107 mEq/L (ref 96–112)
Creatinine, Ser: 0.7 mg/dL (ref 0.50–1.10)
GFR calc Af Amer: >90 mL/min (ref >90)
GFR calc non Af Amer: >90 mL/min (ref >90)
Glucose, Bld: 93 mg/dL (ref 70–99)
Potassium: 3.4 mEq/L — ABNORMAL LOW (ref 3.5–5.1)
Sodium: 142 mEq/L (ref 135–145)

## 2012-01-13 LAB — CBC WITH DIFFERENTIAL/PLATELET
Basophils Absolute: 0 10*3/uL (ref 0.0–0.1)
Basophils Relative: 1 % (ref 0–1)
Eosinophils Absolute: 0.2 10*3/uL (ref 0.0–0.7)
Eosinophils Relative: 2 % (ref 0–5)
HCT: 31.8 % — ABNORMAL LOW (ref 36.0–46.0)
Hemoglobin: 10.5 g/dL — ABNORMAL LOW (ref 12.0–15.0)
Lymphocytes Relative: 29 % (ref 12–46)
Lymphs Abs: 2.4 10*3/uL (ref 0.7–4.0)
MCH: 29.1 pg (ref 26.0–34.0)
MCHC: 33 g/dL (ref 30.0–36.0)
MCV: 88.1 fL (ref 78.0–100.0)
Monocytes Absolute: 0.5 10*3/uL (ref 0.1–1.0)
Monocytes Relative: 6 % (ref 3–12)
Neutro Abs: 5.1 10*3/uL (ref 1.7–7.7)
Neutrophils Relative %: 62 % (ref 43–77)
Platelets: 508 10*3/uL — ABNORMAL HIGH (ref 150–400)
RBC: 3.61 MIL/uL — ABNORMAL LOW (ref 3.87–5.11)
RDW: 15.4 % (ref 11.5–15.5)
WBC: 8.2 10*3/uL (ref 4.0–10.5)

## 2012-01-13 LAB — BASIC METABOLIC PANEL
BUN: 11 mg/dL (ref 6–23)
CO2: 26 mEq/L (ref 19–32)
Calcium: 8.7 mg/dL (ref 8.4–10.5)
Chloride: 108 mEq/L (ref 96–112)
Creatinine, Ser: 0.72 mg/dL (ref 0.50–1.10)
GFR calc Af Amer: >90 mL/min (ref >90)
GFR calc non Af Amer: >90 mL/min (ref >90)
Glucose, Bld: 94 mg/dL (ref 70–99)
Potassium: 3.2 mEq/L — ABNORMAL LOW (ref 3.5–5.1)
Sodium: 144 mEq/L (ref 135–145)

## 2012-01-13 MED ORDER — PIPERACILLIN-TAZOBACTAM 3.375 G IVPB
INTRAVENOUS | Status: AC
  Filled 2012-01-13: qty 100

## 2012-01-13 MED ORDER — HYDROCODONE-ACETAMINOPHEN 5-325 MG PO TABS
1.0000 | ORAL_TABLET | ORAL | Status: DC | PRN
  Administered 2012-01-13: 1 via ORAL
  Filled 2012-01-13: qty 1

## 2012-01-13 NOTE — Progress Notes (Signed)
POD #1  Subjective: Pain improving.  No fever or chills.  Tolerating PO.  Objective: Vital signs in last 24 hours: Temp:  [97 F (36.1 C)-98.7 F (37.1 C)] 97 F (36.1 C) (11/24 2135) Pulse Rate:  [50-75] 60  (11/24 2135) Resp:  [17-21] 20  (11/24 2135) BP: (109-128)/(70-78) 128/70 mmHg (11/24 2135) SpO2:  [98 %-100 %] 99 % (11/24 2135) Last BM Date: 01/11/12  Intake/Output from previous day: 11/23 0701 - 11/24 0700 In: 850 [P.O.:720; IV Piggyback:100] Out: 185 [Drains:185] Intake/Output this shift:    General appearance: alert and no distress GI: soft, moderate expected tenderness.  VAC in place.    Lab Results:   [facility] 01/13/12 0623 01/12/12 0635  WBC 8.2 13.7*  HGB 10.5* 10.3*  HCT 31.8* 30.7*  PLT 508* 472*   BMET  Basename 01/13/12 0623 01/12/12 0635  NA 144 142  K 3.2* 3.4*  CL 108 107  CO2 26 26  GLUCOSE 94 93  BUN 11 10  CREATININE 0.72 0.70  CALCIUM 8.7 8.5   PT/INR No results found for this basename: LABPROT:2,INR:2 in the last 72 hours ABG No results found for this basename: PHART:2,PCO2:2,PO2:2,HCO3:2 in the last 72 hours  Studies/Results: No results found.  Anti-infectives: Anti-infectives     Start     Dose/Rate Route Frequency Ordered Stop   01/03/12 1800   piperacillin-tazobactam (ZOSYN) IVPB 3.375 g  Status:  Discontinued        3.375 g 100 mL/hr over 30 Minutes Intravenous 4 times per day 01/03/12 1550 01/03/12 1557   01/03/12 1800   piperacillin-tazobactam (ZOSYN) IVPB 3.375 g        3.375 g 12.5 mL/hr over 240 Minutes Intravenous Every 8 hours 01/03/12 1556     01/03/12 1600   clindamycin (CLEOCIN) IVPB 900 mg        900 mg 100 mL/hr over 30 Minutes Intravenous 3 times per day 01/03/12 1550     01/02/12 2230   piperacillin-tazobactam (ZOSYN) IVPB 3.375 g  Status:  Discontinued        3.375 g 12.5 mL/hr over 240 Minutes Intravenous Every 8 hours 01/02/12 2228 01/03/12 1550   01/02/12 2215   piperacillin-tazobactam (ZOSYN)  IVPB 3.375 g  Status:  Discontinued        3.375 g 100 mL/hr over 30 Minutes Intravenous 3 times per day 01/02/12 2218 01/02/12 2226   01/02/12 2015   piperacillin-tazobactam (ZOSYN) IVPB 3.375 g        3.375 g 12.5 mL/hr over 240 Minutes Intravenous  Once 01/02/12 2004 01/02/12 2113   01/02/12 2015   vancomycin (VANCOCIN) IVPB 1000 mg/200 mL premix  Status:  Discontinued        1,000 mg 200 mL/hr over 60 Minutes Intravenous  Once 01/02/12 2004 01/02/12 2035          Assessment/Plan: s/p Procedure(s) (LRB) with comments: WOUND EXPLORATION (N/A) - Debridment of Wound Abscess APPLICATION OF WOUND VAC (N/A) - Fasicial Closure Doing well.  Continue IV abx.  Continue VAC.  Plan change of the VAC on monday.   LOS 10 days   Lucilia Yanni C 01/13/2012

## 2012-01-13 NOTE — Anesthesia Postprocedure Evaluation (Signed)
  Anesthesia Post-op Note  Patient: Kara Gentry  Procedure(s) Performed: Procedure(s) (LRB) with comments: WOUND EXPLORATION (N/A) - Debridment of Wound Abscess APPLICATION OF WOUND VAC (N/A) - Fasicial Closure  Patient Location: room 325  Anesthesia Type:General  Level of Consciousness: awake, alert , oriented and patient cooperative  Airway and Oxygen Therapy: Patient Spontanous Breathing  Post-op Pain: 3 /10, mild  Post-op Assessment: Post-op Vital signs reviewed, Patient's Cardiovascular Status Stable, Respiratory Function Stable, Patent Airway, No signs of Nausea or vomiting, Adequate PO intake and Pain level controlled  Post-op Vital Signs: Reviewed and stable  Complications: No apparent anesthesia complications

## 2012-01-13 NOTE — Progress Notes (Signed)
2 Days Post-Op  Subjective: Pain controlled well.  Feels good.    Objective: Vital signs in last 24 hours: Temp:  [97 F (36.1 C)-98.7 F (37.1 C)] 97 F (36.1 C) (11/24 2135) Pulse Rate:  [50-75] 60  (11/24 2135) Resp:  [17-21] 20  (11/24 2135) BP: (109-128)/(70-78) 128/70 mmHg (11/24 2135) SpO2:  [98 %-100 %] 99 % (11/24 2135) Last BM Date: 01/11/12  Intake/Output from previous day: 11/23 0701 - 11/24 0700 In: 850 [P.O.:720; IV Piggyback:100] Out: 185 [Drains:185] Intake/Output this shift:    General appearance: alert and no distress GI: Soft, expected tenderness.  No peritoneal signs.  JP sero sang.  VAC in place.    Lab Results:   [facility] 01/13/12 0623 01/12/12 0635  WBC 8.2 13.7*  HGB 10.5* 10.3*  HCT 31.8* 30.7*  PLT 508* 472*   BMET  Basename 01/13/12 0623 01/12/12 0635  NA 144 142  K 3.2* 3.4*  CL 108 107  CO2 26 26  GLUCOSE 94 93  BUN 11 10  CREATININE 0.72 0.70  CALCIUM 8.7 8.5   PT/INR No results found for this basename: LABPROT:2,INR:2 in the last 72 hours ABG No results found for this basename: PHART:2,PCO2:2,PO2:2,HCO3:2 in the last 72 hours  Studies/Results: No results found.  Anti-infectives: Anti-infectives     Start     Dose/Rate Route Frequency Ordered Stop   01/03/12 1800   piperacillin-tazobactam (ZOSYN) IVPB 3.375 g  Status:  Discontinued        3.375 g 100 mL/hr over 30 Minutes Intravenous 4 times per day 01/03/12 1550 01/03/12 1557   01/03/12 1800   piperacillin-tazobactam (ZOSYN) IVPB 3.375 g        3.375 g 12.5 mL/hr over 240 Minutes Intravenous Every 8 hours 01/03/12 1556     01/03/12 1600   clindamycin (CLEOCIN) IVPB 900 mg        900 mg 100 mL/hr over 30 Minutes Intravenous 3 times per day 01/03/12 1550     01/02/12 2230   piperacillin-tazobactam (ZOSYN) IVPB 3.375 g  Status:  Discontinued        3.375 g 12.5 mL/hr over 240 Minutes Intravenous Every 8 hours 01/02/12 2228 01/03/12 1550   01/02/12 2215    piperacillin-tazobactam (ZOSYN) IVPB 3.375 g  Status:  Discontinued        3.375 g 100 mL/hr over 30 Minutes Intravenous 3 times per day 01/02/12 2218 01/02/12 2226   01/02/12 2015   piperacillin-tazobactam (ZOSYN) IVPB 3.375 g        3.375 g 12.5 mL/hr over 240 Minutes Intravenous  Once 01/02/12 2004 01/02/12 2113   01/02/12 2015   vancomycin (VANCOCIN) IVPB 1000 mg/200 mL premix  Status:  Discontinued        1,000 mg 200 mL/hr over 60 Minutes Intravenous  Once 01/02/12 2004 01/02/12 2035          Assessment/Plan: s/p Procedure(s) (LRB) with comments: WOUND EXPLORATION (N/A) - Debridment of Wound Abscess APPLICATION OF WOUND VAC (N/A) - Fasicial Closure Overall doing very well.  WBC count has normalized.  Continue ABX for now.  Will change VAC in AM.  OVerall doing well.  LOS: 11 days    Meloney Feld C 01/13/2012

## 2012-01-13 NOTE — Addendum Note (Signed)
Addendum  created 01/13/12 0745 by Despina Hidden, CRNA   Modules edited:Notes Section

## 2012-01-14 LAB — CBC WITH DIFFERENTIAL/PLATELET
Basophils Absolute: 0 10*3/uL (ref 0.0–0.1)
Basophils Relative: 0 % (ref 0–1)
Eosinophils Absolute: 0.2 10*3/uL (ref 0.0–0.7)
Eosinophils Relative: 3 % (ref 0–5)
HCT: 33.2 % — ABNORMAL LOW (ref 36.0–46.0)
Hemoglobin: 10.9 g/dL — ABNORMAL LOW (ref 12.0–15.0)
Lymphocytes Relative: 30 % (ref 12–46)
Lymphs Abs: 2.1 10*3/uL (ref 0.7–4.0)
MCH: 29.1 pg (ref 26.0–34.0)
MCHC: 32.8 g/dL (ref 30.0–36.0)
MCV: 88.8 fL (ref 78.0–100.0)
Monocytes Absolute: 0.4 10*3/uL (ref 0.1–1.0)
Monocytes Relative: 5 % (ref 3–12)
Neutro Abs: 4.3 10*3/uL (ref 1.7–7.7)
Neutrophils Relative %: 62 % (ref 43–77)
Platelets: 552 10*3/uL — ABNORMAL HIGH (ref 150–400)
RBC: 3.74 MIL/uL — ABNORMAL LOW (ref 3.87–5.11)
RDW: 15.3 % (ref 11.5–15.5)
WBC: 6.9 10*3/uL (ref 4.0–10.5)

## 2012-01-14 LAB — WOUND CULTURE: Culture: NO GROWTH

## 2012-01-14 LAB — BASIC METABOLIC PANEL
BUN: 10 mg/dL (ref 6–23)
CO2: 26 mEq/L (ref 19–32)
Calcium: 8.9 mg/dL (ref 8.4–10.5)
Chloride: 108 mEq/L (ref 96–112)
Creatinine, Ser: 0.66 mg/dL (ref 0.50–1.10)
GFR calc Af Amer: >90 mL/min (ref >90)
GFR calc non Af Amer: >90 mL/min (ref >90)
Glucose, Bld: 99 mg/dL (ref 70–99)
Potassium: 3.7 mEq/L (ref 3.5–5.1)
Sodium: 143 mEq/L (ref 135–145)

## 2012-01-14 MED ORDER — HYDROCODONE-ACETAMINOPHEN 5-325 MG PO TABS: 1.0000 | ORAL_TABLET | ORAL | Status: DC | PRN

## 2012-01-14 MED ORDER — HEPARIN SOD (PORK) LOCK FLUSH 100 UNIT/ML IV SOLN
500.0000 [IU] | Freq: Once | INTRAVENOUS | Status: AC
  Administered 2012-01-14: 500 [IU] via INTRAVENOUS
  Filled 2012-01-14 (×2): qty 5

## 2012-01-14 MED ORDER — BOOST / RESOURCE BREEZE PO LIQD: 1.0000 | Freq: Three times a day (TID) | ORAL | Status: DC

## 2012-01-14 MED ORDER — DOCUSATE SODIUM 100 MG PO CAPS: 100.0000 mg | ORAL_CAPSULE | Freq: Two times a day (BID) | ORAL | Status: DC | PRN

## 2012-01-14 MED ORDER — HYDROMORPHONE HCL PF 1 MG/ML IJ SOLN
INTRAMUSCULAR | Status: AC
  Administered 2012-01-14: 1 mg
  Filled 2012-01-14: qty 1

## 2012-01-14 MED ORDER — SODIUM CHLORIDE 0.9 % IV SOLN
1.0000 g | Freq: Once | INTRAVENOUS | Status: AC
  Administered 2012-01-14: 1 g via INTRAVENOUS
  Filled 2012-01-14: qty 1

## 2012-01-14 MED ORDER — HYDROMORPHONE HCL PF 1 MG/ML IJ SOLN: 1.0000 mg | INTRAMUSCULAR | Status: DC | PRN

## 2012-01-14 NOTE — Progress Notes (Signed)
Nutrition Follow-up  Intervention:  Recommend pt continue with supplements daily: Prostat 30 ml TID and ES Complete BID between meals.  Assessment:   Pt says her appetite is slowly improving. She has been taking her Protein supplement and prefers Ensure to the [employer]. She is being d/c home today and we discussed ways to meet her increased protein and energy needs while she is healing.  Diet Order: Regular 0-25% meals  Meds: Scheduled Meds:   . ertapenem (INVANZ) IV  1 g Intravenous Once  . feeding supplement  30 mL Oral TID WC  . feeding supplement  1 Container Oral TID BM  . heparin lock flush  500 Units Intravenous Once  . [COMPLETED] HYDROmorphone      . potassium chloride  20 mEq Oral BID  . sodium chloride  10-40 mL Intracatheter Q12H  . [DISCONTINUED] clindamycin (CLEOCIN) IV  900 mg Intravenous Q8H  . [DISCONTINUED] HYDROmorphone PCA 0.3 mg/mL   Intravenous Q4H  . [DISCONTINUED] piperacillin-tazobactam (ZOSYN)  IV  3.375 g Intravenous Q8H  . [COMPLETED] scopolamine  1 patch Transdermal Once   Continuous Infusions:  PRN Meds:.acetaminophen, HYDROcodone-acetaminophen, HYDROmorphone (DILAUDID) injection, HYDROmorphone, menthol-cetylpyridinium, sodium chloride, [DISCONTINUED] diphenhydrAMINE, [DISCONTINUED] diphenhydrAMINE, [DISCONTINUED] naloxone, [DISCONTINUED] ondansetron (ZOFRAN) IV, [DISCONTINUED] sodium chloride   CMP     Component Value Date/Time   NA 143 01/14/2012 0530   K 3.7 01/14/2012 0530   CL 108 01/14/2012 0530   CO2 26 01/14/2012 0530   GLUCOSE 99 01/14/2012 0530   BUN 10 01/14/2012 0530   CREATININE 0.66 01/14/2012 0530   CALCIUM 8.9 01/14/2012 0530   PROT 5.4* 01/04/2012 0517   ALBUMIN 1.3* 01/04/2012 0517   AST 13 01/04/2012 0517   ALT 17 01/04/2012 0517   ALKPHOS 96 01/04/2012 0517   BILITOT 0.9 01/04/2012 0517   GFRNONAA >90 01/14/2012 0530   GFRAA >90 01/14/2012 0530    CBG (last 3)  No results found for this basename: GLUCAP:3 in  the last 72 hours   Intake/Output Summary (Last 24 hours) at 01/14/12 1538 Last data filed at 01/14/12 0549  Gross per 24 hour  Intake    750 ml  Output    120 ml  Net    630 ml    Weight Status: 164# (74.5 kg) reflects 4%,3kg wt loss x 7 days which is significant  Re-estimated needs: 1900-2200 kcal, 90-105 gr, 1ml /kcal/day  Inadequate oral intake related to poor appetite as evidenced by meal intakes 0-25% meal.     #[phone]

## 2012-01-14 NOTE — Discharge Summary (Signed)
Physician Discharge Summary  Patient ID: Kara Gentry MRN: [id number] DOB/AGE: 10-23-1975 36 y.o.  Admit date: 01/02/2012 Discharge date: 01/14/2012  Admission Diagnoses:Wound abscess, postoperative s/p cesarean  Discharge Diagnoses:  Principal Problem:  *Postoperative wound abscess Active Problems:  Necrotizing fasciitis   Discharged Condition: good  Hospital Course: Kara Gentry is an 36 y.o. female.  She is 11days s/p cesarean section for extreme premature twins, due to PPROM, and fetal malpresentation, who presented tonight to [facility] with drainage of bloody foul malodorous fluid from incision. No nausea or vomiting.Wound opened in ED sufficient to drain over 100 cc of bloody purulent fluid, and place a couple of 4/4's to ease egress of purulence. CT to be ordered to assess pelvis, and labs notable for anemia, Hgb 8, with plan to transfuse 2 units prbc in am prior to OR exploration of wound.    Kara Gentry had several trips to the OR for wound debridement and abscess evacuation. Admission was 01/02/2012, first turned to the OR 01/03/2012 where the large abscess in the vesicouterine reflection of the anterior abdominal cavity was  emptied and evacuated and fibrinous debris removed, and Jackson-Pratt drain placed in this area and allowed to exit through the right lower quadrant surgical consult was obtained by Dr. Leticia Penna, and MBF  replacement of the necrotic fascia sewn in place and the fact placed in the incision and 2 separate wounds cut on the anterior right lower quadrant and the abdominal wall. Upon return to the OR 11/16 2 additional separate wounds were made on the right lower quadrant to access purulent material that had extravasated into the subcutaneous fat on the right side. On trip 3 to the OR 01/07/2012 significant improvement was apparent, and the subcutaneous drain removed on 01/09/2012 the wound VAC was replaced on the hospital surgical Department without going to the  operating room. On 1121 the patient began to have elevation of her white count and CT of the abdomen showed a re\re collection of fluid in the right lower quadrant extending over to the left lower quadrant again in the vesicouterine reflection of the anterior abdominal cavity.. On 11/ 22 and a repeat trip to the OR allowed placement of a drain again in the vesicouterine space, allowed to exit far to the right on the abdominal wall, lateral to McBurney's point and wound VAC was placed, markedly smaller in size. MBF replacement of the fascia was exchanged for a smaller fresh piece, which was sewn in place over the flat JP drain and then the wound VAC placed in the incision. On 01/14/2012 dressing was changed on the floor and showed dramatic improvement with all 4 clean and closing up from the bottom and the larger Pfannenstiel incision re\re sealed except for 2 cm at its inferior wound VAC was replaced and patient allowed to go home During the acute phase of care she received a total of 6 units packed cells, one unit of fresh frozen plasma in addition to the debridement patient will be followed with home health care for wound VAC management, continued on Invanz through her PICC line and seen back in the office 1 week for consideration of delayed secondary closure of wound.        Consults: general surgery  Significant Diagnostic Studies:   Treatments: antibiotics: Zosyn and cleocin, switched to INVANZ(ertapenem) 1 gm IV daily at discharge   Discharge Exam: Blood pressure 130/77, pulse 56, temperature 97.9 F (36.6 C), temperature source Oral, resp. rate 18, height 5\' 2"  (  1.575 m), weight 164 lb 3.9 oz (74.5 kg), last menstrual period 06/17/2011, SpO2 99.00%, currently breastfeeding. General appearance: alert, cooperative and no distress Resp: clear to auscultation bilaterally GI: normal findings: bowel sounds normal and abnormal findings:  see wound eval Incision/Wound:1 cm wide, x 1 cm deep  mesh placed in Pfannensteil incision wound at edge,and a small bit of mesh placed in each wound site.and Wound vac replaced.  Disposition: 01-Home or Self Care     Medication List     As of 01/14/2012  2:10 PM    ASK your doctor about these medications         dibucaine 1 % Oint   Commonly known as: NUPERCAINAL   Place 1 application rectally as needed.      ferrous sulfate 325 (65 FE) MG tablet   Take 1 tablet (325 mg total) by mouth 2 (two) times daily with a meal.      ibuprofen 200 MG tablet   Commonly known as: ADVIL,MOTRIN   Take 200 mg by mouth every 6 (six) hours as needed.      lanolin Oint   Apply 1 application topically as needed (for breast care).      levofloxacin 500 MG tablet   Commonly known as: LEVAQUIN   Take 1 tablet (500 mg total) by mouth daily.      metroNIDAZOLE 500 MG tablet   Commonly known as: FLAGYL   Take 1 tablet (500 mg total) by mouth 2 (two) times daily.      oxyCODONE-acetaminophen 5-325 MG per tablet   Commonly known as: PERCOCET/ROXICET   Take 1-2 tablets by mouth every 4 (four) hours as needed (moderate - severe pain).      witch hazel-glycerin pad   Commonly known as: TUCKS   Apply 1 application topically as needed.         SignedTilda Burrow 01/14/2012, 2:10 PM

## 2012-01-15 NOTE — Clinical Social Work Note (Signed)
CSW [place] received packet from NICU including patient education book from NICU and copies of photos of newborn.  CSW gave these to patient, encouraged patient to continue to contact NICU staff to monitor progress of baby.  Patient seemed appreciative.  Santa Genera, LCSW Clinical Social Worker [phone])

## 2012-01-15 NOTE — Progress Notes (Signed)
UR Chart Review Completed  

## 2012-01-16 ENCOUNTER — Encounter (HOSPITAL_COMMUNITY): Payer: Self-pay | Admitting: Obstetrics and Gynecology

## 2012-01-16 LAB — ANAEROBIC CULTURE

## 2012-01-21 ENCOUNTER — Other Ambulatory Visit (HOSPITAL_COMMUNITY): Payer: Self-pay | Admitting: Obstetrics and Gynecology

## 2012-01-21 NOTE — H&P (Signed)
Kara Gentry is an 36 y.o. female. She is returning to the operating room for wound closure after wound VAC management at home for the past week has resulted in good clean granulation tissue. She has a PICC line in place, and received her last dose of IV antibiotics Monday at 1 PM. She has had the wound inspected in the office .  It shows good healing and is ready for delayed closure the abdomen is nontender, the patient is tolerating diet well and her appetite is increasing  Pertinent Gynecological History: Menses: Postpartum, breast-feeding no menses since delivery Bleeding: Not applicable Contraception: abstinence DES exposure: unknown Blood transfusions: Received several units packed cells during recent hospitalization Sexually transmitted diseases: no past history Previous GYN Procedures: See list below of wound explorations  Last mammogram:  Date:  Last pap:  Date:  OB History: G2, P0111   Menstrual History: Menarche age:  No LMP recorded.    Past Medical History  Diagnosis Date  . Seasonal allergies   . No pertinent past medical history   . PONV (postoperative nausea and vomiting)     Past Surgical History  Procedure Date  . Ectopic pregnancy surgery   . Wrist surgery   . Mandible surgery cancer  . Nasal sinus surgery   . Cesarean section 12/22/2011    Procedure: CESAREAN SECTION;  Surgeon: Tanya S Pratt, MD;  Location: [facility];  Service: Obstetrics;  Laterality: N/A;  Primary cesarean section with delivery of Baby A girl at 1135. Baby B girl at 1135.  . Wound exploration 01/03/2012    Procedure: WOUND EXPLORATION;  Surgeon: Jani Moronta V Adayah Arocho, MD;  Location: [facility];  Service: Gynecology;  Laterality: N/A;  . Application of wound vac 01/03/2012    Procedure: APPLICATION OF WOUND VAC;  Surgeon: Arantza Darrington V Traxton Kolenda, MD;  Location: [facility];  Service: Gynecology;  Laterality: N/A;  . Insertion of mesh 01/03/2012    Procedure: INSERTION OF MESH;  Surgeon: Wylee Dorantes V Kayte Borchard, MD;   Location: [facility];  Service: Gynecology;  Laterality: N/A;  . Wound exploration 01/05/2012    Procedure: WOUND EXPLORATION;  Surgeon: Kamarii Carton V Steffani Dionisio, MD;  Location: [facility];  Service: Gynecology;  Laterality: N/A;  Re-Exploration of Wound Abscess with Debridement  . Application of wound vac 01/05/2012    Procedure: APPLICATION OF WOUND VAC;  Surgeon: Narciso Stoutenburg V Machai Desmith, MD;  Location: [facility];  Service: Gynecology;  Laterality: N/A;  . Wound exploration 01/07/2012    Procedure: WOUND EXPLORATION;  Surgeon: Mikaiah Stoffer V Petula Rotolo, MD;  Location: [facility];  Service: Gynecology;;  . Incision and drainage of wound 01/07/2012    Procedure: IRRIGATION AND DEBRIDEMENT WOUND;  Surgeon: Antinette Keough V Myriam Brandhorst, MD;  Location: [facility];  Service: Gynecology;;  . Application of wound vac 01/07/2012    Procedure: APPLICATION OF WOUND VAC;  Surgeon: Larin Weissberg V Teresea Donley, MD;  Location: [facility];  Service: Gynecology;;  . Wound exploration 01/11/2012    Procedure: WOUND EXPLORATION;  Surgeon: Aurielle Slingerland V Turner Baillie, MD;  Location: [facility];  Service: Gynecology;  Laterality: N/A;  Debridment of Wound Abscess  . Application of wound vac 01/11/2012    Procedure: APPLICATION OF WOUND VAC;  Surgeon: Myley Bahner V Lyanne Kates, MD;  Location: [facility];  Service: Gynecology;  Laterality: N/A;  Fasicial Closure    Family History  Problem Relation Age of Onset  . Arthritis    . Cancer    . Diabetes      Social History:  reports that she has   never smoked. She has never used smokeless tobacco. She reports that she does not drink alcohol or use illicit drugs.  Allergies: No Known Allergies   (Not in a hospital admission)  Review of Systems  Gastrointestinal: Negative for nausea, vomiting, diarrhea and constipation.  Genitourinary: Negative.     currently breastfeeding. Physical Exam  Vitals reviewed. Constitutional: She is oriented to person, place, and time. She appears well-developed and well-nourished.       Healthy appearing female, dramatically  improved from earlier admission  HENT:  Head: Normocephalic and atraumatic.  Neck: Normal range of motion. Neck supple. No thyromegaly present.  Cardiovascular: Normal rate and regular rhythm.   Respiratory: Effort normal.  GI: Soft. There is no tenderness. There is no rebound and no guarding.       Incisions (5) in lower abdomen inspected. The old Pfannenstiel incision shows healthy tissue,  good granulation the left side of the incision has healed tight with some retraction and will require mobilization tomorrow. The left side she is mobility and will be able to be reapproximated as is. The for upper incisions are all no longer packed with wet to dry dressings and are in good shape, reapproximating spontaneously. The right lower quadrant Jackson-Pratt drain his producing less than 15 cc per day of clear fluid  Neurological: She is alert and oriented to person, place, and time.  Skin: Skin is warm and dry.  Psychiatric: She has a normal mood and affect.    No results found for this or any previous visit (from the past 24 hour(s)).  No results found.  Assessment/Plan: Wound healing satisfactory status post necrotizing fasciitis infection after cesarean section, scheduled for closure of the wound and removal of J-P drain. She will be placed on oral antibiotics for one week post procedure. She is breast-feeding.  Merwin Breden V 01/21/2012, 5:44 PM  

## 2012-01-22 ENCOUNTER — Encounter (HOSPITAL_COMMUNITY): Payer: Self-pay | Admitting: Anesthesiology

## 2012-01-22 ENCOUNTER — Ambulatory Visit (HOSPITAL_COMMUNITY): Payer: BC Managed Care – PPO | Admitting: Anesthesiology

## 2012-01-22 ENCOUNTER — Ambulatory Visit (HOSPITAL_COMMUNITY)
Admission: RE | Admit: 2012-01-22 | Discharge: 2012-01-22 | Disposition: A | Discharge status: Home / Self Care | Payer: BC Managed Care – PPO | Source: Ambulatory Visit | Attending: Obstetrics and Gynecology | Admitting: Obstetrics and Gynecology

## 2012-01-22 ENCOUNTER — Encounter (HOSPITAL_COMMUNITY): Payer: Self-pay | Admitting: *Deleted

## 2012-01-22 ENCOUNTER — Encounter (HOSPITAL_COMMUNITY)
Admission: RE | Disposition: A | Discharge status: Home / Self Care | Payer: Self-pay | Source: Ambulatory Visit | Attending: Obstetrics and Gynecology

## 2012-01-22 DIAGNOSIS — M726 Necrotizing fasciitis: Secondary | ICD-10-CM | POA: Insufficient documentation

## 2012-01-22 DIAGNOSIS — Z481 Encounter for planned postprocedural wound closure: Secondary | ICD-10-CM

## 2012-01-22 HISTORY — PX: SECONDARY CLOSURE OF WOUND: SHX6208

## 2012-01-22 HISTORY — PX: WOUND EXPLORATION: SHX6188

## 2012-01-22 LAB — CBC WITH DIFFERENTIAL/PLATELET
Basophils Absolute: 0 10*3/uL (ref 0.0–0.1)
Basophils Relative: 1 % (ref 0–1)
Eosinophils Absolute: 0.1 10*3/uL (ref 0.0–0.7)
Eosinophils Relative: 2 % (ref 0–5)
HCT: 34 % — ABNORMAL LOW (ref 36.0–46.0)
Hemoglobin: 11.4 g/dL — ABNORMAL LOW (ref 12.0–15.0)
Lymphocytes Relative: 29 % (ref 12–46)
Lymphs Abs: 1.8 10*3/uL (ref 0.7–4.0)
MCH: 28.9 pg (ref 26.0–34.0)
MCHC: 33.5 g/dL (ref 30.0–36.0)
MCV: 86.3 fL (ref 78.0–100.0)
Monocytes Absolute: 0.3 10*3/uL (ref 0.1–1.0)
Monocytes Relative: 4 % (ref 3–12)
Neutro Abs: 3.9 10*3/uL (ref 1.7–7.7)
Neutrophils Relative %: 65 % (ref 43–77)
Platelets: 339 10*3/uL (ref 150–400)
RBC: 3.94 MIL/uL (ref 3.87–5.11)
RDW: 14.5 % (ref 11.5–15.5)
WBC: 6.1 10*3/uL (ref 4.0–10.5)

## 2012-01-22 LAB — SURGICAL PCR SCREEN
MRSA, PCR: NEGATIVE
Staphylococcus aureus: NEGATIVE

## 2012-01-22 LAB — BASIC METABOLIC PANEL
BUN: 5 mg/dL — ABNORMAL LOW (ref 6–23)
CO2: 27 mEq/L (ref 19–32)
Calcium: 9.3 mg/dL (ref 8.4–10.5)
Chloride: 109 mEq/L (ref 96–112)
Creatinine, Ser: 0.55 mg/dL (ref 0.50–1.10)
GFR calc Af Amer: >90 mL/min (ref >90)
GFR calc non Af Amer: >90 mL/min (ref >90)
Glucose, Bld: 83 mg/dL (ref 70–99)
Potassium: 3.3 mEq/L — ABNORMAL LOW (ref 3.5–5.1)
Sodium: 145 mEq/L (ref 135–145)

## 2012-01-22 SURGERY — WOUND EXPLORATION
Anesthesia: General | Site: Abdomen | Wound class: Clean

## 2012-01-22 MED ORDER — SCOPOLAMINE 1 MG/3DAYS TD PT72
1.0000 | MEDICATED_PATCH | Freq: Once | TRANSDERMAL | Status: DC
  Administered 2012-01-22: 1.5 mg via TRANSDERMAL

## 2012-01-22 MED ORDER — DEXAMETHASONE SODIUM PHOSPHATE 4 MG/ML IJ SOLN
INTRAMUSCULAR | Status: AC
  Filled 2012-01-22: qty 1

## 2012-01-22 MED ORDER — MIDAZOLAM HCL 2 MG/2ML IJ SOLN
1.0000 mg | INTRAMUSCULAR | Status: DC | PRN
  Administered 2012-01-22: 2 mg via INTRAVENOUS

## 2012-01-22 MED ORDER — LACTATED RINGERS IV SOLN
INTRAVENOUS | Status: DC
  Administered 2012-01-22: 1000 mL via INTRAVENOUS

## 2012-01-22 MED ORDER — SUCCINYLCHOLINE CHLORIDE 20 MG/ML IJ SOLN
INTRAMUSCULAR | Status: AC
  Filled 2012-01-22: qty 1

## 2012-01-22 MED ORDER — BUPIVACAINE HCL (PF) 0.5 % IJ SOLN
INTRAMUSCULAR | Status: AC
  Filled 2012-01-22: qty 30

## 2012-01-22 MED ORDER — ONDANSETRON HCL 4 MG/2ML IJ SOLN: 4.0000 mg | Freq: Once | INTRAMUSCULAR | Status: DC

## 2012-01-22 MED ORDER — LIDOCAINE HCL (CARDIAC) 20 MG/ML IV SOLN
INTRAVENOUS | Status: DC | PRN
  Administered 2012-01-22: 50 mg via INTRAVENOUS

## 2012-01-22 MED ORDER — SCOPOLAMINE 1 MG/3DAYS TD PT72
MEDICATED_PATCH | TRANSDERMAL | Status: AC
  Filled 2012-01-22: qty 1

## 2012-01-22 MED ORDER — LIDOCAINE HCL (PF) 1 % IJ SOLN
INTRAMUSCULAR | Status: AC
  Filled 2012-01-22: qty 5

## 2012-01-22 MED ORDER — SURGILUBE EX GEL
CUTANEOUS | Status: AC
  Filled 2012-01-22: qty 3

## 2012-01-22 MED ORDER — 0.9 % SODIUM CHLORIDE (POUR BTL) OPTIME
TOPICAL | Status: DC | PRN
  Administered 2012-01-22: 1000 mL

## 2012-01-22 MED ORDER — ONDANSETRON HCL 4 MG/2ML IJ SOLN
4.0000 mg | Freq: Once | INTRAMUSCULAR | Status: AC
  Administered 2012-01-22: 4 mg via INTRAVENOUS

## 2012-01-22 MED ORDER — CIPROFLOXACIN HCL 500 MG PO TABS: 500.0000 mg | ORAL_TABLET | Freq: Two times a day (BID) | ORAL | Status: DC

## 2012-01-22 MED ORDER — PROPOFOL 10 MG/ML IV EMUL
INTRAVENOUS | Status: AC
  Filled 2012-01-22: qty 20

## 2012-01-22 MED ORDER — CEFAZOLIN SODIUM-DEXTROSE 2-3 GM-% IV SOLR
2.0000 g | INTRAVENOUS | Status: AC
  Administered 2012-01-22: 2 g via INTRAVENOUS

## 2012-01-22 MED ORDER — GLYCOPYRROLATE 0.2 MG/ML IJ SOLN
INTRAMUSCULAR | Status: DC | PRN
  Administered 2012-01-22: 0.2 mg via INTRAVENOUS

## 2012-01-22 MED ORDER — SUCCINYLCHOLINE CHLORIDE 20 MG/ML IJ SOLN
INTRAMUSCULAR | Status: DC | PRN
  Administered 2012-01-22: 100 mg via INTRAVENOUS

## 2012-01-22 MED ORDER — GLYCOPYRROLATE 0.2 MG/ML IJ SOLN
INTRAMUSCULAR | Status: AC
  Filled 2012-01-22: qty 1

## 2012-01-22 MED ORDER — FENTANYL CITRATE 0.05 MG/ML IJ SOLN
INTRAMUSCULAR | Status: AC
  Filled 2012-01-22: qty 5

## 2012-01-22 MED ORDER — FENTANYL CITRATE 0.05 MG/ML IJ SOLN
INTRAMUSCULAR | Status: DC | PRN
  Administered 2012-01-22: 50 ug via INTRAVENOUS
  Administered 2012-01-22: 100 ug via INTRAVENOUS

## 2012-01-22 MED ORDER — LACTATED RINGERS IV SOLN
INTRAVENOUS | Status: DC | PRN
  Administered 2012-01-22: 13:00:00 via INTRAVENOUS

## 2012-01-22 MED ORDER — FENTANYL CITRATE 0.05 MG/ML IJ SOLN: 25.0000 ug | INTRAMUSCULAR | Status: DC | PRN

## 2012-01-22 MED ORDER — ONDANSETRON HCL 4 MG/2ML IJ SOLN
INTRAMUSCULAR | Status: AC
  Filled 2012-01-22: qty 2

## 2012-01-22 MED ORDER — CEFAZOLIN SODIUM-DEXTROSE 2-3 GM-% IV SOLR
INTRAVENOUS | Status: AC
  Filled 2012-01-22: qty 50

## 2012-01-22 MED ORDER — ONDANSETRON HCL 4 MG/2ML IJ SOLN: 4.0000 mg | Freq: Once | INTRAMUSCULAR | Status: DC | PRN

## 2012-01-22 MED ORDER — ROCURONIUM BROMIDE 100 MG/10ML IV SOLN
INTRAVENOUS | Status: DC | PRN
  Administered 2012-01-22 (×2): 10 mg via INTRAVENOUS

## 2012-01-22 MED ORDER — PROPOFOL 10 MG/ML IV EMUL
INTRAVENOUS | Status: DC | PRN
  Administered 2012-01-22: 150 mg via INTRAVENOUS

## 2012-01-22 MED ORDER — ONDANSETRON HCL 4 MG/2ML IJ SOLN
INTRAMUSCULAR | Status: DC | PRN
  Administered 2012-01-22: 4 mg via INTRAVENOUS

## 2012-01-22 MED ORDER — MUPIROCIN 2 % EX OINT
TOPICAL_OINTMENT | CUTANEOUS | Status: AC
  Filled 2012-01-22: qty 22

## 2012-01-22 MED ORDER — BUPIVACAINE HCL (PF) 0.5 % IJ SOLN
INTRAMUSCULAR | Status: DC | PRN
  Administered 2012-01-22: 10 mL

## 2012-01-22 MED ORDER — NEOSTIGMINE METHYLSULFATE 1 MG/ML IJ SOLN
INTRAMUSCULAR | Status: DC | PRN
  Administered 2012-01-22: 2 mg via INTRAVENOUS

## 2012-01-22 MED ORDER — MIDAZOLAM HCL 2 MG/2ML IJ SOLN
INTRAMUSCULAR | Status: AC
  Filled 2012-01-22: qty 2

## 2012-01-22 MED ORDER — ROCURONIUM BROMIDE 50 MG/5ML IV SOLN
INTRAVENOUS | Status: AC
  Filled 2012-01-22: qty 1

## 2012-01-22 MED ORDER — DEXAMETHASONE SODIUM PHOSPHATE 4 MG/ML IJ SOLN
4.0000 mg | Freq: Once | INTRAMUSCULAR | Status: AC
  Administered 2012-01-22: 4 mg via INTRAVENOUS

## 2012-01-22 SURGICAL SUPPLY — 40 items
APL SKNCLS STERI-STRIP NONHPOA (GAUZE/BANDAGES/DRESSINGS) ×1
BAG HAMPER (MISCELLANEOUS) ×2 IMPLANT
BENZOIN TINCTURE PRP APPL 2/3 (GAUZE/BANDAGES/DRESSINGS) ×1 IMPLANT
CLOTH BEACON ORANGE TIMEOUT ST (SAFETY) ×2 IMPLANT
COVER LIGHT HANDLE STERIS (MISCELLANEOUS) ×4 IMPLANT
DRESSING COVERLET 3X1 FLEXIBLE (GAUZE/BANDAGES/DRESSINGS) ×1 IMPLANT
ELECT REM PT RETURN 9FT ADLT (ELECTROSURGICAL) ×2
ELECTRODE REM PT RTRN 9FT ADLT (ELECTROSURGICAL) ×1 IMPLANT
EVACUATOR DRAINAGE 10X20 100CC (DRAIN) ×1 IMPLANT
EVACUATOR SILICONE 100CC (DRAIN)
GAUZE PACKING 2X5 YD STERILE (GAUZE/BANDAGES/DRESSINGS) ×2 IMPLANT
GLOVE BIOGEL PI IND STRL 7.5 (GLOVE) IMPLANT
GLOVE BIOGEL PI INDICATOR 7.5 (GLOVE) ×1
GLOVE ECLIPSE 7.0 STRL STRAW (GLOVE) ×1 IMPLANT
GLOVE ECLIPSE 9.0 STRL (GLOVE) ×2 IMPLANT
GLOVE EXAM NITRILE MD LF STRL (GLOVE) ×1 IMPLANT
GLOVE INDICATOR STER SZ 9 (GLOVE) ×2 IMPLANT
GOWN STRL REIN 3XL LVL4 (GOWN DISPOSABLE) ×2 IMPLANT
GOWN STRL REIN XL XLG (GOWN DISPOSABLE) ×2 IMPLANT
INST SET MAJOR GENERAL (KITS) ×2 IMPLANT
KIT ROOM TURNOVER APOR (KITS) ×2 IMPLANT
MANIFOLD NEPTUNE II (INSTRUMENTS) ×2 IMPLANT
NDL HYPO 25X1 1.5 SAFETY (NEEDLE) IMPLANT
NEEDLE HYPO 25X1 1.5 SAFETY (NEEDLE) ×2 IMPLANT
NS IRRIG 1000ML POUR BTL (IV SOLUTION) ×2 IMPLANT
PACK ABDOMINAL MAJOR (CUSTOM PROCEDURE TRAY) ×2 IMPLANT
PAD ABD 5X9 TENDERSORB (GAUZE/BANDAGES/DRESSINGS) ×1 IMPLANT
PAD ARMBOARD 7.5X6 YLW CONV (MISCELLANEOUS) ×2 IMPLANT
SET BASIN LINEN APH (SET/KITS/TRAYS/PACK) ×2 IMPLANT
SOL PREP PROV IODINE SCRUB 4OZ (MISCELLANEOUS) ×2 IMPLANT
SPONGE GAUZE 4X4 12PLY (GAUZE/BANDAGES/DRESSINGS) ×1 IMPLANT
STRIP CLOSURE SKIN 1/2X4 (GAUZE/BANDAGES/DRESSINGS) ×1 IMPLANT
SUT ETHILON 3 0 FSL (SUTURE) IMPLANT
SUT PLAIN CT 1/2CIR 2-0 27IN (SUTURE) ×1 IMPLANT
SUT PROLENE 0 CT 1 30 (SUTURE) ×2 IMPLANT
SUT PROLENE 3 0 PS 1 (SUTURE) ×3 IMPLANT
SYR CONTROL 10ML LL (SYRINGE) ×1 IMPLANT
TAPE CLOTH SURG 4X10 WHT LF (GAUZE/BANDAGES/DRESSINGS) ×1 IMPLANT
TOWEL OR 17X26 4PK STRL BLUE (TOWEL DISPOSABLE) ×1 IMPLANT
YANKAUER SUCT BULB TIP 10FT TU (MISCELLANEOUS) ×1 IMPLANT

## 2012-01-22 NOTE — Anesthesia Postprocedure Evaluation (Signed)
  Anesthesia Post-op Note  Patient: Kara Gentry  Procedure(s) Performed: Procedure(s) (LRB) with comments: WOUND EXPLORATION (N/A) - Delayed Secondary Wound Closure SECONDARY CLOSURE OF WOUND (N/A)  Patient Location: PACU  Anesthesia Type:General  Level of Consciousness: awake, alert  and oriented  Airway and Oxygen Therapy: Patient Spontanous Breathing  Post-op Pain: mild  Post-op Assessment: Post-op Vital signs reviewed, Patient's Cardiovascular Status Stable, Respiratory Function Stable, Patent Airway, No signs of Nausea or vomiting, Adequate PO intake, Pain level controlled, No headache, No backache, No residual numbness and No residual motor weakness  Post-op Vital Signs: Reviewed and stable  Complications: No apparent anesthesia complications

## 2012-01-22 NOTE — H&P (View-Only) (Signed)
Kara Gentry is an 36 y.o. female. She is returning to the operating room for wound closure after wound VAC management at home for the past week has resulted in good clean granulation tissue. She has a PICC line in place, and received her last dose of IV antibiotics Monday at 1 PM. She has had the wound inspected in the office .  It shows good healing and is ready for delayed closure the abdomen is nontender, the patient is tolerating diet well and her appetite is increasing  Pertinent Gynecological History: Menses: Postpartum, breast-feeding no menses since delivery Bleeding: Not applicable Contraception: abstinence DES exposure: unknown Blood transfusions: Received several units packed cells during recent hospitalization Sexually transmitted diseases: no past history Previous GYN Procedures: See list below of wound explorations  Last mammogram:  Date:  Last pap:  Date:  OB History: G2, P0111   Menstrual History: Menarche age:  No LMP recorded.    Past Medical History  Diagnosis Date  . Seasonal allergies   . No pertinent past medical history   . PONV (postoperative nausea and vomiting)     Past Surgical History  Procedure Date  . Ectopic pregnancy surgery   . Wrist surgery   . Mandible surgery cancer  . Nasal sinus surgery   . Cesarean section 12/22/2011    Procedure: CESAREAN SECTION;  Surgeon: Reva Bores, MD;  Location: [facility];  Service: Obstetrics;  Laterality: N/A;  Primary cesarean section with delivery of Baby A girl at 31. Baby B girl at 55.  Marland Kitchen Wound exploration 01/03/2012    Procedure: WOUND EXPLORATION;  Surgeon: Tilda Burrow, MD;  Location: [facility];  Service: Gynecology;  Laterality: N/A;  . Application of wound vac 01/03/2012    Procedure: APPLICATION OF WOUND VAC;  Surgeon: Tilda Burrow, MD;  Location: [facility];  Service: Gynecology;  Laterality: N/A;  . Insertion of mesh 01/03/2012    Procedure: INSERTION OF MESH;  Surgeon: Tilda Burrow, MD;   Location: [facility];  Service: Gynecology;  Laterality: N/A;  . Wound exploration 01/05/2012    Procedure: WOUND EXPLORATION;  Surgeon: Tilda Burrow, MD;  Location: [facility];  Service: Gynecology;  Laterality: N/A;  Re-Exploration of Wound Abscess with Debridement  . Application of wound vac 01/05/2012    Procedure: APPLICATION OF WOUND VAC;  Surgeon: Tilda Burrow, MD;  Location: [facility];  Service: Gynecology;  Laterality: N/A;  . Wound exploration 01/07/2012    Procedure: WOUND EXPLORATION;  Surgeon: Tilda Burrow, MD;  Location: [facility];  Service: Gynecology;;  . Incision and drainage of wound 01/07/2012    Procedure: IRRIGATION AND DEBRIDEMENT WOUND;  Surgeon: Tilda Burrow, MD;  Location: [facility];  Service: Gynecology;;  . Application of wound vac 01/07/2012    Procedure: APPLICATION OF WOUND VAC;  Surgeon: Tilda Burrow, MD;  Location: [facility];  Service: Gynecology;;  . Wound exploration 01/11/2012    Procedure: WOUND EXPLORATION;  Surgeon: Tilda Burrow, MD;  Location: [facility];  Service: Gynecology;  Laterality: N/A;  Debridment of Wound Abscess  . Application of wound vac 01/11/2012    Procedure: APPLICATION OF WOUND VAC;  Surgeon: Tilda Burrow, MD;  Location: [facility];  Service: Gynecology;  Laterality: N/A;  Fasicial Closure    Family History  Problem Relation Age of Onset  . Arthritis    . Cancer    . Diabetes      Social History:  reports that she has  never smoked. She has never used smokeless tobacco. She reports that she does not drink alcohol or use illicit drugs.  Allergies: No Known Allergies   (Not in a hospital admission)  Review of Systems  Gastrointestinal: Negative for nausea, vomiting, diarrhea and constipation.  Genitourinary: Negative.     currently breastfeeding. Physical Exam  Vitals reviewed. Constitutional: She is oriented to person, place, and time. She appears well-developed and well-nourished.       Healthy appearing female, dramatically  improved from earlier admission  HENT:  Head: Normocephalic and atraumatic.  Neck: Normal range of motion. Neck supple. No thyromegaly present.  Cardiovascular: Normal rate and regular rhythm.   Respiratory: Effort normal.  GI: Soft. There is no tenderness. There is no rebound and no guarding.       Incisions (5) in lower abdomen inspected. The old Pfannenstiel incision shows healthy tissue,  good granulation the left side of the incision has healed tight with some retraction and will require mobilization tomorrow. The left side she is mobility and will be able to be reapproximated as is. The for upper incisions are all no longer packed with wet to dry dressings and are in good shape, reapproximating spontaneously. The right lower quadrant Jackson-Pratt drain his producing less than 15 cc per day of clear fluid  Neurological: She is alert and oriented to person, place, and time.  Skin: Skin is warm and dry.  Psychiatric: She has a normal mood and affect.    No results found for this or any previous visit (from the past 24 hour(s)).  No results found.  Assessment/Plan: Wound healing satisfactory status post necrotizing fasciitis infection after cesarean section, scheduled for closure of the wound and removal of J-P drain. She will be placed on oral antibiotics for one week post procedure. She is breast-feeding.  Elizar Alpern V 01/21/2012, 5:44 PM

## 2012-01-22 NOTE — Interval H&P Note (Signed)
History and Physical Interval Note:  01/22/2012 12:46 PM  Kara Gentry  has presented today for surgery, with the diagnosis of necrotizing fascitis resolved  The various methods of treatment have been discussed with the patient and family. After consideration of risks, benefits and other options for treatment, the patient has consented to  Procedure(s) (LRB) with comments: WOUND EXPLORATION (N/A) - Delayed Secondary Wound Closure as a surgical intervention .  The patient's history has been reviewed, patient examined, no change in status, stable for surgery.  I have reviewed the patient's chart and labs.  Questions were answered to the patient's satisfaction.     Tilda Burrow

## 2012-01-22 NOTE — Anesthesia Preprocedure Evaluation (Addendum)
Anesthesia Evaluation  Patient identified by MRN, date of birth, ID band Patient awake    Reviewed: Allergy & Precautions, H&P , NPO status , Patient's Chart, lab work & pertinent test results  History of Anesthesia Complications (+) PONV  Airway Mallampati: II  Neck ROM: Full    Dental  (+) Teeth Intact   Pulmonary neg pulmonary ROS,  breath sounds clear to auscultation        Cardiovascular negative cardio ROS  Rhythm:Regular     Neuro/Psych    GI/Hepatic GERD-  Controlled and Medicated,  Endo/Other    Renal/GU      Musculoskeletal   Abdominal   Peds  Hematology   Anesthesia Other Findings   Reproductive/Obstetrics Csection [redacted]weeks gestation; transverse lie                           Anesthesia Physical Anesthesia Plan  ASA: II  Anesthesia Plan: General   Post-op Pain Management:    Induction: Intravenous, Rapid sequence and Cricoid pressure planned  Airway Management Planned: Oral ETT  Additional Equipment:   Intra-op Plan:   Post-operative Plan: Extubation in OR  Informed Consent: I have reviewed the patients History and Physical, chart, labs and discussed the procedure including the risks, benefits and alternatives for the proposed anesthesia with the patient or authorized representative who has indicated his/her understanding and acceptance.     Plan Discussed with:   Anesthesia Plan Comments:         Anesthesia Quick Evaluation

## 2012-01-22 NOTE — Transfer of Care (Signed)
Immediate Anesthesia Transfer of Care Note  Patient: Kara Gentry  Procedure(s) Performed: Procedure(s) (LRB) with comments: WOUND EXPLORATION (N/A) - Delayed Secondary Wound Closure SECONDARY CLOSURE OF WOUND (N/A)  Patient Location: PACU  Anesthesia Type:General  Level of Consciousness: awake, alert  and oriented  Airway & Oxygen Therapy: Patient Spontanous Breathing and Patient connected to face mask oxygen  Post-op Assessment: Report given to PACU RN and Post -op Vital signs reviewed and stable  Post vital signs: Reviewed and stable  Complications: No apparent anesthesia complications

## 2012-01-22 NOTE — Op Note (Signed)
See the operative note detail included in the brief op note

## 2012-01-22 NOTE — Progress Notes (Signed)
Potassium of 3.3 reported to Dr. Jayme Cloud. No additional orders received

## 2012-01-22 NOTE — Preoperative (Signed)
Beta Blockers   Reason not to administer Beta Blockers:Not Applicable 

## 2012-01-22 NOTE — Brief Op Note (Signed)
01/22/2012  1:52 PM  PATIENT:  Kara Gentry  35 y.o. female  PRE-OPERATIVE DIAGNOSIS:  necrotizing fascitis resolved  POST-OPERATIVE DIAGNOSIS:  necrotizing fascitis resolved  PROCEDURE:  Procedure(s) (LRB) with comments: WOUND EXPLORATION (N/A) - Delayed Secondary Wound Closure SECONDARY CLOSURE OF WOUND (N/A) Removal of PICC line, removal of subfascial JP drain SURGEON:  Surgeon(s) and Role:    * Tilda Burrow, MD - Primary  PHYSICIAN ASSISTANT: None  ASSISTANTS: [place] CST   ANESTHESIA:   local and general  EBL:  Total I/O In: -  Out: 30 [Blood:30]  BLOOD ADMINISTERED:none  DRAINS: none   LOCAL MEDICATIONS USED:  MARCAINE    and Amount: 10 ml  SPECIMEN:  No Specimen  DISPOSITION OF SPECIMEN:  N/A  COUNTS:  YES  TOURNIQUET:  * No tourniquets in log *  DICTATION: .Dragon Dictation Patient was taken to the operating room for wound closure. Abdomen was prepped and draped in standard fashion. The right lower quadrant Jackson-Pratt drain was inspected and shown to have only a small amount of clear serous drainage and was removed. Timeout had been conducted and surgical procedure confirmed with surgical team, with Ancef 2 g administered preoperatively. After prepping and draping, attention was directed to the lesser incisions. 2 of the incisions, the far left incision, lateral and superior to the left anterior superior iliac crest was closed sufficiently to only required Steri-Strips. The 4 cm incision just to the left and the inferior tof the umbilicus was opened and irrigated to a depth of 2 cm and was quite healthy in appearance it was closed superficially with running 3- 0 Prolene. The 2 cm incision just to the right of the umbilicus and inferior, was Steri-Stripped. The fourth smaller site on the right lower quadrant was also probed to a depth of about 2 cm found to be closed and the fat and was closed superficially with running 3-0 Prolene.  The larger wound from  the Pfannenstiel incision was inspected. The left two thirds of the incision had curled under to the point that the superior edge needed to be trimmed back approximately 1 cm to expose healthy tissues and eliminate the curling of the skin edge. On the deeper portions the tissues were densely adherent to the underlying musculature and connective tissue was left as is on the inferior edge of the incision, the firm fibrous surface of tissue was transected beginning approximately 5 mm beneath the skin, taking out a 1 cm wide ellipse of the firm fibrous tissue to expose healthy underlying fatty tissues for improved mobility of the inferior skin edges. She was released sufficiently that it can be mobilized cephalad and reapproximated to the only was used on the left two thirds of the incision with good tissue its approximation.Point cautery was used minimally, but as necessary for hemostasis. The right one third of the incisions was reapproximated with 3 interrupted 3-0 Prolene suture. Sterile gauze wick was placed through the skin at the left end of the Pfannenstiel incision and the end of the continuous running suture and will be removed in 48 hours in the office.Pressure dressing was applied and patient allowed to go to recovery room in good condition., With sponge and needle counts correct. PICC line will be removed in the recovery room.  PLAN OF CARE: Discharge to home after PACU  PATIENT DISPOSITION:  PACU - hemodynamically stable.   Delay start of Pharmacological VTE agent (>24hrs) due to surgical blood loss or risk of bleeding: not applicable

## 2012-01-24 ENCOUNTER — Encounter (HOSPITAL_COMMUNITY): Payer: Self-pay | Admitting: Obstetrics and Gynecology

## 2012-01-25 ENCOUNTER — Encounter (HOSPITAL_COMMUNITY)
Admission: RE | Admit: 2012-01-25 | Discharge: 2012-01-25 | Disposition: A | Discharge status: Home / Self Care | Payer: BC Managed Care – PPO | Source: Ambulatory Visit | Attending: Pulmonary Disease | Admitting: Pulmonary Disease

## 2012-01-25 DIAGNOSIS — O923 Agalactia: Secondary | ICD-10-CM | POA: Insufficient documentation

## 2012-04-28 ENCOUNTER — Other Ambulatory Visit: Payer: Self-pay | Admitting: Obstetrics & Gynecology

## 2012-04-28 DIAGNOSIS — N949 Unspecified condition associated with female genital organs and menstrual cycle: Secondary | ICD-10-CM

## 2012-05-12 ENCOUNTER — Other Ambulatory Visit: Payer: Self-pay | Admitting: Obstetrics and Gynecology

## 2012-05-12 ENCOUNTER — Ambulatory Visit (INDEPENDENT_AMBULATORY_CARE_PROVIDER_SITE_OTHER): Payer: BC Managed Care – PPO

## 2012-05-12 ENCOUNTER — Ambulatory Visit (INDEPENDENT_AMBULATORY_CARE_PROVIDER_SITE_OTHER): Payer: BC Managed Care – PPO | Admitting: Obstetrics and Gynecology

## 2012-05-12 ENCOUNTER — Encounter: Payer: Self-pay | Admitting: Obstetrics and Gynecology

## 2012-05-12 VITALS — BP 100/78 | Ht 62.0 in | Wt 167.0 lb

## 2012-05-12 DIAGNOSIS — Z9889 Other specified postprocedural states: Secondary | ICD-10-CM

## 2012-05-12 DIAGNOSIS — N949 Unspecified condition associated with female genital organs and menstrual cycle: Secondary | ICD-10-CM

## 2012-05-12 DIAGNOSIS — Z09 Encounter for follow-up examination after completed treatment for conditions other than malignant neoplasm: Secondary | ICD-10-CM

## 2012-05-12 DIAGNOSIS — N83209 Unspecified ovarian cyst, unspecified side: Secondary | ICD-10-CM

## 2012-05-12 NOTE — Progress Notes (Signed)
Subjective:     Kara Gentry is a 37 y.o. female here for a routine exam in followup of post-cesarean wound infection complicated by necrotizing fasciitis..  Current complaints: bm's more frequent, loose Periods are 4 days, less pain than before prior pregnancy.  Personal health questionnaire reviewed: no.   Gynecologic History Patient's last menstrual period was 05/11/2012. Contraception: nonecondoms. Needs HSG.WILL NEED ANALGESICS FOR PROCEDURE Last Pap: 09/2011. Results were: normal Last mammogram: . Results were:  WILL SEEK SURROGATE FOR FUTURE PREGNANCY Obstetric History OB History   Grav Para Term Preterm Abortions TAB SAB Ect Mult Living   3 1  1 2  2  1 2      # Outc Date GA Lbr Len/2nd Wgt Sex Del Anes PTL Lv   1A PRE 11/13 [redacted]w[redacted]d 00:00 1lb0.9oz(0.479kg) F LVCS Spinal  Yes   1B  11/13 [redacted]w[redacted]d 00:00 15.5oz(0.44kg) F CS-High Vert Spinal  Yes   Comments: extreme preterm   2 SAB            3 SAB                DOING WELLOVERALL  Review of Systems A comprehensive review of systems was negative.    Objective:    No exam performed today, DISCUSSION AND PLANNING.    Assessment:    Healthy female exam.    Plan:    hSG AFTER APRIL MENSES

## 2012-05-12 NOTE — Patient Instructions (Signed)
NOTIFY MY OFFICE OF ONSET OF NEXT MENSES CONTACT TONYA WILLIS AT [facility] REGARDING SUPPORT SERVICES FOR FAMILIES WHO LOST INFANTS.[phone]

## 2012-06-09 ENCOUNTER — Other Ambulatory Visit: Payer: Self-pay | Admitting: Obstetrics and Gynecology

## 2012-06-09 DIAGNOSIS — Z3141 Encounter for fertility testing: Secondary | ICD-10-CM

## 2012-06-13 ENCOUNTER — Ambulatory Visit (HOSPITAL_COMMUNITY)
Admission: RE | Admit: 2012-06-13 | Discharge: 2012-06-13 | Disposition: A | Discharge status: Home / Self Care | Payer: BC Managed Care – PPO | Source: Ambulatory Visit | Attending: Obstetrics and Gynecology | Admitting: Obstetrics and Gynecology

## 2012-06-13 DIAGNOSIS — N978 Female infertility of other origin: Secondary | ICD-10-CM

## 2012-06-13 DIAGNOSIS — Z8614 Personal history of Methicillin resistant Staphylococcus aureus infection: Secondary | ICD-10-CM | POA: Insufficient documentation

## 2012-06-13 DIAGNOSIS — Z3141 Encounter for fertility testing: Secondary | ICD-10-CM | POA: Insufficient documentation

## 2012-06-13 MED ORDER — IOHEXOL 300 MG/ML  SOLN
50.0000 mL | Freq: Once | INTRAMUSCULAR | Status: AC | PRN
  Administered 2012-06-13: 10 mL via INTRAVENOUS

## 2012-06-19 ENCOUNTER — Telehealth: Payer: Self-pay | Admitting: Adult Health

## 2012-06-23 ENCOUNTER — Telehealth: Payer: Self-pay | Admitting: *Deleted

## 2012-06-23 NOTE — Telephone Encounter (Signed)
Pt informed can pick up FMLA forms tomorrow.

## 2012-06-24 NOTE — Telephone Encounter (Signed)
chrystal spoke with patient  Closing encounter

## 2012-09-03 ENCOUNTER — Ambulatory Visit (INDEPENDENT_AMBULATORY_CARE_PROVIDER_SITE_OTHER): Payer: BC Managed Care – PPO | Admitting: Obstetrics and Gynecology

## 2012-09-03 ENCOUNTER — Encounter: Payer: Self-pay | Admitting: Obstetrics and Gynecology

## 2012-09-03 VITALS — BP 120/80 | Ht 62.0 in | Wt 179.4 lb

## 2012-09-03 DIAGNOSIS — E669 Obesity, unspecified: Secondary | ICD-10-CM

## 2012-09-03 NOTE — Progress Notes (Signed)
Patient ID: Kara Gentry, female   DOB: [birth year], 37 y.o.   MRN: [id number] Pt states she has had some weight gain since her last procedure. Pt states she has bloating around the time of her period. Pt states she has changed her eating habits and been exercising and she still can't lose the weight. Pt states she gets really stiff after sitting for any length of time. The stiffness is from above her belly button to her thighs.   Walks daily.  Stomach isnt  Showing any evidence of hernia. A: weight gain P ;   chk TSH   Calorie counting   Weight bearing exercises

## 2012-09-03 NOTE — Patient Instructions (Signed)
Exercise - weight bearing Calorie counting Find a buddy to answer to.weekly or more Pray for focus Sweat  Check thyroid

## 2012-09-04 LAB — TSH: TSH: 1.061 u[IU]/mL (ref 0.350–4.500)

## 2012-12-25 ENCOUNTER — Other Ambulatory Visit: Payer: Self-pay

## 2013-06-29 ENCOUNTER — Telehealth: Payer: Self-pay | Admitting: Adult Health

## 2013-06-29 NOTE — Telephone Encounter (Signed)
Left message to call.

## 2013-09-11 ENCOUNTER — Other Ambulatory Visit (HOSPITAL_COMMUNITY)
Admission: RE | Admit: 2013-09-11 | Discharge: 2013-09-11 | Disposition: A | Discharge status: Home / Self Care | Payer: BC Managed Care – PPO | Source: Ambulatory Visit | Attending: Obstetrics and Gynecology | Admitting: Obstetrics and Gynecology

## 2013-09-11 ENCOUNTER — Encounter: Payer: Self-pay | Admitting: Obstetrics and Gynecology

## 2013-09-11 ENCOUNTER — Ambulatory Visit (INDEPENDENT_AMBULATORY_CARE_PROVIDER_SITE_OTHER): Payer: BC Managed Care – PPO | Admitting: Obstetrics and Gynecology

## 2013-09-11 VITALS — BP 116/90 | Ht 62.2 in | Wt 174.0 lb

## 2013-09-11 DIAGNOSIS — Z01419 Encounter for gynecological examination (general) (routine) without abnormal findings: Secondary | ICD-10-CM | POA: Insufficient documentation

## 2013-09-11 DIAGNOSIS — Z1151 Encounter for screening for human papillomavirus (HPV): Secondary | ICD-10-CM | POA: Insufficient documentation

## 2013-09-11 NOTE — Progress Notes (Signed)
This chart was scribed by Steva Colder, Medical Scribe, for Dr. Mallory Shirk on 09/11/13 at 1:22 PM. This chart was reviewed by Dr. Mallory Shirk for accuracy.   Assessment:  Annual Gyn Exam   Plan:  1. pap smear done, next pap due one year 2. return annually or prn 3    Annual mammogram advised Subjective:  Kara Gentry is a 38 y.o. female [id number] who presents for annual exam. Patient's last menstrual period was 08/23/2013. The patient has complaints today of an annual exam. She states that she wants to know how the process would be because she had a post-cesarean wound infection complicated by necrotizing fascitis. She states that she wants to know what the probability of her getting pregnant would be considering her past complications. She states that she had her sister-in-law carried her first child that she had. She wants to know if this is what she should continue with her sister-in-law being her carrier. She states that she had the dye test method done to see if her tubes were blocked.   The following portions of the patient's history were reviewed and updated as appropriate: allergies, current medications, past family history, past medical history, past social history, past surgical history and problem list. Past Medical History  Diagnosis Date  . Seasonal allergies   . No pertinent past medical history   . PONV (postoperative nausea and vomiting)     Past Surgical History  Procedure Laterality Date  . Ectopic pregnancy surgery    . Wrist surgery    . Mandible surgery  cancer  . Nasal sinus surgery    . Cesarean section  12/22/2011    Procedure: CESAREAN SECTION;  Surgeon: Donnamae Jude, MD;  Location: [facility];  Service: Obstetrics;  Laterality: N/A;  Primary cesarean section with delivery of Baby A girl at 43. Baby B girl at 58.  Marland Kitchen Wound exploration  01/03/2012    Procedure: WOUND EXPLORATION;  Surgeon: Jonnie Kind, MD;  Location: [facility];  Service: Gynecology;   Laterality: N/A;  . Application of wound vac  01/03/2012    Procedure: APPLICATION OF WOUND VAC;  Surgeon: Jonnie Kind, MD;  Location: [facility];  Service: Gynecology;  Laterality: N/A;  . Insertion of mesh  01/03/2012    Procedure: INSERTION OF MESH;  Surgeon: Jonnie Kind, MD;  Location: [facility];  Service: Gynecology;  Laterality: N/A;  . Wound exploration  01/05/2012    Procedure: WOUND EXPLORATION;  Surgeon: Jonnie Kind, MD;  Location: [facility];  Service: Gynecology;  Laterality: N/A;  Re-Exploration of Wound Abscess with Debridement  . Application of wound vac  01/05/2012    Procedure: APPLICATION OF WOUND VAC;  Surgeon: Jonnie Kind, MD;  Location: [facility];  Service: Gynecology;  Laterality: N/A;  . Wound exploration  01/07/2012    Procedure: WOUND EXPLORATION;  Surgeon: Jonnie Kind, MD;  Location: [facility];  Service: Gynecology;;  . Incision and drainage of wound  01/07/2012    Procedure: IRRIGATION AND DEBRIDEMENT WOUND;  Surgeon: Jonnie Kind, MD;  Location: [facility];  Service: Gynecology;;  . Application of wound vac  01/07/2012    Procedure: APPLICATION OF WOUND VAC;  Surgeon: Jonnie Kind, MD;  Location: [facility];  Service: Gynecology;;  . Wound exploration  01/11/2012    Procedure: WOUND EXPLORATION;  Surgeon: Jonnie Kind, MD;  Location: [facility];  Service: Gynecology;  Laterality: N/A;  Debridment of Wound Abscess  .  Application of wound vac  01/11/2012    Procedure: APPLICATION OF WOUND VAC;  Surgeon: Jonnie Kind, MD;  Location: [facility];  Service: Gynecology;  Laterality: N/A;  Fasicial Closure  . Wound exploration  01/22/2012    Procedure: WOUND EXPLORATION;  Surgeon: Jonnie Kind, MD;  Location: [facility];  Service: Gynecology;  Laterality: N/A;  Delayed Secondary Wound Closure  . Secondary closure of wound  01/22/2012    Procedure: SECONDARY CLOSURE OF WOUND;  Surgeon: Jonnie Kind, MD;  Location: [facility];  Service: Gynecology;  Laterality: N/A;    Current  outpatient prescriptions:ibuprofen (ADVIL,MOTRIN) 200 MG tablet, Take 200 mg by mouth every 6 (six) hours as needed., Disp: , Rfl:   Review of Systems Constitutional: negative Gastrointestinal: negative Genitourinary:   Objective:  BP 116/90  Ht 5' 2.2" (1.58 m)  Wt 174 lb (78.926 kg)  BMI 31.62 kg/m2  LMP 08/23/2013   BMI: Body mass index is 31.62 kg/(m^2).  General Appearance: Alert, appropriate appearance for age. No acute distress Breast Exam: No dimpling, nipple retraction or discharge. No masses or nodes., Normal to inspection, Normal breast tissue bilaterally and No masses or nodes.No dimpling, nipple retraction or discharge. Gastrointestinal: Soft, non-tender, no masses or organomegaly Abdomen: No hernia present, masses, scars well healed. H/o post-cesarean wound infection complicated by necrotizing fascitis. Pelvic Exam: External genitalia: normal general appearance Vaginal: normal mucosa without prolapse or lesions and lite discharge Cervix: normal appearance, vigorous bleeding upon exam, friable  Adnexa: normal bimanual exam and mobile uterus without adhesion Uterus: anteverted Rectal: n/a  Urinalysis:Not done  Mallory Shirk. MD Pgr [phone] 1:41 PM   A.  1. Normal gyn for age.  2. H/o post-cesarean wound infection complicated by necrotizing fascitis.  3. Tubal obstruction by previous HST.  4. Considering surrogate pregnancy.   P.  1. Ultrasound in one month  2. Decide to evalulate uterine integrity.

## 2013-09-11 NOTE — Patient Instructions (Signed)
Follow up in 1 month for a u/s

## 2013-09-16 LAB — CYTOLOGY - PAP

## 2013-09-28 ENCOUNTER — Ambulatory Visit (INDEPENDENT_AMBULATORY_CARE_PROVIDER_SITE_OTHER): Payer: BC Managed Care – PPO

## 2013-09-28 ENCOUNTER — Other Ambulatory Visit: Payer: Self-pay | Admitting: Obstetrics and Gynecology

## 2013-09-28 DIAGNOSIS — N926 Irregular menstruation, unspecified: Secondary | ICD-10-CM

## 2013-09-28 DIAGNOSIS — N736 Female pelvic peritoneal adhesions (postinfective): Secondary | ICD-10-CM

## 2013-12-04 ENCOUNTER — Other Ambulatory Visit: Payer: Self-pay

## 2013-12-21 ENCOUNTER — Encounter: Payer: Self-pay | Admitting: Obstetrics and Gynecology

## 2014-03-09 IMAGING — CT CT ABD-PELV W/ CM
2 of 5 series · 16 of 46 positions shown, 18 images · IV contrast (Omnipaque 300)
Comparison: CT eleventh 42,013

CLINICAL DATA: Wound infection following emergent cesarean section.
Subsequent necrotizing fasciitis and debridement.  Pelvic abscess.

CT ABDOMEN AND PELVIS WITH CONTRAST
TECHNIQUE: Multidetector CT imaging of the abdomen and pelvis was
performed following the standard protocol during bolus
administration of intravenous contrast.
Contrast: 100mL OMNIPAQUE IOHEXOL 300 MG/ML  SOLN

[Series 2: abd_pel_with 5.0 b40f · axial · 0.67mm/px · z∈[-418,-13]mm · 13 of 95 slices shown, 15 images]
[im 7/95  soft-tissue]
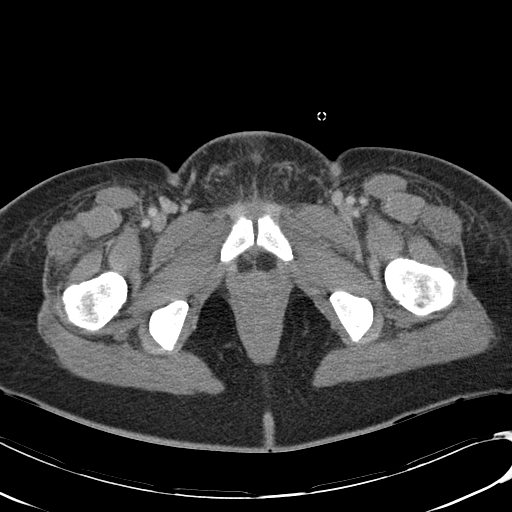
[im 7/95  bone]
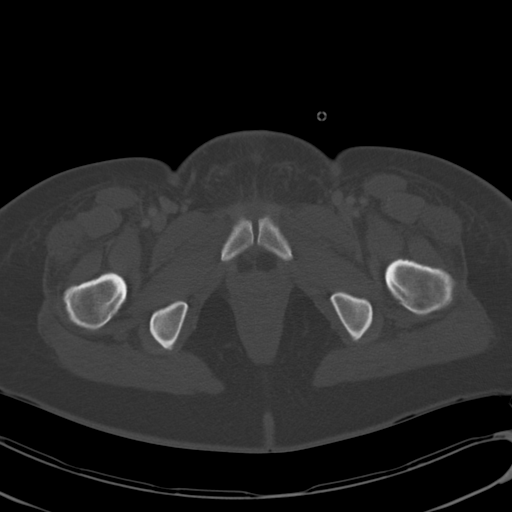
[im 14/95  soft-tissue]
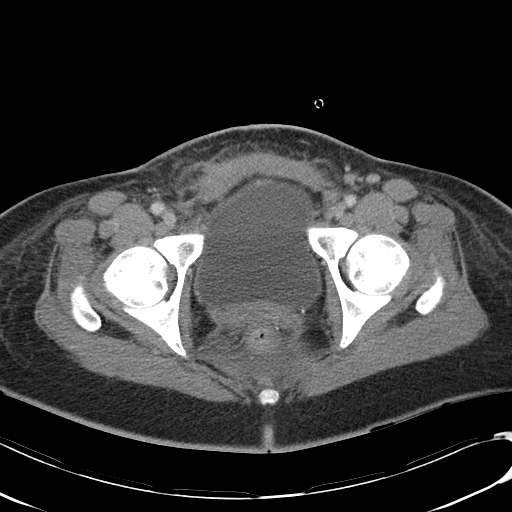
[im 21/95  soft-tissue]
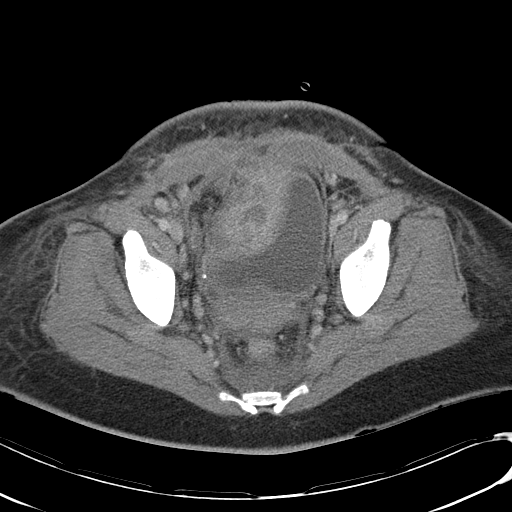
[im 27/95  soft-tissue]
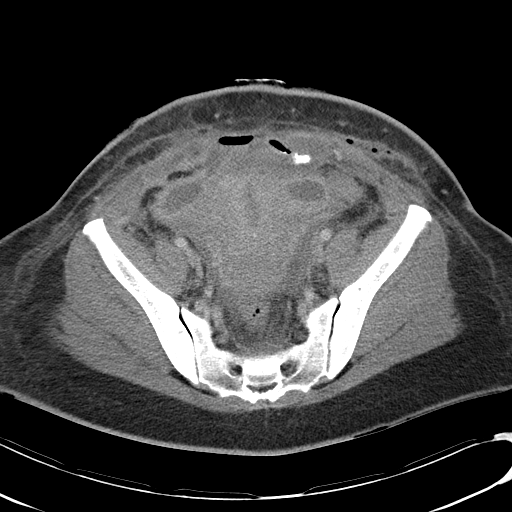
[im 34/95  soft-tissue]
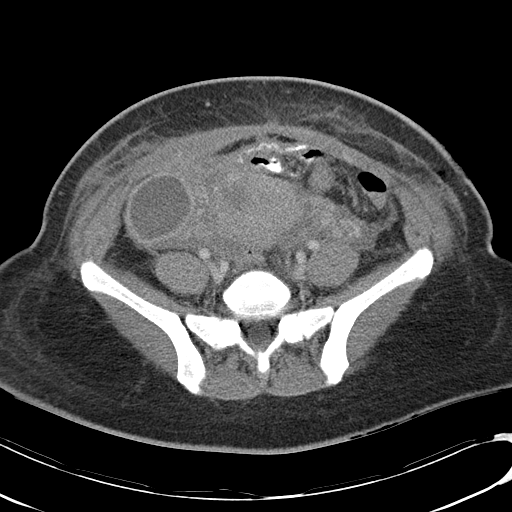
[im 41/95  soft-tissue]
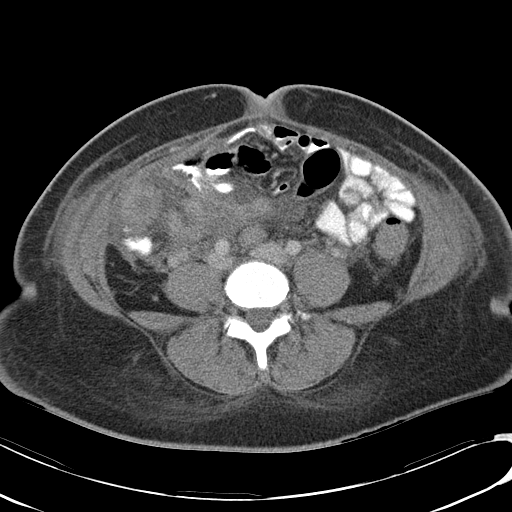
[im 48/95  soft-tissue]
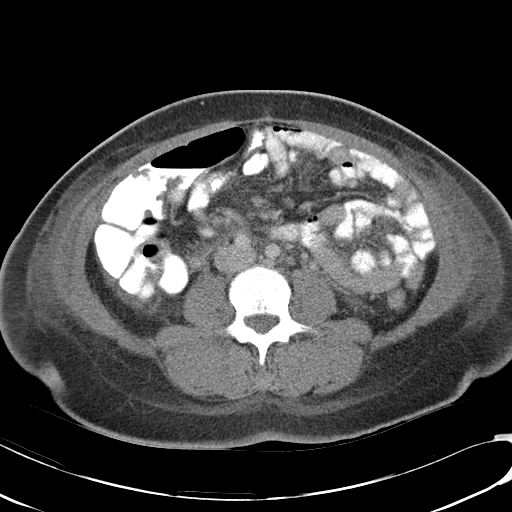
[im 54/95  soft-tissue]
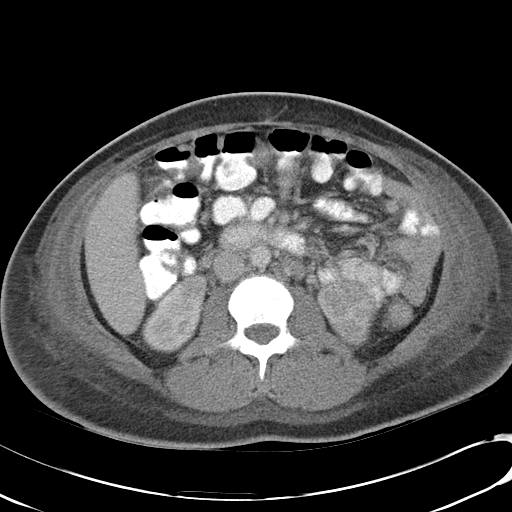
[im 61/95  soft-tissue]
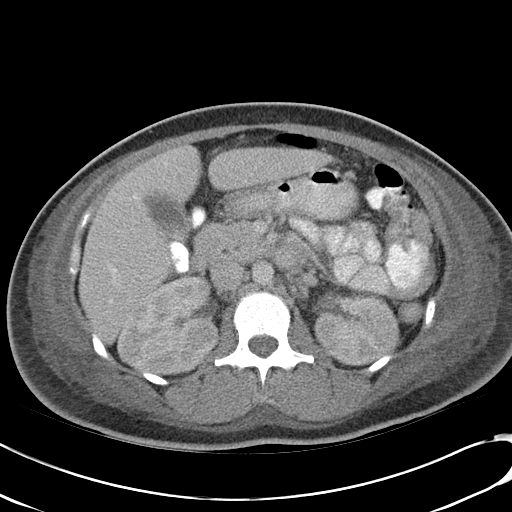
[im 61/95  bone]
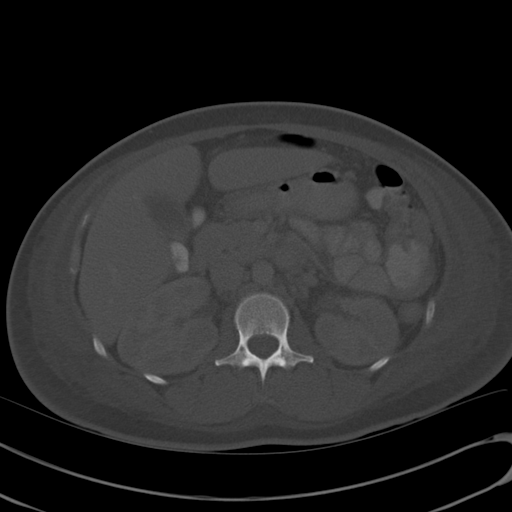
[im 68/95  soft-tissue]
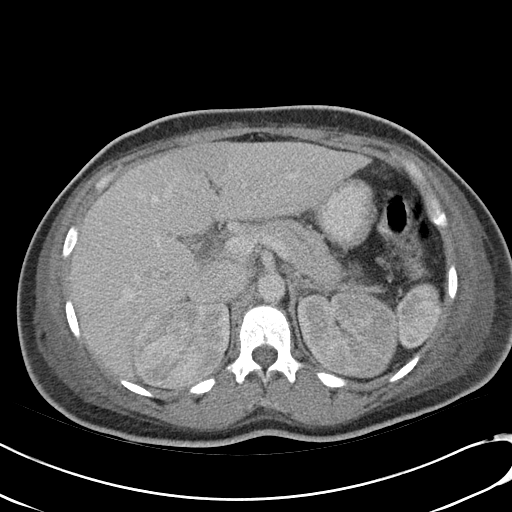
[im 74/95  soft-tissue]
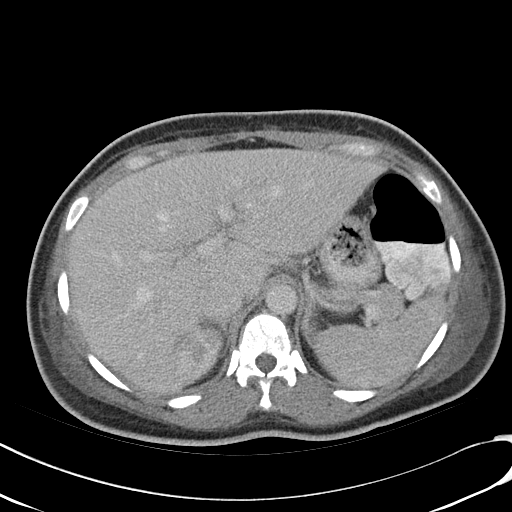
[im 81/95  soft-tissue]
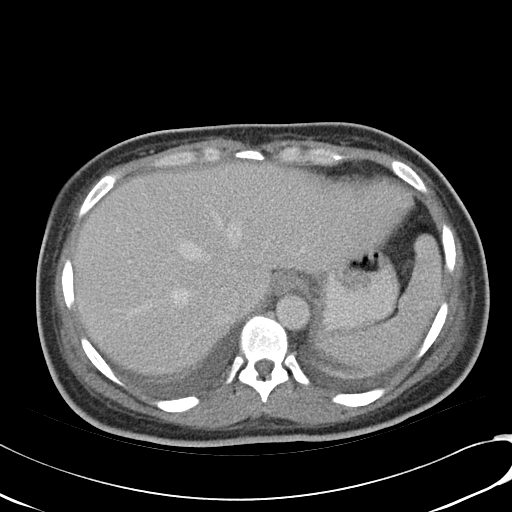
[im 88/95  soft-tissue]
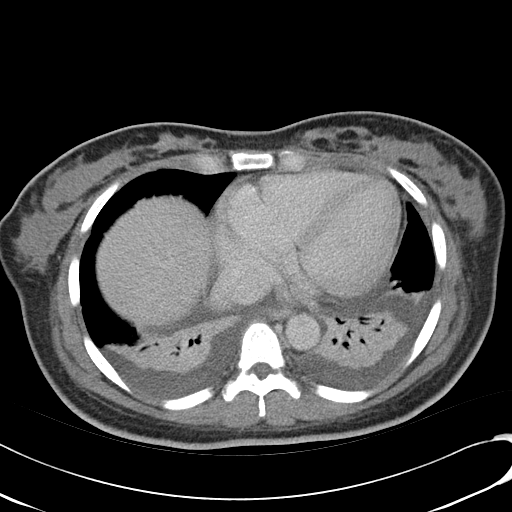

[Series 4: abd_pel_with 3.0 spo cor · coronal · 0.78mm/px · 3 of 75 slices shown]
[im 25/75  soft-tissue]
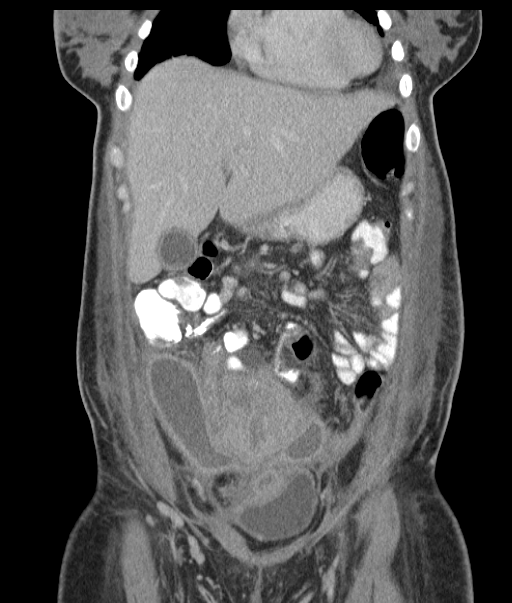
[im 33/75  soft-tissue]
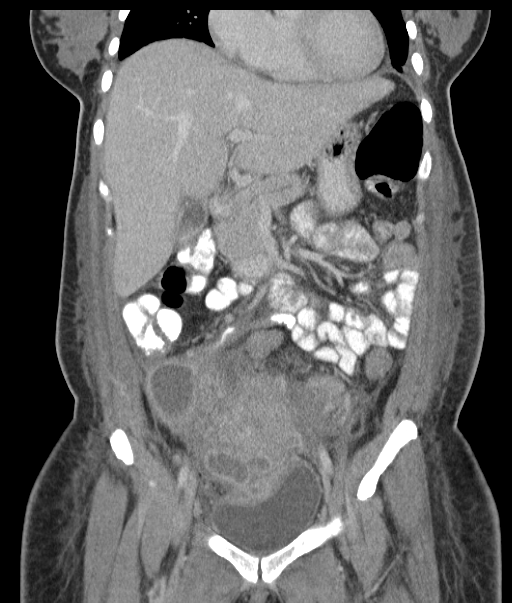
[im 42/75  soft-tissue]
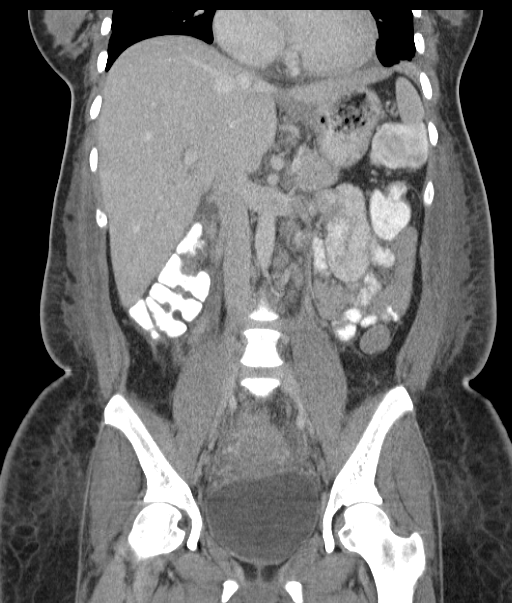

[16 of 46 positions shown; findings below may reference images not displayed]

FINDINGS: Pelvic findings:  There is interval increase in volume of intra
pelvic abscess.  There is a 5.2 x 4.1 cm abscess in the right iliac
fossa region (image 60).  This appears to communicate inferiorly
with a 5.9 x 3x2 cm abscess just superior to the bladder (image
72).  In the left abdomen adjacent to the uterus there is a smaller
2.6 x 1.4 cm abscess. On the coronal projection and these abscesses
all communicate in a "U" shape extending ventral and lateral from
the uterus and bladder (images 27 through 34 of series 4).

There is a focus of gas in the ventral abdomen at expected location
of the Francelona Pagalo measuring 26 mm x 9 mm (image 70).  There is
there is a smaller gas locules within the subcutaneous fat at this
same level extending to the left and right (image 66).

At this same level there is a collection of contrast in the ventral
peritoneal space which is felt to be contained within bowel and not
extraluminal contrast.

There are bilateral pleural effusions and dense bibasilar
atelectasis versus less likely pneumonia.  No focal hepatic lesion.
Gallstones in the gallbladder and small amount pericholecystic
fluid.  Pancreas is normal.  Adrenal glands are normal.  There is a
question of heterogeneous perfusion of the kidneys  but they
excrete normally on the delayed series.

The stomach, small bowel and colon grossly normal.  Abdominal aorta
normal caliber. The uterus and bladder unchanged.  No significant
osseous abnormality.]
IMPRESSION: 1..  Enlargement of pelvic abscess surrounding the uterus.
2.  Small loculated gas within the ventral abdominal wall and
subcutaneous tissues.
3.  Dense bibasilar atelectasis and pleural effusion which is
increased compared to prior.

4.  Poor perfusion of the kidneys initially with normal excretion.
Cannot complete exclude pyelonephritis although felt less likely as
this is a symmetric process
5.  Cholelithiasis without clear evidence of cholecystitis.

Findings discussed with Dr. Nils Fredrik on  01/10/2012 at  [DATE]
hours.

## 2014-09-20 ENCOUNTER — Ambulatory Visit (INDEPENDENT_AMBULATORY_CARE_PROVIDER_SITE_OTHER): Payer: BC Managed Care – PPO | Admitting: Adult Health

## 2014-09-20 ENCOUNTER — Encounter: Payer: Self-pay | Admitting: Adult Health

## 2014-09-20 VITALS — BP 120/70 | HR 72 | Ht 62.0 in | Wt 191.3 lb

## 2014-09-20 DIAGNOSIS — Z01419 Encounter for gynecological examination (general) (routine) without abnormal findings: Secondary | ICD-10-CM | POA: Diagnosis not present

## 2014-09-20 NOTE — Patient Instructions (Signed)
Physical in  1 year Mammogram at 40 

## 2014-09-20 NOTE — Progress Notes (Signed)
Patient ID: Kara Gentry, female   DOB: [birth year], 39 y.o.   MRN: [id number] History of Present Illness:  Kara Gentry is a 39 year old black female, married in for well woman gyn exam,she had normal pap with negative HPV 09/11/13.  Current Medications, Allergies, Past Medical History, Past Surgical History, Family History and Social History were reviewed in [employer] record.     Review of Systems:  Patient denies any headaches, hearing loss, fatigue, blurred vision, shortness of breath, chest pain, abdominal pain, problems with bowel movements, urination, or intercourse. No joint pain or mood swings.Has hemorrhoids on and off.She is thinking about having another child, she had twins a 23 weeks and they pasted away, and then she had son by surrogate and now is thinking of having surrogate again or trying IVF.She has had labs recently.   Physical Exam:BP 120/70 mmHg  Pulse 72  Ht 5\' 2"  (1.575 m)  Wt 191 lb 4.8 oz (86.773 kg)  BMI 34.98 kg/m2  LMP 09/12/2014 General:  Well developed, well nourished, no acute distress Skin:  Warm and dry Neck:  Midline trachea, normal thyroid, good ROM, no lymphadenopathy Lungs; Clear to auscultation bilaterally Breast:  No dominant palpable mass, retraction, or nipple discharge Cardiovascular: Regular rate and rhythm Abdomen:  Soft, non tender, no hepatosplenomegaly, has numerous incisional scars where had post op infections. Pelvic:  External genitalia is normal in appearance, no lesions.  The vagina is normal in appearance. Urethra has no lesions or masses. The cervix is smooth.  Uterus is felt to be normal size, shape, and contour.  No adnexal masses or tenderness noted.Bladder is non tender, no masses felt. Extremities/musculoskeletal:  No swelling or varicosities noted, no clubbing or cyanosis Psych:  No mood changes, alert and cooperative,seems happy   Impression: Well woman gyn exam no pap    Plan: Use preparation H or  anusol for hemorrhoids Physical in 1 year Mammogram at 40 Pap in 2018

## 2015-04-11 ENCOUNTER — Ambulatory Visit: Payer: BC Managed Care – PPO | Admitting: Obstetrics and Gynecology

## 2015-04-20 ENCOUNTER — Ambulatory Visit (INDEPENDENT_AMBULATORY_CARE_PROVIDER_SITE_OTHER): Payer: BC Managed Care – PPO | Admitting: Obstetrics and Gynecology

## 2015-04-20 ENCOUNTER — Encounter: Payer: Self-pay | Admitting: Obstetrics and Gynecology

## 2015-04-20 VITALS — BP 122/76 | Ht 63.0 in

## 2015-04-20 DIAGNOSIS — N939 Abnormal uterine and vaginal bleeding, unspecified: Secondary | ICD-10-CM

## 2015-04-20 NOTE — Progress Notes (Signed)
Patient ID: Kara Gentry, female   DOB: 02-05-1976, 40 y.o.   MRN: [id number]   [facility] Visit  Patient name: Kara Gentry MRN [id number]  Date of birth: [birth year]  CC & HPI:  Kara Gentry is a 40 y.o. female presenting today for chronic irregular menstrual cycles. Pt notes that intermittently she has two separate cycles in one month; for instance, pt had two periods in January and one period in February. Pt's LMP ended on 04/14/15. Associated Sx include worsening abd cramping; pt notes that she was provided a Rx for menstrual cramping, however, the abd cramping has not worsened to the point where she has to use the Rx meds. Pt reports many years ago she used oral BC pills which made her menstrual cycles regular and her monthly cycle consisted of very light periods.   ROS:  History significant for NECROTIZING FASCIITIS AFTER CESAREAN AT 25 WK FOR TWINS.  Pertinent History Reviewed:   Reviewed: Significant for ectopic pregnancy surgery Medical         Past Medical History  Diagnosis Date  . Seasonal allergies   . No pertinent past medical history   . PONV (postoperative nausea and vomiting)   . Hemorrhoids                               Surgical Hx:    Past Surgical History  Procedure Laterality Date  . Ectopic pregnancy surgery    . Wrist surgery    . Mandible surgery  cancer  . Nasal sinus surgery    . Cesarean section  12/22/2011    Procedure: CESAREAN SECTION;  Surgeon: Donnamae Jude, MD;  Location: [facility];  Service: Obstetrics;  Laterality: N/A;  Primary cesarean section with delivery of Baby A girl at 33. Baby B girl at 89.  Marland Kitchen Wound exploration  01/03/2012    Procedure: WOUND EXPLORATION;  Surgeon: Jonnie Kind, MD;  Location: [facility];  Service: Gynecology;  Laterality: N/A;  . Application of wound vac  01/03/2012    Procedure: APPLICATION OF WOUND VAC;  Surgeon: Jonnie Kind, MD;  Location: [facility];  Service: Gynecology;  Laterality: N/A;  . Insertion  of mesh  01/03/2012    Procedure: INSERTION OF MESH;  Surgeon: Jonnie Kind, MD;  Location: [facility];  Service: Gynecology;  Laterality: N/A;  . Wound exploration  01/05/2012    Procedure: WOUND EXPLORATION;  Surgeon: Jonnie Kind, MD;  Location: [facility];  Service: Gynecology;  Laterality: N/A;  Re-Exploration of Wound Abscess with Debridement  . Application of wound vac  01/05/2012    Procedure: APPLICATION OF WOUND VAC;  Surgeon: Jonnie Kind, MD;  Location: [facility];  Service: Gynecology;  Laterality: N/A;  . Wound exploration  01/07/2012    Procedure: WOUND EXPLORATION;  Surgeon: Jonnie Kind, MD;  Location: [facility];  Service: Gynecology;;  . Incision and drainage of wound  01/07/2012    Procedure: IRRIGATION AND DEBRIDEMENT WOUND;  Surgeon: Jonnie Kind, MD;  Location: [facility];  Service: Gynecology;;  . Application of wound vac  01/07/2012    Procedure: APPLICATION OF WOUND VAC;  Surgeon: Jonnie Kind, MD;  Location: [facility];  Service: Gynecology;;  . Wound exploration  01/11/2012    Procedure: WOUND EXPLORATION;  Surgeon: Jonnie Kind, MD;  Location: [facility];  Service: Gynecology;  Laterality: N/A;  Debridment of Wound  Abscess  . Application of wound vac  01/11/2012    Procedure: APPLICATION OF WOUND VAC;  Surgeon: Jonnie Kind, MD;  Location: [facility];  Service: Gynecology;  Laterality: N/A;  Fasicial Closure  . Wound exploration  01/22/2012    Procedure: WOUND EXPLORATION;  Surgeon: Jonnie Kind, MD;  Location: [facility];  Service: Gynecology;  Laterality: N/A;  Delayed Secondary Wound Closure  . Secondary closure of wound  01/22/2012    Procedure: SECONDARY CLOSURE OF WOUND;  Surgeon: Jonnie Kind, MD;  Location: [facility];  Service: Gynecology;  Laterality: N/A;   Medications: Reviewed & Updated - see associated section                       Current outpatient prescriptions:  .  acetaminophen (TYLENOL) 325 MG tablet, Take 650 mg by mouth as needed., Disp: , Rfl:  .   aspirin-acetaminophen-caffeine (EXCEDRIN MIGRAINE) 250-250-65 MG tablet, Take 1 tablet by mouth as needed for headache., Disp: , Rfl:    Social History: Reviewed -  reports that she has never smoked. She has never used smokeless tobacco.  Objective Findings:  Vitals: Blood pressure 122/76, height 5\' 3"  (1.6 m), last menstrual period 04/14/2015.  Physical Examination: Discussion only. Discussed with pt risks and benefits of OCP use to combat irregular periods. At end of discussion, pt had opportunity to ask questions and has no further questions at this time. Greater than 50% was spent in counseling and coordination of care with the patient. Total time greater than: 10 minutes  Assessment & Plan:   A:  1. Abnormal uterine bleeding.   P:  1. OCP.       By signing my name below, I, Terressa Koyanagi, attest that this documentation has been prepared under the direction and in the presence of Mallory Shirk, MD. Electronically Signed: Terressa Koyanagi, ED Scribe. 04/20/2015. 4:30 PM.   I personally performed the services described in this documentation, which was SCRIBED in my presence. The recorded information has been reviewed and considered accurate. It has been edited as necessary during review. Jonnie Kind, MD

## 2015-08-02 ENCOUNTER — Telehealth: Payer: Self-pay | Admitting: Obstetrics and Gynecology

## 2015-08-02 NOTE — Telephone Encounter (Signed)
Pt states that she is not sure what the name of the medication was that Dr. Glo Herring was going to send in to her pharmacy. Pt states that she spoke with Dr. Glo Herring on the phone about the issues she was having.   I advised the pt that I reviewed her chart and there was no name of the BCP that he was going to give her. I advised the pt that he would be in there office this afternoon and I would discuss this with him and see if he remembers what he was going to give her. Pt verbalized understanding.

## 2015-08-18 ENCOUNTER — Telehealth: Payer: Self-pay | Admitting: *Deleted

## 2015-08-19 MED ORDER — NORETHIN ACE-ETH ESTRAD-FE 1-20 MG-MCG(24) PO TABS: 1.0000 | ORAL_TABLET | Freq: Every day | ORAL | Status: DC

## 2015-08-19 NOTE — Telephone Encounter (Signed)
LoLoestrin Fe 24 to be called in.(20 mcg pill)generic

## 2015-10-24 ENCOUNTER — Encounter (HOSPITAL_COMMUNITY): Payer: Self-pay | Admitting: Emergency Medicine

## 2015-10-24 ENCOUNTER — Emergency Department (HOSPITAL_COMMUNITY)
Admission: EM | Admit: 2015-10-24 | Discharge: 2015-10-24 | Disposition: A | Discharge status: Home / Self Care | Payer: BC Managed Care – PPO | Attending: Emergency Medicine | Admitting: Emergency Medicine

## 2015-10-24 DIAGNOSIS — Z7982 Long term (current) use of aspirin: Secondary | ICD-10-CM | POA: Insufficient documentation

## 2015-10-24 DIAGNOSIS — Z79899 Other long term (current) drug therapy: Secondary | ICD-10-CM | POA: Diagnosis not present

## 2015-10-24 DIAGNOSIS — J069 Acute upper respiratory infection, unspecified: Secondary | ICD-10-CM | POA: Diagnosis not present

## 2015-10-24 DIAGNOSIS — R52 Pain, unspecified: Secondary | ICD-10-CM

## 2015-10-24 DIAGNOSIS — M791 Myalgia: Secondary | ICD-10-CM | POA: Diagnosis present

## 2015-10-24 NOTE — ED Triage Notes (Signed)
Pt reports generalized body aches and cough/congestion since Friday.  Has been taking Nyquil.  States she feels better today than she has since Friday.

## 2015-10-24 NOTE — ED Provider Notes (Signed)
[facility] DEPT Provider Note   CSN: [id number] Arrival date & time: 10/24/15  1024  By signing my name below, I, Shanna Cisco, attest that this documentation has been prepared under the direction and in the presence of Lily Kocher, PA-C. Electronically signed by: Shanna Cisco, ED Scribe. 10/24/15. 11:39 AM.  History   Chief Complaint Chief Complaint  Patient presents with  . Generalized Body Aches   The history is provided by the patient. No language interpreter was used.   HPI Comments:  Kara Gentry is a 40 y.o. female who presents to the Emergency Department complaining of generalized myalgia, which started 3 days ago. Associated symptoms include neck stiffness, diaphoresis, chills, subjective fever, cough and nasal congestion. Pt has been taking Nyquil, which has provided relief. Symptoms have improved since onset. Denies vomiting, diarrhea or rash. Possible exposure to illness from son and co-workers.  Past Medical History:  Diagnosis Date  . Hemorrhoids   . No pertinent past medical history   . PONV (postoperative nausea and vomiting)   . Seasonal allergies     Patient Active Problem List   Diagnosis Date Noted  . Necrotizing fasciitis ([place]) 01/03/2012  . Postoperative wound abscess 01/02/2012    Past Surgical History:  Procedure Laterality Date  . APPLICATION OF WOUND VAC  01/03/2012   Procedure: APPLICATION OF WOUND VAC;  Surgeon: Jonnie Kind, MD;  Location: [facility];  Service: Gynecology;  Laterality: N/A;  . APPLICATION OF WOUND VAC  01/05/2012   Procedure: APPLICATION OF WOUND VAC;  Surgeon: Jonnie Kind, MD;  Location: [facility];  Service: Gynecology;  Laterality: N/A;  . APPLICATION OF WOUND VAC  01/07/2012   Procedure: APPLICATION OF WOUND VAC;  Surgeon: Jonnie Kind, MD;  Location: [facility];  Service: Gynecology;;  . APPLICATION OF WOUND VAC  01/11/2012   Procedure: APPLICATION OF WOUND VAC;  Surgeon: Jonnie Kind, MD;  Location: [facility];  Service:  Gynecology;  Laterality: N/A;  Fasicial Closure  . CESAREAN SECTION  12/22/2011   Procedure: CESAREAN SECTION;  Surgeon: Donnamae Jude, MD;  Location: [facility];  Service: Obstetrics;  Laterality: N/A;  Primary cesarean section with delivery of Baby A girl at 28. Baby B girl at 7.  Marland Kitchen ECTOPIC PREGNANCY SURGERY    . INCISION AND DRAINAGE OF WOUND  01/07/2012   Procedure: IRRIGATION AND DEBRIDEMENT WOUND;  Surgeon: Jonnie Kind, MD;  Location: [facility];  Service: Gynecology;;  . INSERTION OF MESH  01/03/2012   Procedure: INSERTION OF MESH;  Surgeon: Jonnie Kind, MD;  Location: [facility];  Service: Gynecology;  Laterality: N/A;  . MANDIBLE SURGERY  cancer  . NASAL SINUS SURGERY    . SECONDARY CLOSURE OF WOUND  01/22/2012   Procedure: SECONDARY CLOSURE OF WOUND;  Surgeon: Jonnie Kind, MD;  Location: [facility];  Service: Gynecology;  Laterality: N/A;  . WOUND EXPLORATION  01/03/2012   Procedure: WOUND EXPLORATION;  Surgeon: Jonnie Kind, MD;  [facility]: [facility];  Service: Gynecology;  Laterality: N/A;  . WOUND EXPLORATION  01/05/2012   Procedure: WOUND EXPLORATION;  Surgeon: Jonnie Kind, MD;  [facility]: [facility];  Service: Gynecology;  Laterality: N/A;  Re-Exploration of Wound Abscess with Debridement  . WOUND EXPLORATION  01/07/2012   Procedure: WOUND EXPLORATION;  Surgeon: Jonnie Kind, MD;  [facility];  Service: Gynecology;;  . WOUND EXPLORATION  01/11/2012   Procedure: WOUND EXPLORATION;  Surgeon: Jonnie Kind, MD;  [facility]: [facility];  Service: Gynecology;  Laterality: N/A;  Debridment of Wound Abscess  . WOUND EXPLORATION  01/22/2012   Procedure: WOUND EXPLORATION;  Surgeon: Jonnie Kind, MD;  Location: [facility];  Service: Gynecology;  Laterality: N/A;  Delayed Secondary Wound Closure  . WRIST SURGERY      OB History    Gravida Para Term Preterm AB Living   3 1   1 2 2    SAB TAB Ectopic Multiple Live Births   2     1 2        Home Medications    Prior to Admission  medications   Medication Sig Start Date End Date Taking? Authorizing Provider  acetaminophen (TYLENOL) 325 MG tablet Take 650 mg by mouth as needed.    Historical Provider, MD  aspirin-acetaminophen-caffeine (EXCEDRIN MIGRAINE) [phone] MG tablet Take 1 tablet by mouth as needed for headache.    Historical Provider, MD  Norethindrone Acetate-Ethinyl Estrad-FE (LOESTRIN 24 FE) 1-20 MG-MCG(24) tablet Take 1 tablet by mouth daily. 08/19/15   Jonnie Kind, MD    Family History Family History  Problem Relation Age of Onset  . Cancer Maternal Grandfather     prostate  . Diabetes Mother   . Arthritis Mother   . Cancer Paternal Uncle   . Cancer Maternal Uncle     lung  . Diabetes Maternal Aunt   . Arthritis Maternal Aunt     Social History Social History  Substance Use Topics  . Smoking status: Never Smoker  . Smokeless tobacco: Never Used  . Alcohol use No     Allergies   Review of patient's allergies indicates no known allergies.   Review of Systems Review of Systems  Constitutional: Positive for chills and diaphoresis.  HENT: Positive for congestion.   Respiratory: Positive for cough.   Gastrointestinal: Negative for diarrhea and vomiting.  Musculoskeletal: Positive for myalgias.  Skin: Negative for rash.  All other systems reviewed and are negative.    Physical Exam Updated Vital Signs BP 121/83 (BP Location: Left Arm)   Pulse 78   Temp 97.7 F (36.5 C) (Temporal)   Resp 16   Ht 5\' 2"  (1.575 m)   Wt 190 lb (86.2 kg)   LMP 10/03/2015   SpO2 100%   BMI 34.75 kg/m   Physical Exam  Constitutional: She is oriented to person, place, and time. She appears well-developed and well-nourished.  HENT:  Head: Normocephalic and atraumatic.  Mouth/Throat: Oropharynx is clear and moist.  No increased warmth over sinus areas. Uvula moderately enlarged but midline. Airways patent.   Eyes: Conjunctivae and EOM are normal. Pupils are equal, round, and reactive to  light.  Neck: Normal range of motion.  No rigidity.  Cardiovascular: Normal rate, regular rhythm and normal heart sounds.   Pulses:      Radial pulses are 2+ on the right side, and 2+ on the left side.       Dorsalis pedis pulses are 2+ on the right side, and 2+ on the left side.  Pulmonary/Chest: Effort normal and breath sounds normal.  Abdominal: Soft. Bowel sounds are normal. She exhibits no mass.  No organomegaly.   Musculoskeletal: Normal range of motion. She exhibits no edema.  Lymphadenopathy:    She has cervical adenopathy.  Neurological: She is alert and oriented to person, place, and time.  Skin: Skin is warm and dry. Capillary refill takes less than 2 seconds.  Psychiatric: She has a normal mood and affect.  Nursing note  and vitals reviewed.    ED Treatments / Results  DIAGNOSTIC STUDIES:  Oxygen Saturation is 100% on room air, normal by my interpretation.    COORDINATION OF CARE:  11:35 AM Discussed treatment plan with pt at bedside, which includes Tylenol or Ibuprofen, and pt agreed to plan. Advised to increase fluid intake. Pt should return to ED or follow up with PCP if sxs worsen.  Labs (all labs ordered are listed, but only abnormal results are displayed) Labs Reviewed - No data to display  EKG  EKG Interpretation None       Radiology No results found.  Procedures Procedures (including critical care time)  Medications Ordered in ED Medications - No data to display   Initial Impression / Assessment and Plan / ED Course  I have reviewed the triage vital signs and the nursing notes.  Pertinent labs & imaging results that were available during my care of the patient were reviewed by me and considered in my medical decision making (see chart for details).  Clinical Course    **I have reviewed nursing notes, vital signs, and all appropriate lab and imaging results for this patient.*  Final Clinical Impressions(s) / ED Diagnoses  Vital signs are  within normal limits. Patient states she feels better today than she has on previous occasions. Suspect the patient has upper respiratory infection. Patient to increase fluids, use Tylenol or ibuprofen, may continue to use the over-the-counter NyQuil. The patient will return to the emergency department or see her primary physician if not improving.    Final diagnoses:  None    New Prescriptions New Prescriptions   No medications on file  **I personally performed the services described in this documentation, which was scribed in my presence. The recorded information has been reviewed and is accurate.Lily Kocher, PA-C 10/24/15 [place], MD 10/27/15 [phone]

## 2015-10-24 NOTE — ED Notes (Signed)
Pt reports having a sore throat early last week, took Nyquil for discomfort. States she started having chills, fever, and generalized body aches Friday. Denies productive cough, SOB, CP, facial tenderness. Neck is tender upon palpation.

## 2015-10-24 NOTE — Discharge Instructions (Signed)
Your vital signs within normal limits. Your oxygen level is 100% on room air. Please increase fluids. Please use ibuprofen 600 mg every 6 hours for body aches and fever/chills. Please wash hands frequently, and keep your distance from others until this has resolved. Please see Dr. Luan Pulling, or return to the emergency department if symptoms worsen.

## 2015-11-08 ENCOUNTER — Telehealth: Payer: Self-pay | Admitting: Obstetrics and Gynecology

## 2015-11-08 NOTE — Telephone Encounter (Signed)
Pt called stating that Dr. Glo Herring has placed her on a birth control that was suppose to stop her period, it was working at first and now she has taken the medication and 2 weeks later her period started. She would like to speak with a nurse regarding this. Please contact pt

## 2015-11-09 NOTE — Telephone Encounter (Signed)
Pt states she is taking Loloestrin was not taking the "sugar pills" so she would not have a period. Pt states started her period on 11/06/2014, has been taking the white pills for the second week of white pills. Pt does not want to have a period, please advise.

## 2015-11-09 NOTE — Telephone Encounter (Signed)
Left message that will have some breakthrough bleeding at times taking them this way,

## 2015-11-15 ENCOUNTER — Telehealth: Payer: Self-pay | Admitting: *Deleted

## 2015-11-15 NOTE — Telephone Encounter (Signed)
Had BTB and cramps, do not take inert pills, and give it 3 months if not better then see Dr Glo Herring again

## 2015-11-15 NOTE — Telephone Encounter (Signed)
Spoke with pt. Pt was put on birth control pills to stop bleeding. Pt is having breakthrough bleeding. Dr. Carmell Austria advised not to take sugar pills and go directly to a new pack. Her sugar pills are coming up next week. She has already had a cycle. Please advise. Should she take sugar pills next week? Thanks!! Rodriguez Camp

## 2016-03-06 ENCOUNTER — Ambulatory Visit (HOSPITAL_COMMUNITY)
Admission: RE | Admit: 2016-03-06 | Discharge: 2016-03-06 | Disposition: A | Discharge status: Home / Self Care | Payer: BC Managed Care – PPO | Source: Ambulatory Visit | Attending: Pulmonary Disease | Admitting: Pulmonary Disease

## 2016-03-06 ENCOUNTER — Other Ambulatory Visit (HOSPITAL_COMMUNITY): Payer: Self-pay | Admitting: Pulmonary Disease

## 2016-03-06 DIAGNOSIS — M4184 Other forms of scoliosis, thoracic region: Secondary | ICD-10-CM | POA: Insufficient documentation

## 2016-03-06 DIAGNOSIS — M25512 Pain in left shoulder: Secondary | ICD-10-CM

## 2016-05-10 ENCOUNTER — Other Ambulatory Visit: Payer: BC Managed Care – PPO | Admitting: Adult Health

## 2016-05-21 ENCOUNTER — Encounter: Payer: Self-pay | Admitting: Adult Health

## 2016-05-21 ENCOUNTER — Ambulatory Visit (INDEPENDENT_AMBULATORY_CARE_PROVIDER_SITE_OTHER): Payer: BC Managed Care – PPO | Admitting: Adult Health

## 2016-05-21 ENCOUNTER — Other Ambulatory Visit (HOSPITAL_COMMUNITY)
Admission: RE | Admit: 2016-05-21 | Discharge: 2016-05-21 | Disposition: A | Discharge status: Home / Self Care | Payer: BC Managed Care – PPO | Source: Ambulatory Visit | Attending: Adult Health | Admitting: Adult Health

## 2016-05-21 VITALS — BP 110/70 | HR 70 | Ht 63.0 in | Wt 183.0 lb

## 2016-05-21 DIAGNOSIS — R102 Pelvic and perineal pain: Secondary | ICD-10-CM | POA: Insufficient documentation

## 2016-05-21 DIAGNOSIS — Z1211 Encounter for screening for malignant neoplasm of colon: Secondary | ICD-10-CM

## 2016-05-21 DIAGNOSIS — Z131 Encounter for screening for diabetes mellitus: Secondary | ICD-10-CM

## 2016-05-21 DIAGNOSIS — Z7689 Persons encountering health services in other specified circumstances: Secondary | ICD-10-CM | POA: Insufficient documentation

## 2016-05-21 DIAGNOSIS — Z1151 Encounter for screening for human papillomavirus (HPV): Secondary | ICD-10-CM | POA: Insufficient documentation

## 2016-05-21 DIAGNOSIS — Z01419 Encounter for gynecological examination (general) (routine) without abnormal findings: Secondary | ICD-10-CM | POA: Diagnosis present

## 2016-05-21 DIAGNOSIS — Z1212 Encounter for screening for malignant neoplasm of rectum: Secondary | ICD-10-CM

## 2016-05-21 DIAGNOSIS — Z13220 Encounter for screening for lipoid disorders: Secondary | ICD-10-CM

## 2016-05-21 LAB — HEMOCCULT GUIAC POC 1CARD (OFFICE): Fecal Occult Blood, POC: NEGATIVE

## 2016-05-21 MED ORDER — NORETHIN ACE-ETH ESTRAD-FE 1-20 MG-MCG(24) PO TABS: 1.0000 | ORAL_TABLET | Freq: Every day | ORAL | 11 refills | Status: DC

## 2016-05-21 NOTE — Progress Notes (Signed)
Patient ID: Kara Gentry, female   DOB: November 14, 1975, 41 y.o.   MRN: [id number] History of Present Illness: Kara Gentry is a 41 year old black female, married in for well woman gyn exam and pap.She is having breast pain on and off and stopped caffeine in January and pelvic pain at times. PCP is Dr Luan Pulling   Current Medications, Allergies, Past Medical History, Past Surgical History, Family History and Social History were reviewed in [facility] record.     Review of Systems: Patient denies any headaches, hearing loss, fatigue, blurred vision, shortness of breath, chest pain, problems with bowel movements, urination, or intercourse. No joint pain or mood swings.Breasts sore on and off, and has pelvic pain at times, is on loestrin for period regulation.    Physical Exam:BP 110/70 (BP Location: Left Arm, Patient Position: Sitting, Cuff Size: Normal)   Pulse 70   Ht 5\' 3"  (1.6 m)   Wt 183 lb (83 kg)   LMP 04/30/2016 (Approximate)   BMI 32.42 kg/m  General:  Well developed, well nourished, no acute distress Skin:  Warm and dry Neck:  Midline trachea, normal thyroid, good ROM, no lymphadenopathy Lungs; Clear to auscultation bilaterally Breast:  No dominant palpable mass, retraction, or nipple discharge Cardiovascular: Regular rate and rhythm Abdomen:  Soft, non tender, no hepatosplenomegaly,healed scars Pelvic:  External genitalia is normal in appearance, no lesions.  The vagina is normal in appearance. Urethra has no lesions or masses. The cervix is smooth, pap with HPV performed.  Uterus is felt to be normal size, shape, and contour.  No adnexal masses or tenderness noted.Bladder is non tender, no masses felt. Rectal: Good sphincter tone, no polyps, or hemorrhoids felt.  Hemoccult negative. Extremities/musculoskeletal:  No swelling or varicosities noted, no clubbing or cyanosis Psych:  No mood changes, alert and cooperative,seems happy PHQ 2 score 1.  Impression:  1.  Encounter for gynecological examination with Papanicolaou smear of cervix   2. Pelvic pain   3. Screening for colorectal cancer   4. Encounter for menstrual regulation   5. Screening for diabetes mellitus   6. Screening cholesterol level      Plan: Check CBC,CMP,TSH and lipids,A1c and vitamin D Return in 1 week for GYN [place] Physical in 1 year Pap in 3 if normal Mammogram now and yearly

## 2016-05-22 LAB — COMPREHENSIVE METABOLIC PANEL
ALT: 16 IU/L (ref 0–32)
AST: 13 IU/L (ref 0–40)
Albumin/Globulin Ratio: 1.4 (ref 1.2–2.2)
Albumin: 4.4 g/dL (ref 3.5–5.5)
Alkaline Phosphatase: 41 IU/L (ref 39–117)
BUN/Creatinine Ratio: 24 — ABNORMAL HIGH (ref 9–23)
BUN: 16 mg/dL (ref 6–24)
Bilirubin Total: 0.6 mg/dL (ref 0.0–1.2)
CO2: 22 mmol/L (ref 18–29)
Calcium: 9.1 mg/dL (ref 8.7–10.2)
Chloride: 98 mmol/L (ref 96–106)
Creatinine, Ser: 0.68 mg/dL (ref 0.57–1.00)
GFR calc Af Amer: 126 mL/min/{1.73_m2} (ref >59)
GFR calc non Af Amer: 109 mL/min/{1.73_m2} (ref >59)
Globulin, Total: 3.1 g/dL (ref 1.5–4.5)
Glucose: 86 mg/dL (ref 65–99)
Potassium: 4 mmol/L (ref 3.5–5.2)
Sodium: 137 mmol/L (ref 134–144)
Total Protein: 7.5 g/dL (ref 6.0–8.5)

## 2016-05-22 LAB — LIPID PANEL
Chol/HDL Ratio: 3.8 ratio (ref 0.0–4.4)
Cholesterol, Total: 199 mg/dL (ref 100–199)
HDL: 52 mg/dL (ref >39)
LDL Calculated: 129 mg/dL — ABNORMAL HIGH (ref 0–99)
Triglycerides: 89 mg/dL (ref 0–149)
VLDL Cholesterol Cal: 18 mg/dL (ref 5–40)

## 2016-05-22 LAB — CBC
Hematocrit: 38 % (ref 34.0–46.6)
Hemoglobin: 12.7 g/dL (ref 11.1–15.9)
MCH: 27.5 pg (ref 26.6–33.0)
MCHC: 33.4 g/dL (ref 31.5–35.7)
MCV: 82 fL (ref 79–97)
Platelets: 304 10*3/uL (ref 150–379)
RBC: 4.61 x10E6/uL (ref 3.77–5.28)
RDW: 14.3 % (ref 12.3–15.4)
WBC: 7.8 10*3/uL (ref 3.4–10.8)

## 2016-05-22 LAB — HEMOGLOBIN A1C
Est. average glucose Bld gHb Est-mCnc: 120 mg/dL
Hgb A1c MFr Bld: 5.8 % — ABNORMAL HIGH (ref 4.8–5.6)

## 2016-05-22 LAB — VITAMIN D 25 HYDROXY (VIT D DEFICIENCY, FRACTURES): Vit D, 25-Hydroxy: 10.9 ng/mL — ABNORMAL LOW (ref 30.0–100.0)

## 2016-05-22 LAB — TSH: TSH: 0.939 u[IU]/mL (ref 0.450–4.500)

## 2016-05-23 ENCOUNTER — Encounter: Payer: Self-pay | Admitting: Adult Health

## 2016-05-23 DIAGNOSIS — E559 Vitamin D deficiency, unspecified: Secondary | ICD-10-CM

## 2016-05-23 HISTORY — DX: Vitamin D deficiency, unspecified: E55.9

## 2016-05-24 LAB — CYTOLOGY - PAP
Diagnosis: NEGATIVE
HPV: NOT DETECTED

## 2016-05-28 ENCOUNTER — Other Ambulatory Visit: Payer: Self-pay | Admitting: Obstetrics and Gynecology

## 2016-05-28 ENCOUNTER — Ambulatory Visit (INDEPENDENT_AMBULATORY_CARE_PROVIDER_SITE_OTHER): Payer: BC Managed Care – PPO

## 2016-05-28 DIAGNOSIS — R102 Pelvic and perineal pain: Secondary | ICD-10-CM | POA: Diagnosis not present

## 2016-05-28 DIAGNOSIS — Z1231 Encounter for screening mammogram for malignant neoplasm of breast: Secondary | ICD-10-CM

## 2016-05-28 NOTE — Progress Notes (Signed)
PELVIC US TA/TV: heterogeneous uterus w/mult fibroids,(#1) pedunculated subserosal fibroid fundal right 3.4 x 3.2 x 3.7 cm,(#2) post submucosal 2.8 x 3 x 2.6 cm,(#3) intramural post left 1.8 x 1.8 x 1.5 cm, EEC 4.2 mm,endometrium appears to be distorted by submucosal fibroid,normal ovaries bilat,right ovary appears mobile, unable to move left ovary w/abdominal and probe pressure,no free fluid,mult small nabothian cysts

## 2016-05-31 ENCOUNTER — Encounter: Payer: Self-pay | Admitting: Adult Health

## 2016-05-31 ENCOUNTER — Telehealth: Payer: Self-pay | Admitting: Adult Health

## 2016-05-31 DIAGNOSIS — D250 Submucous leiomyoma of uterus: Secondary | ICD-10-CM

## 2016-05-31 DIAGNOSIS — D251 Intramural leiomyoma of uterus: Principal | ICD-10-CM

## 2016-05-31 DIAGNOSIS — D219 Benign neoplasm of connective and other soft tissue, unspecified: Secondary | ICD-10-CM | POA: Insufficient documentation

## 2016-05-31 HISTORY — DX: Benign neoplasm of connective and other soft tissue, unspecified: D21.9

## 2016-05-31 NOTE — Telephone Encounter (Signed)
Pt aware that US showed multiple fibroids and adhesions at left ovary, probably cause of pain, make appt to discuss with Dr Glo Herring.

## 2016-06-04 ENCOUNTER — Ambulatory Visit (HOSPITAL_COMMUNITY)
Admission: RE | Admit: 2016-06-04 | Discharge: 2016-06-04 | Disposition: A | Discharge status: Home / Self Care | Payer: BC Managed Care – PPO | Source: Ambulatory Visit | Attending: Obstetrics and Gynecology | Admitting: Obstetrics and Gynecology

## 2016-06-04 DIAGNOSIS — Z1231 Encounter for screening mammogram for malignant neoplasm of breast: Secondary | ICD-10-CM

## 2016-07-02 ENCOUNTER — Ambulatory Visit: Payer: BC Managed Care – PPO | Admitting: Obstetrics and Gynecology

## 2016-07-03 ENCOUNTER — Ambulatory Visit (INDEPENDENT_AMBULATORY_CARE_PROVIDER_SITE_OTHER): Payer: BC Managed Care – PPO | Admitting: Obstetrics and Gynecology

## 2016-07-03 ENCOUNTER — Encounter: Payer: Self-pay | Admitting: Obstetrics and Gynecology

## 2016-07-03 VITALS — BP 124/80 | HR 80 | Wt 184.0 lb

## 2016-07-03 DIAGNOSIS — D250 Submucous leiomyoma of uterus: Secondary | ICD-10-CM

## 2016-07-08 NOTE — Progress Notes (Signed)
[facility] Visit  @DATE @            Patient name: Kara Gentry MRN [id number]  Date of birth: 13-Apr-1975  CC & HPI:  Kara Gentry is a 41 y.o. female presenting today for Evaluation of pelvic pain and review of ultrasound. [id number]  ROS:  ROS   Pertinent History Reviewed:   Reviewed: Significant for I reviewed to make this ultrasound. The uterus measures 9.7 x 6 x 6.8 cm somewhat thickened with multiple fibroids including a submucosal posterior fibroid 3 x 2.6 x 2.8 cm that seems to deform the endometrial cavity slightly into other fibroids one of which is pedunculated and not considered clinically significant the right ovary appears adherent to the back wall of the uterus but does not seem attached to the bowel. The left ovary is unable to move either with respect to the uterine side of things or the bowel so the left ovary may be stuck quite significantly both to the bowel and the back of the uterus  Clinical history is significant in that the patient in 2013 had necrotizing fasciitis after a cesarean section for extremely premature twins after premature preterm rupture membranes for several weeks. She required several debridements of the anterior abdominal wall and is certainly reasonably expected to have pelvic adhesions as well as the anterior abdominal wall difficulties and residual scar tissue Medical         Past Medical History:  Diagnosis Date  . Fibroids 05/31/2016  . Hemorrhoids   . No pertinent past medical history   . PONV (postoperative nausea and vomiting)   . Seasonal allergies   . Vitamin D deficiency 05/23/2016   Take 5000 IU vitamin D3 daily                              Surgical Hx:    Past Surgical History:  Procedure Laterality Date  . APPLICATION OF WOUND VAC  01/03/2012   Procedure: APPLICATION OF WOUND VAC;  Surgeon: Jonnie Kind, MD;  Location: [facility];  Service: Gynecology;  Laterality: N/A;  . APPLICATION OF WOUND VAC  01/05/2012    Procedure: APPLICATION OF WOUND VAC;  Surgeon: Jonnie Kind, MD;  [facility]: [facility];  Service: Gynecology;  Laterality: N/A;  . APPLICATION OF WOUND VAC  01/07/2012   Procedure: APPLICATION OF WOUND VAC;  Surgeon: Jonnie Kind, MD;  [facility]: [facility];  Service: Gynecology;;  . APPLICATION OF WOUND VAC  01/11/2012   Procedure: APPLICATION OF WOUND VAC;  Surgeon: Jonnie Kind, MD;  [facility];  Service: Gynecology;  Laterality: N/A;  Fasicial Closure  . CESAREAN SECTION  12/22/2011   Procedure: CESAREAN SECTION;  Surgeon: Donnamae Jude, MD;  Location: [facility];  Service: Obstetrics;  Laterality: N/A;  Primary cesarean section with delivery of Baby A girl at 29. Baby B girl at 60.  Marland Kitchen ECTOPIC PREGNANCY SURGERY    . INCISION AND DRAINAGE OF WOUND  01/07/2012   Procedure: IRRIGATION AND DEBRIDEMENT WOUND;  Surgeon: Jonnie Kind, MD;  [facility]: [facility];  Service: Gynecology;;  . INSERTION OF MESH  01/03/2012   Procedure: INSERTION OF MESH;  Surgeon: Jonnie Kind, MD;  [facility];  Service: Gynecology;  Laterality: N/A;  . MANDIBLE SURGERY  cancer  . NASAL SINUS SURGERY    . SECONDARY CLOSURE OF WOUND  01/22/2012   Procedure: SECONDARY CLOSURE OF  WOUND;  Surgeon: Jonnie Kind, MD;  [facility]: [facility];  Service: Gynecology;  Laterality: N/A;  . WOUND EXPLORATION  01/03/2012   Procedure: WOUND EXPLORATION;  Surgeon: Jonnie Kind, MD;  [facility];  Service: Gynecology;  Laterality: N/A;  . WOUND EXPLORATION  01/05/2012   Procedure: WOUND EXPLORATION;  Surgeon: Jonnie Kind, MD;  [facility];  Service: Gynecology;  Laterality: N/A;  Re-Exploration of Wound Abscess with Debridement  . WOUND EXPLORATION  01/07/2012   Procedure: WOUND EXPLORATION;  Surgeon: Jonnie Kind, MD;  [facility];  Service: Gynecology;;  . WOUND EXPLORATION  01/11/2012   Procedure: WOUND EXPLORATION;  Surgeon: Jonnie Kind, MD;  [facility];  Service: Gynecology;  Laterality:  N/A;  Debridment of Wound Abscess  . WOUND EXPLORATION  01/22/2012   Procedure: WOUND EXPLORATION;  Surgeon: Jonnie Kind, MD;  [facility];  Service: Gynecology;  Laterality: N/A;  Delayed Secondary Wound Closure  . WRIST SURGERY     Medications: Reviewed & Updated - see associated section                       Current Outpatient Prescriptions:  .  aspirin-acetaminophen-caffeine (EXCEDRIN MIGRAINE) 250-250-65 MG tablet, Take 1 tablet by mouth as needed for headache., Disp: , Rfl:  .  Norethindrone Acetate-Ethinyl Estrad-FE (LOESTRIN 24 FE) 1-20 MG-MCG(24) tablet, Take 1 tablet by mouth daily., Disp: 1 Package, Rfl: 11 .  pantoprazole (PROTONIX) 40 MG tablet, , Disp: , Rfl:  .  SUMAtriptan (IMITREX) 100 MG tablet, Take 100 mg by mouth as needed. , Disp: , Rfl:    Social History: Reviewed -  reports that she has never smoked. She has never used smokeless tobacco.  Objective Findings:  Vitals: Blood pressure 124/80, pulse 80, weight 184 lb (83.5 kg).  Physical Examination: General appearance - alert, well appearing, and in no distress, oriented to person, place, and time and normal appearing weight Mental status - alert, oriented to person, place, and time, normal mood, behavior, speech, dress, motor activity, and thought processes Abdomen - soft, nontender, nondistended, no masses or organomegaly Anterior abdominal wall has multiple well-healed sites from her previous necrotizing fasciitis surgeries. The abdominal wall was surprisingly normal in appearance  Result reviewed after patient's visit  GYNECOLOGIC SONOGRAM   Kara Gentry is a 41 y.o. [id number] unknown LMP, she is here for a pelvic sonogram for pelvic pain.  Uterus                      9.7 x 6 x 6.8 cm,  heterogeneous uterus w/mult fibroids  Endometrium          4.2 mm, symmetrical, endometrium appears to be distorted by submucosal fibroid  Right ovary             3.2 x 1.6 x 2.8 cm, wnl  Left ovary                 3.1 x 1.3 x 2.4 cm, wnl, unable to move left ovary w/abdominal and probe pressure  Fibroids:                  #1) pedunculated subserosal fibroid fundal right 3.4 x 3.2 x 3.7 cm,(#2) post submucosal 2.8 x  3 x 2.6 cm,(#3) intramural post left 1.8 x 1.8 x 1.5 cm  Technician Comments:  PELVIC US TA/TV: heterogeneous uterus w/mult fibroids,(#1) pedunculated subserosal fibroid fundal right 3.4 x 3.2 x 3.7 cm,(#2) post submucosal 2.8 x 3 x 2.6 cm,(#3) intramural post left 1.8 x 1.8 x 1.5 cm, EEC 4.2 mm,endometrium appears to be distorted by submucosal fibroid,normal ovaries bilat,right ovary appears mobile, unable to move left ovary w/abdominal and probe pressure,no free fluid,mult small nabothian cysts    Silver Huguenin 05/28/2016 5:02 PM  Clinical Impression and recommendations:  I have reviewed the sonogram results above, combined with the patient's current clinical course, below are my impressions and any appropriate recommendations for management based on the sonographic findings.  Uterus with multiple myomas, volume is about the 85%tile endometrium is normal altered by myoma Ovaries are normal, no pathology but the left ovary does not "slide" suggesting probable adhesions of the left ovary due to previous surgeries  Pain source could be the ovary which is adherent and/or the fibroids present   EURE,LUTHER H    Assessment & Plan:   A:  1. Pelvic pain with suspected uterine fibroids, pelvic adhesions and extensive scarring in the anterior abdominal wall status post neck causing fasciitis 2013  P:  1. The patient is not likely to be helped by endometrial ablation. The ovarian adhesions will remain. If hysterectomy were to be considered it would certainly need to involve extensive bowel prep and careful surgery. Supracervical hysterectomy would need to be considered, through a midline incision, possibly with Gen. surgery backup if  needed.

## 2016-07-10 ENCOUNTER — Telehealth: Payer: Self-pay | Admitting: Obstetrics and Gynecology

## 2016-07-11 NOTE — Telephone Encounter (Signed)
Informed patient that per my conversation with Dr Glo Herring, sonohysterogram may be done in the office but he wanted to do some research regarding a possible referral to [facility] if needed. Will get back to patient once he figures out what to do. Verbalized understanding.

## 2016-07-23 ENCOUNTER — Telehealth: Payer: Self-pay | Admitting: Obstetrics and Gynecology

## 2016-07-23 NOTE — Telephone Encounter (Signed)
Pt called stating that she would like to speak with Dr. [name] nurse regarding a sonohysterography. Pt states that her summer schedule is crazy. Please contact pt

## 2016-07-25 ENCOUNTER — Telehealth: Payer: Self-pay | Admitting: Obstetrics and Gynecology

## 2016-07-25 NOTE — Telephone Encounter (Signed)
Pt called wanting to know when the sonohysterography that she needs will take place. She stated that she needs for it to be done soon because if she needs surgery she would like to do that in the summer when she's off. Advised pt that Dr. Glo Herring would give her a call this afternoon.

## 2016-07-25 NOTE — Telephone Encounter (Signed)
Pt called stating that she would like to speak with Dr. [name] nurse, Pt did not state the reason why. Please contact pt

## 2016-07-27 NOTE — Telephone Encounter (Signed)
Will set up sonohysterogram in the office within the next week

## 2016-08-07 ENCOUNTER — Other Ambulatory Visit: Payer: Self-pay | Admitting: Obstetrics and Gynecology

## 2016-08-07 DIAGNOSIS — D250 Submucous leiomyoma of uterus: Secondary | ICD-10-CM

## 2016-08-07 DIAGNOSIS — D252 Subserosal leiomyoma of uterus: Principal | ICD-10-CM

## 2016-08-08 ENCOUNTER — Ambulatory Visit (INDEPENDENT_AMBULATORY_CARE_PROVIDER_SITE_OTHER): Payer: BC Managed Care – PPO

## 2016-08-08 DIAGNOSIS — D252 Subserosal leiomyoma of uterus: Secondary | ICD-10-CM

## 2016-08-08 DIAGNOSIS — D250 Submucous leiomyoma of uterus: Secondary | ICD-10-CM | POA: Diagnosis not present

## 2016-08-08 NOTE — Progress Notes (Signed)
TV SONOHYSTEROGRAM: sonohysterogram was preformed by Dr.Ferguson. Saline outlined the smooth symmetrical walls of the endometrium. Multiple fibroids,but no solitary masses or pathology noted within the endometrium.

## 2016-08-20 NOTE — Patient Instructions (Signed)
Kara Gentry  08/20/2016     @PREFPERIOPPHARMACY @   Your procedure is scheduled on  09/04/2016  Report to [facility] at  53  A.M.  Call this number if you have problems the morning of surgery:  [phone]   Remember:  Do not eat food or drink liquids after midnight.  Take these medicines the morning of surgery with A SIP OF WATER  Protonix, imitrex.   Do not wear jewelry, make-up or nail polish.  Do not wear lotions, powders, or perfumes, or deoderant.  Do not shave 48 hours prior to surgery.  Men may shave face and neck.  Do not bring valuables to the hospital.  [facility] is not responsible for any belongings or valuables.  Contacts, dentures or bridgework may not be worn into surgery.  Leave your suitcase in the car.  After surgery it may be brought to your room.  For patients admitted to the hospital, discharge time will be determined by your treatment team.  Patients discharged the day of surgery will not be allowed to drive home.   Name and phone number of your driver:   family Special instructions:  None  Please read over the following fact sheets that you were given. Anesthesia Post-op Instructions and Care and Recovery After Surgery       Salpingectomy Salpingectomy, also called tubectomy, is the surgical removal of one of the fallopian tubes. The fallopian tubes are where eggs travel from the ovaries to the uterus. Removing one fallopian tube does not prevent you from becoming pregnant. It also does not cause problems with your menstrual periods. You may need a salpingectomy if you:  Have a fertilized egg that attaches to the fallopian tube (ectopic pregnancy), especially one that causes the tube to burst or tear (rupture).  Have an infected fallopian tube.  Have cancer of the fallopian tube or nearby organs.  Have had an ovary removed due to a cyst or tumor.  Have had your uterus removed.  There are three different  methods that can be used for a salpingectomy:  Open. This method involves making one large incision in your abdomen.  Laparoscopic. This method involves using a thin, lighted tube with a tiny camera on the end (laparoscope) to help perform the procedure. The laparoscope will allow your surgeon to make several small incisions in the abdomen instead of a large incision.  Robot-assisted: This method involves using a computer to control surgical instruments that are attached to robotic arms.  Tell a health care provider about:  Any allergies you have.  All medicines you are taking, including vitamins, herbs, eye drops, creams, and over-the-counter medicines.  Any problems you or family members have had with anesthetic medicines.  Any blood disorders you have.  Any surgeries you have had.  Any medical conditions you have.  Whether you are pregnant or may be pregnant. What are the risks? Generally, this is a safe procedure. However, problems may occur, including:  Infection.  Bleeding.  Allergic reactions to medicines.  Damage to other structures or organs.  Blood clots in the legs or lungs.  What happens before the procedure? Staying hydrated Follow instructions from your health care provider about hydration, which may include:  Up to 2 hours before the procedure - you may continue to drink clear liquids, such as water, clear fruit juice, black coffee, and plain tea.  Eating and drinking restrictions Follow instructions from your health care provider about eating and drinking, which may include:  8 hours before the procedure - stop eating heavy meals or foods such as meat, fried foods, or fatty foods.  6 hours before the procedure - stop eating light meals or foods, such as toast or cereal.  6 hours before the procedure - stop drinking milk or drinks that contain milk.  2 hours before the procedure - stop drinking clear liquids.  Medicines  Ask your health care  provider about: ? Changing or stopping your regular medicines. This is especially important if you are taking diabetes medicines or blood thinners. ? Taking medicines such as aspirin and ibuprofen. These medicines can thin your blood. Do not take these medicines before your procedure if your health care provider instructs you not to.  You may be given antibiotic medicine to help prevent infection. General instructions  Do not smoke for at least 2 weeks before your procedure. If you need help quitting, ask your health care provider.  You may have an exam or tests, such as an electrocardiogram (ECG).  You may have a blood or urine sample taken.  Ask your health care provider: ? Whether you should stop removing hair from your surgical area. ? How your surgical site will be marked or identified.  You may be asked to shower with a germ-killing soap.  Plan to have someone take you home from the hospital or clinic.  If you will be going home right after the procedure, plan to have someone with you for 24 hours. What happens during the procedure?  To reduce your risk of infection: ? Your health care team will wash or sanitize their hands. ? Hair may be removed from the surgical area. ? Your skin will be washed with soap.  An IV tube will be inserted into one of your veins.  You will be given a medicine to make you fall asleep (general anesthetic). You may also be given a medicine to help you relax (sedative).  A thin tube (catheter) may be inserted through your urethra and into your bladder to drain urine during your procedure.  Depending on the type of procedure you are having, one incision or several small incisions will be made in your abdomen.  Your fallopian tube will be cut and removed from where it attaches to your uterus.  Your blood vessels will be clamped and tied to prevent excess bleeding.  The incision(s) in your abdomen will be closed with stitches (sutures), staples,  or skin glue.  A bandage (dressing) may be placed over your incision(s). The procedure may vary among health care providers and hospitals. What happens after the procedure?  Your blood pressure, heart rate, breathing rate, and blood oxygen level will be monitored until the medicines you were given have worn off.  You may continue to receive fluids and medicines through an IV tube.  You may continue to have a catheter draining your urine.  You may have to wear compression stockings. These stockings help to prevent blood clots and reduce swelling in your legs.  You will be given pain medicine as needed.  Do not drive for 24 hours if you received a sedative. Summary  Salpingectomy is a surgical procedure to remove one of the fallopian tubes.  The procedure may be done with an open incision, with a laparoscope, or with computer-controlled instruments.  Depending on the type of procedure you are having, one incision or several  small incisions will be made in your abdomen.  Your blood pressure, heart rate, breathing rate, and blood oxygen level will be monitored until the medicines you were given have worn off.  Plan to have someone take you home from the hospital or clinic. This information is not intended to replace advice given to you by your health care provider. Make sure you discuss any questions you have with your health care provider. Document Released: 06/24/2008 Document Revised: 09/23/2015 Document Reviewed: 07/30/2012 Elsevier Interactive Patient Education  2018 [place] After This sheet gives you information about how to care for yourself after your procedure. Your health care provider may also give you more specific instructions. If you have problems or questions, contact your health care provider. What can I expect after the procedure? After your procedure, it is common to have:  Pain in your abdomen.  Some occasional vaginal bleeding  (spotting).  Tiredness.  Follow these instructions at home: Incision care   Keep your incision area and your bandage (dressing) clean and dry.  Follow instructions from your health care provider about how to take care of your incision. Make sure you: ? Wash your hands with soap and water before you change your dressing. If soap and water are not available, use hand sanitizer. ? Change your dressing astold by your health care provider. ? Leave stitches (sutures), staples, skin glue, or adhesive strips in place. These skin closures may need to stay in place for 2 weeks or longer. If adhesive strip edges start to loosen and curl up, you may trim the loose edges. Do not remove adhesive strips completely unless your health care provider tells you to do that.  Check your incision area every day for signs of infection. Check for: ? More redness, swelling, or pain. ? More fluid or blood. ? Warmth. ? Pus or a bad smell. Activity   Do not drive or use heavy machinery while taking prescription pain medicine.  Do not drive for 24 hours if you received a medicine to help you relax (sedative).  Rest as directed by your health care provider. Ask your health care provider what activities are safe for you. You should avoid: ? Lifting anything that is heavier than 10 lb (4.5 kg) until your health care provider approves. ? Activities that require a lot of energy.  Until your health care provider approves: ? Do not douche. ? Do not use tampons. ? Do not have sexual intercourse. General instructions  Take over-the-counter and prescription medicines only as told by your health care provider.  To prevent or treat constipation while you are taking prescription pain medicine, your health care provider may recommend that you: ? Drink enough fluid to keep your urine clear or pale yellow. ? Take over-the-counter or prescription medicines. ? Eat foods that are high in fiber, such as fresh fruits and  vegetables, whole grains, and beans. ? Limit foods that are high in fat and processed sugars, such as fried and sweet foods.  Do not take baths, swim, or use a hot tub until your health care provider approves. You may take showers.  Wear compression stockings as told by your health care provider. These stockings help to prevent blood clots and reduce swelling in your legs.  Keep all follow-up visits as told by your health care provider. This is important. Contact a health care provider if:  You have: ? Pain when you urinate. ? More redness, swelling, or pain around your incision. ?  More fluid or blood coming from your incision. ? Pus or a bad smell coming from your incision. ? A fever. ? Abdominal pain that gets worse or does not get better with medicine.  Your incision feels warm to the touch.  Your incision starts to break open.  You develop a rash.  You develop nausea and vomiting.  You feel light-headed. Get help right away if:  You develop pain in your chest or leg.  You develop shortness of breath.  You faint.  You have increased vaginal bleeding. This information is not intended to replace advice given to you by your health care provider. Make sure you discuss any questions you have with your health care provider. Document Released: 05/12/2010 Document Revised: 10/05/2015 Document Reviewed: 10/06/2015 Elsevier Interactive Patient Education  [employer]. Hysteroscopy Hysteroscopy is a procedure used for looking inside the womb (uterus). It may be done for various reasons, including:  To evaluate abnormal bleeding, fibroid (benign, noncancerous) tumors, polyps, scar tissue (adhesions), and possibly cancer of the uterus.  To look for lumps (tumors) and other uterine growths.  To look for causes of why a woman cannot get pregnant (infertility), causes of recurrent loss of pregnancy (miscarriages), or a lost intrauterine device (IUD).  To perform a  sterilization by blocking the fallopian tubes from inside the uterus.  In this procedure, a thin, flexible tube with a tiny light and camera on the end of it (hysteroscope) is used to look inside the uterus. A hysteroscopy should be done right after a menstrual period to be sure you are not pregnant. LET [facility] CARE PROVIDER KNOW ABOUT:  Any allergies you have.  All medicines you are taking, including vitamins, herbs, eye drops, creams, and over-the-counter medicines.  Previous problems you or members of your family have had with the use of anesthetics.  Any blood disorders you have.  Previous surgeries you have had.  Medical conditions you have. RISKS AND COMPLICATIONS Generally, this is a safe procedure. However, as with any procedure, complications can occur. Possible complications include:  Putting a hole in the uterus.  Excessive bleeding.  Infection.  Damage to the cervix.  Injury to other organs.  Allergic reaction to medicines.  Too much fluid used in the uterus for the procedure.  BEFORE THE PROCEDURE  Ask your health care provider about changing or stopping any regular medicines.  Do not take aspirin or blood thinners for 1 week before the procedure, or as directed by your health care provider. These can cause bleeding.  If you smoke, do not smoke for 2 weeks before the procedure.  In some cases, a medicine is placed in the cervix the day before the procedure. This medicine makes the cervix have a larger opening (dilate). This makes it easier for the instrument to be inserted into the uterus during the procedure.  Do not eat or drink anything for at least 8 hours before the surgery.  Arrange for someone to take you home after the procedure. PROCEDURE  You may be given a medicine to relax you (sedative). You may also be given one of the following: ? A medicine that numbs the area around the cervix (local anesthetic). ? A medicine that makes you sleep  through the procedure (general anesthetic).  The hysteroscope is inserted through the vagina into the uterus. The camera on the hysteroscope sends a picture to a TV screen. This gives the surgeon a good view inside the uterus.  During the procedure, air or  a liquid is put into the uterus, which allows the surgeon to see better.  Sometimes, tissue is gently scraped from inside the uterus. These tissue samples are sent to a lab for testing. What to expect after the procedure  If you had a general anesthetic, you may be groggy for a couple hours after the procedure.  If you had a local anesthetic, you will be able to go home as soon as you are stable and feel ready.  You may have some cramping. This normally lasts for a couple days.  You may have bleeding, which varies from light spotting for a few days to menstrual-like bleeding for 3-7 days. This is normal.  If your test results are not back during the visit, make an appointment with your health care provider to find out the results. This information is not intended to replace advice given to you by your health care provider. Make sure you discuss any questions you have with your health care provider. Document Released: 05/14/2000 Document Revised: 07/14/2015 Document Reviewed: 09/04/2012 Elsevier Interactive Patient Education  2017 [place]. Hysteroscopy, Care After Refer to this sheet in the next few weeks. These instructions provide you with information on caring for yourself after your procedure. Your health care provider may also give you more specific instructions. Your treatment has been planned according to current medical practices, but problems sometimes occur. Call your health care provider if you have any problems or questions after your procedure. What can I expect after the procedure? After your procedure, it is typical to have the following:  You may have some cramping. This normally lasts for a couple days.  You may  have bleeding. This can vary from light spotting for a few days to menstrual-like bleeding for 3-7 days.  Follow these instructions at home:  Rest for the first 1-2 days after the procedure.  Only take over-the-counter or prescription medicines as directed by your health care provider. Do not take aspirin. It can increase the chances of bleeding.  Take showers instead of baths for 2 weeks or as directed by your health care provider.  Do not drive for 24 hours or as directed.  Do not drink alcohol while taking pain medicine.  Do not use tampons, douche, or have sexual intercourse for 2 weeks or until your health care provider says it is okay.  Take your temperature twice a day for 4-5 days. Write it down each time.  Follow your health care provider's advice about diet, exercise, and lifting.  If you develop constipation, you may: ? Take a mild laxative if your health care provider approves. ? Add bran foods to your diet. ? Drink enough fluids to keep your urine clear or pale yellow.  Try to have someone with you or available to you for the first 24-48 hours, especially if you were given a general anesthetic.  Follow up with your health care provider as directed. Contact a health care provider if:  You feel dizzy or lightheaded.  You feel sick to your stomach (nauseous).  You have abnormal vaginal discharge.  You have a rash.  You have pain that is not controlled with medicine. Get help right away if:  You have bleeding that is heavier than a normal menstrual period.  You have a fever.  You have increasing cramps or pain, not controlled with medicine.  You have new belly (abdominal) pain.  You pass out.  You have pain in the tops of your shoulders (shoulder strap areas).  You have shortness of breath. This information is not intended to replace advice given to you by your health care provider. Make sure you discuss any questions you have with your health care  provider. Document Released: 11/26/2012 Document Revised: 07/14/2015 Document Reviewed: 09/04/2012 Elsevier Interactive Patient Education  2017 [employer]  Dilation and Curettage or Vacuum Curettage, Care After This sheet gives you information about how to care for yourself after your procedure. Your health care provider may also give you more specific instructions. If you have problems or questions, contact your health care provider. What can I expect after the procedure? After your procedure, it is common to have:  Mild pain or cramping.  Some vaginal bleeding or spotting.  These may last for up to 2 weeks after your procedure. Follow these instructions at home: Activity   Do not drive or use heavy machinery while taking prescription pain medicine.  Avoid driving for the first 24 hours after your procedure.  Take frequent, short walks, followed by rest periods, throughout the day. Ask your health care provider what activities are safe for you. After 1-2 days, you may be able to return to your normal activities.  Do not lift anything heavier than 10 lb (4.5 kg) until your health care provider approves.  For at least 2 weeks, or as long as told by your health care provider, do not: ? Douche. ? Use tampons. ? Have sexual intercourse. General instructions   Take over-the-counter and prescription medicines only as told by your health care provider. This is especially important if you take blood thinning medicine.  Do not take baths, swim, or use a hot tub until your health care provider approves. Take showers instead of baths.  Wear compression stockings as told by your health care provider. These stockings help to prevent blood clots and reduce swelling in your legs.  It is your responsibility to get the results of your procedure. Ask your health care provider, or the department performing the procedure, when your results will be ready.  Keep all follow-up visits as told by  your health care provider. This is important. Contact a health care provider if:  You have severe cramps that get worse or that do not get better with medicine.  You have severe abdominal pain.  You cannot drink fluids without vomiting.  You develop pain in a different area of your pelvis.  You have bad-smelling vaginal discharge.  You have a rash. Get help right away if:  You have vaginal bleeding that soaks more than one sanitary pad in 1 hour, for 2 hours in a row.  You pass large blood clots from your vagina.  You have a fever that is above 100.50F (38.0C).  Your abdomen feels very tender or hard.  You have chest pain.  You have shortness of breath.  You cough up blood.  You feel dizzy or light-headed.  You faint.  You have pain in your neck or shoulder area. This information is not intended to replace advice given to you by your health care provider. Make sure you discuss any questions you have with your health care provider. Document Released: 02/03/2000 Document Revised: 10/05/2015 Document Reviewed: 09/08/2015 Elsevier Interactive Patient Education  2018 [employer].  Dilation and Curettage or Vacuum Curettage, Care After These instructions give you information about caring for yourself after your procedure. Your doctor may also give you more specific instructions. Call your doctor if you have any problems or questions after your procedure. Follow these  instructions at home: Activity  Do not drive or use heavy machinery while taking prescription pain medicine.  For 24 hours after your procedure, avoid driving.  Take short walks often, followed by rest periods. Ask your doctor what activities are safe for you. After one or two days, you may be able to return to your normal activities.  Do not lift anything that is heavier than 10 lb (4.5 kg) until your doctor approves.  For at least 2 weeks, or as long as told by your doctor: ? Do not douche. ? Do not  use tampons. ? Do not have sex. General instructions  Take over-the-counter and prescription medicines only as told by your doctor. This is very important if you take blood thinning medicine.  Do not take baths, swim, or use a hot tub until your doctor approves. Take showers instead of baths.  Wear compression stockings as told by your doctor.  It is up to you to get the results of your procedure. Ask your doctor when your results will be ready.  Keep all follow-up visits as told by your doctor. This is important. Contact a doctor if:  You have very bad cramps that get worse or do not get better with medicine.  You have very bad pain in your belly (abdomen).  You cannot drink fluids without throwing up (vomiting).  You get pain in a different part of the area between your belly and thighs (pelvis).  You have bad-smelling discharge from your vagina.  You have a rash. Get help right away if:  You are bleeding a lot from your vagina. A lot of bleeding means soaking more than one sanitary pad in an hour, for 2 hours in a row.  You have clumps of blood (blood clots) coming from your vagina.  You have a fever or chills.  Your belly feels very tender or hard.  You have chest pain.  You have trouble breathing.  You cough up blood.  You feel dizzy.  You feel light-headed.  You pass out (faint).  You have pain in your neck or shoulder area. Summary  Take short walks often, followed by rest periods. Ask your doctor what activities are safe for you. After one or two days, you may be able to return to your normal activities.  Do not lift anything that is heavier than 10 lb (4.5 kg) until your doctor approves.  Do not take baths, swim, or use a hot tub until your doctor approves. Take showers instead of baths.  Contact your doctor if you have any symptoms of infection, like bad-smelling discharge from your vagina. This information is not intended to replace advice given to  you by your health care provider. Make sure you discuss any questions you have with your health care provider. Document Released: 11/15/2007 Document Revised: 10/24/2015 Document Reviewed: 10/24/2015 Elsevier Interactive Patient Education  2017 [place].  Endometrial Ablation Endometrial ablation is a procedure that destroys the thin inner layer of the lining of the uterus (endometrium). This procedure may be done:  To stop heavy periods.  To stop bleeding that is causing anemia.  To control irregular bleeding.  To treat bleeding caused by small tumors (fibroids) in the endometrium.  This procedure is often an alternative to major surgery, such as removal of the uterus and cervix (hysterectomy). As a result of this procedure:  You may not be able to have children. However, if you are premenopausal (you have not gone through menopause): ? You may  still have a small chance of getting pregnant. ? You will need to use a reliable method of birth control after the procedure to prevent pregnancy.  You may stop having a menstrual period, or you may have only a small amount of bleeding during your period. Menstruation may return several years after the procedure.  Tell a health care provider about:  Any allergies you have.  All medicines you are taking, including vitamins, herbs, eye drops, creams, and over-the-counter medicines.  Any problems you or family members have had with the use of anesthetic medicines.  Any blood disorders you have.  Any surgeries you have had.  Any medical conditions you have. What are the risks? Generally, this is a safe procedure. However, problems may occur, including:  A hole (perforation) in the uterus or bowel.  Infection of the uterus, bladder, or vagina.  Bleeding.  Damage to other structures or organs.  An air bubble in the lung (air embolus).  Problems with pregnancy after the procedure.  Failure of the procedure.  Decreased  ability to diagnose cancer in the endometrium.  What happens before the procedure?  You will have tests of your endometrium to make sure there are no pre-cancerous cells or cancer cells present.  You may have an ultrasound of the uterus.  You may be given medicines to thin the endometrium.  Ask your health care provider about: ? Changing or stopping your regular medicines. This is especially important if you take diabetes medicines or blood thinners. ? Taking medicines such as aspirin and ibuprofen. These medicines can thin your blood. Do not take these medicines before your procedure if your doctor tells you not to.  Plan to have someone take you home from the hospital or clinic. What happens during the procedure?  You will lie on an exam table with your feet and legs supported as in a pelvic exam.  To lower your risk of infection: ? Your health care team will wash or sanitize their hands and put on germ-free (sterile) gloves. ? Your genital area will be washed with soap.  An IV tube will be inserted into one of your veins.  You will be given a medicine to help you relax (sedative).  A surgical instrument with a light and camera (resectoscope) will be inserted into your vagina and moved into your uterus. This allows your surgeon to see inside your uterus.  Endometrial tissue will be removed using one of the following methods: ? Radiofrequency. This method uses a radiofrequency-alternating electric current to remove the endometrium. ? Cryotherapy. This method uses extreme cold to freeze the endometrium. ? Heated-free liquid. This method uses a heated saltwater (saline) solution to remove the endometrium. ? Microwave. This method uses high-energy microwaves to heat up the endometrium and remove it. ? Thermal balloon. This method involves inserting a catheter with a balloon tip into the uterus. The balloon tip is filled with heated fluid to remove the endometrium. The procedure may  vary among health care providers and hospitals. What happens after the procedure?  Your blood pressure, heart rate, breathing rate, and blood oxygen level will be monitored until the medicines you were given have worn off.  As tissue healing occurs, you may notice vaginal bleeding for 4-6 weeks after the procedure. You may also experience: ? Cramps. ? Thin, watery vaginal discharge that is light pink or brown in color. ? A need to urinate more frequently than usual. ? Nausea.  Do not drive for 24 hours if you  were given a sedative.  Do not have sex or insert anything into your vagina until your health care provider approves. Summary  Endometrial ablation is done to treat the many causes of heavy menstrual bleeding.  The procedure may be done only after medications have been tried to control the bleeding.  Plan to have someone take you home from the hospital or clinic. This information is not intended to replace advice given to you by your health care provider. Make sure you discuss any questions you have with your health care provider. Document Released: 12/16/2003 Document Revised: 02/23/2016 Document Reviewed: 02/23/2016 Elsevier Interactive Patient Education  2017 [place] Anesthesia, Adult General anesthesia is the use of medicines to make a person "go to sleep" (be unconscious) for a medical procedure. General anesthesia is often recommended when a procedure:  Is long.  Requires you to be still or in an unusual position.  Is major and can cause you to lose blood.  Is impossible to do without general anesthesia.  The medicines used for general anesthesia are called general anesthetics. In addition to making you sleep, the medicines:  Prevent pain.  Control your blood pressure.  Relax your muscles.  Tell a health care provider about:  Any allergies you have.  All medicines you are taking, including vitamins, herbs, eye drops, creams, and  over-the-counter medicines.  Any problems you or family members have had with anesthetic medicines.  Types of anesthetics you have had in the past.  Any bleeding disorders you have.  Any surgeries you have had.  Any medical conditions you have.  Any history of heart or lung conditions, such as heart failure, sleep apnea, or chronic obstructive pulmonary disease (COPD).  Whether you are pregnant or may be pregnant.  Whether you use tobacco, alcohol, marijuana, or street drugs.  Any history of Armed forces logistics/support/administrative officer.  Any history of depression or anxiety. What are the risks? Generally, this is a safe procedure. However, problems may occur, including:  Allergic reaction to anesthetics.  Lung and heart problems.  Inhaling food or liquids from your stomach into your lungs (aspiration).  Injury to nerves.  Waking up during your procedure and being unable to move (rare).  Extreme agitation or a state of mental confusion (delirium) when you wake up from the anesthetic.  Air in the bloodstream, which can lead to stroke.  These problems are more likely to develop if you are having a major surgery or if you have an advanced medical condition. You can prevent some of these complications by answering all of your health care provider's questions thoroughly and by following all pre-procedure instructions. General anesthesia can cause side effects, including:  Nausea or vomiting  A sore throat from the breathing tube.  Feeling cold or shivery.  Feeling tired, washed out, or achy.  Sleepiness or drowsiness.  Confusion or agitation.  What happens before the procedure? Staying hydrated Follow instructions from your health care provider about hydration, which may include:  Up to 2 hours before the procedure - you may continue to drink clear liquids, such as water, clear fruit juice, black coffee, and plain tea.  Eating and drinking restrictions Follow instructions from your health  care provider about eating and drinking, which may include:  8 hours before the procedure - stop eating heavy meals or foods such as meat, fried foods, or fatty foods.  6 hours before the procedure - stop eating light meals or foods, such as toast or cereal.  6 hours  before the procedure - stop drinking milk or drinks that contain milk.  2 hours before the procedure - stop drinking clear liquids.  Medicines  Ask your health care provider about: ? Changing or stopping your regular medicines. This is especially important if you are taking diabetes medicines or blood thinners. ? Taking medicines such as aspirin and ibuprofen. These medicines can thin your blood. Do not take these medicines before your procedure if your health care provider instructs you not to. ? Taking new dietary supplements or medicines. Do not take these during the week before your procedure unless your health care provider approves them.  If you are told to take a medicine or to continue taking a medicine on the day of the procedure, take the medicine with sips of water. General instructions   Ask if you will be going home the same day, the following day, or after a longer hospital stay. ? Plan to have someone take you home. ? Plan to have someone stay with you for the first 24 hours after you leave the hospital or clinic.  For 3-6 weeks before the procedure, try not to use any tobacco products, such as cigarettes, chewing tobacco, and e-cigarettes.  You may brush your teeth on the morning of the procedure, but make sure to spit out the toothpaste. What happens during the procedure?  You will be given anesthetics through a mask and through an IV tube in one of your veins.  You may receive medicine to help you relax (sedative).  As soon as you are asleep, a breathing tube may be used to help you breathe.  An anesthesia specialist will stay with you throughout the procedure. He or she will help keep you  comfortable and safe by continuing to give you medicines and adjusting the amount of medicine that you get. He or she will also watch your blood pressure, pulse, and oxygen levels to make sure that the anesthetics do not cause any problems.  If a breathing tube was used to help you breathe, it will be removed before you wake up. The procedure may vary among health care providers and hospitals. What happens after the procedure?  You will wake up, often slowly, after the procedure is complete, usually in a recovery area.  Your blood pressure, heart rate, breathing rate, and blood oxygen level will be monitored until the medicines you were given have worn off.  You may be given medicine to help you calm down if you feel anxious or agitated.  If you will be going home the same day, your health care provider may check to make sure you can stand, drink, and urinate.  Your health care providers will treat your pain and side effects before you go home.  Do not drive for 24 hours if you received a sedative.  You may: ? Feel nauseous and vomit. ? Have a sore throat. ? Have mental slowness. ? Feel cold or shivery. ? Feel sleepy. ? Feel tired. ? Feel sore or achy, even in parts of your body where you did not have surgery. This information is not intended to replace advice given to you by your health care provider. Make sure you discuss any questions you have with your health care provider. Document Released: 05/15/2007 Document Revised: 07/19/2015 Document Reviewed: 01/20/2015 Elsevier Interactive Patient Education  2018 [place] Anesthesia, Adult, Care After These instructions provide you with information about caring for yourself after your procedure. Your health care provider may also give  you more specific instructions. Your treatment has been planned according to current medical practices, but problems sometimes occur. Call your health care provider if you have any problems or  questions after your procedure. What can I expect after the procedure? After the procedure, it is common to have:  Vomiting.  A sore throat.  Mental slowness.  It is common to feel:  Nauseous.  Cold or shivery.  Sleepy.  Tired.  Sore or achy, even in parts of your body where you did not have surgery.  Follow these instructions at home: For at least 24 hours after the procedure:  Do not: ? Participate in activities where you could fall or become injured. ? Drive. ? Use heavy machinery. ? Drink alcohol. ? Take sleeping pills or medicines that cause drowsiness. ? Make important decisions or sign legal documents. ? Take care of children on your own.  Rest. Eating and drinking  If you vomit, drink water, juice, or soup when you can drink without vomiting.  Drink enough fluid to keep your urine clear or pale yellow.  Make sure you have little or no nausea before eating solid foods.  Follow the diet recommended by your health care provider. General instructions  Have a responsible adult stay with you until you are awake and alert.  Return to your normal activities as told by your health care provider. Ask your health care provider what activities are safe for you.  Take over-the-counter and prescription medicines only as told by your health care provider.  If you smoke, do not smoke without supervision.  Keep all follow-up visits as told by your health care provider. This is important. Contact a health care provider if:  You continue to have nausea or vomiting at home, and medicines are not helpful.  You cannot drink fluids or start eating again.  You cannot urinate after 8-12 hours.  You develop a skin rash.  You have fever.  You have increasing redness at the site of your procedure. Get help right away if:  You have difficulty breathing.  You have chest pain.  You have unexpected bleeding.  You feel that you are having a life-threatening or  urgent problem. This information is not intended to replace advice given to you by your health care provider. Make sure you discuss any questions you have with your health care provider. Document Released: 05/14/2000 Document Revised: 07/11/2015 Document Reviewed: 01/20/2015 Elsevier Interactive Patient Education  [employer].

## 2016-08-28 ENCOUNTER — Telehealth: Payer: Self-pay | Admitting: *Deleted

## 2016-08-28 NOTE — Telephone Encounter (Signed)
Pharmacy states they need a new prescription for BCP stating the patient is to skip placebos. This is in order for patient to get the proper days supply.

## 2016-08-29 ENCOUNTER — Other Ambulatory Visit: Payer: Self-pay | Admitting: Obstetrics and Gynecology

## 2016-08-29 MED ORDER — NORETHIN ACE-ETH ESTRAD-FE 1-20 MG-MCG(24) PO TABS
1.0000 | ORAL_TABLET | Freq: Every day | ORAL | 11 refills | Status: DC
Start: 2016-08-29 — End: 2016-09-04

## 2016-08-29 NOTE — Progress Notes (Signed)
Loestrin filled.

## 2016-08-30 ENCOUNTER — Encounter (HOSPITAL_COMMUNITY)
Admission: RE | Admit: 2016-08-30 | Discharge: 2016-08-30 | Disposition: A | Discharge status: Home / Self Care | Payer: BC Managed Care – PPO | Source: Ambulatory Visit | Attending: Obstetrics and Gynecology | Admitting: Obstetrics and Gynecology

## 2016-08-30 ENCOUNTER — Encounter (HOSPITAL_COMMUNITY): Payer: Self-pay

## 2016-08-30 DIAGNOSIS — N920 Excessive and frequent menstruation with regular cycle: Secondary | ICD-10-CM | POA: Diagnosis not present

## 2016-08-30 DIAGNOSIS — Z01812 Encounter for preprocedural laboratory examination: Secondary | ICD-10-CM | POA: Diagnosis present

## 2016-08-30 HISTORY — DX: Anemia, unspecified: D64.9

## 2016-08-30 HISTORY — DX: Headache: R51

## 2016-08-30 HISTORY — DX: Malignant (primary) neoplasm, unspecified: C80.1

## 2016-08-30 HISTORY — DX: Headache, unspecified: R51.9

## 2016-08-30 HISTORY — DX: Bursopathy, unspecified: M71.9

## 2016-08-30 LAB — CBC
HCT: 37.9 % (ref 36.0–46.0)
Hemoglobin: 12.4 g/dL (ref 12.0–15.0)
MCH: 27.1 pg (ref 26.0–34.0)
MCHC: 32.7 g/dL (ref 30.0–36.0)
MCV: 82.8 fL (ref 78.0–100.0)
Platelets: 292 10*3/uL (ref 150–400)
RBC: 4.58 MIL/uL (ref 3.87–5.11)
RDW: 13.6 % (ref 11.5–15.5)
WBC: 5.5 10*3/uL (ref 4.0–10.5)

## 2016-08-30 LAB — BASIC METABOLIC PANEL
Anion gap: 5 (ref 5–15)
BUN: 16 mg/dL (ref 6–20)
CO2: 26 mmol/L (ref 22–32)
Calcium: 9.1 mg/dL (ref 8.9–10.3)
Chloride: 105 mmol/L (ref 101–111)
Creatinine, Ser: 0.61 mg/dL (ref 0.44–1.00)
GFR calc Af Amer: >60 mL/min (ref >60)
GFR calc non Af Amer: >60 mL/min (ref >60)
Glucose, Bld: 85 mg/dL (ref 65–99)
Potassium: 4.3 mmol/L (ref 3.5–5.1)
Sodium: 136 mmol/L (ref 135–145)

## 2016-08-30 LAB — HCG, SERUM, QUALITATIVE: Preg, Serum: NEGATIVE

## 2016-09-03 ENCOUNTER — Telehealth: Payer: Self-pay | Admitting: Obstetrics and Gynecology

## 2016-09-03 NOTE — Telephone Encounter (Signed)
Patient surgery time postponed til 10 am due to md meeting Pt case discussed further; pro and con  Of attempting salpingectomy discussed in view of pt's extensive pelvic infection history.  Will proceed with hysteroscopy, dilation and curettage, and Novasure ablation, with d/c of plans for salpingectomy. Pt agrees with plan.

## 2016-09-04 ENCOUNTER — Ambulatory Visit (HOSPITAL_COMMUNITY): Payer: BC Managed Care – PPO | Admitting: Anesthesiology

## 2016-09-04 ENCOUNTER — Encounter (HOSPITAL_COMMUNITY)
Admission: RE | Disposition: A | Discharge status: Home / Self Care | Payer: Self-pay | Source: Ambulatory Visit | Attending: Obstetrics and Gynecology

## 2016-09-04 ENCOUNTER — Ambulatory Visit (HOSPITAL_COMMUNITY)
Admission: RE | Admit: 2016-09-04 | Discharge: 2016-09-04 | Disposition: A | Discharge status: Home / Self Care | Payer: BC Managed Care – PPO | Source: Ambulatory Visit | Attending: Obstetrics and Gynecology | Admitting: Obstetrics and Gynecology

## 2016-09-04 DIAGNOSIS — R102 Pelvic and perineal pain: Secondary | ICD-10-CM

## 2016-09-04 DIAGNOSIS — Z801 Family history of malignant neoplasm of trachea, bronchus and lung: Secondary | ICD-10-CM | POA: Insufficient documentation

## 2016-09-04 DIAGNOSIS — M7551 Bursitis of right shoulder: Secondary | ICD-10-CM | POA: Diagnosis not present

## 2016-09-04 DIAGNOSIS — Z79899 Other long term (current) drug therapy: Secondary | ICD-10-CM | POA: Insufficient documentation

## 2016-09-04 DIAGNOSIS — D259 Leiomyoma of uterus, unspecified: Secondary | ICD-10-CM | POA: Insufficient documentation

## 2016-09-04 DIAGNOSIS — Z872 Personal history of diseases of the skin and subcutaneous tissue: Secondary | ICD-10-CM | POA: Insufficient documentation

## 2016-09-04 DIAGNOSIS — Z7982 Long term (current) use of aspirin: Secondary | ICD-10-CM | POA: Diagnosis not present

## 2016-09-04 DIAGNOSIS — N888 Other specified noninflammatory disorders of cervix uteri: Secondary | ICD-10-CM | POA: Insufficient documentation

## 2016-09-04 DIAGNOSIS — Z793 Long term (current) use of hormonal contraceptives: Secondary | ICD-10-CM | POA: Insufficient documentation

## 2016-09-04 DIAGNOSIS — G43909 Migraine, unspecified, not intractable, without status migrainosus: Secondary | ICD-10-CM | POA: Insufficient documentation

## 2016-09-04 DIAGNOSIS — N920 Excessive and frequent menstruation with regular cycle: Secondary | ICD-10-CM

## 2016-09-04 DIAGNOSIS — Z9889 Other specified postprocedural states: Secondary | ICD-10-CM | POA: Diagnosis not present

## 2016-09-04 DIAGNOSIS — E559 Vitamin D deficiency, unspecified: Secondary | ICD-10-CM | POA: Insufficient documentation

## 2016-09-04 DIAGNOSIS — Z8261 Family history of arthritis: Secondary | ICD-10-CM | POA: Diagnosis not present

## 2016-09-04 DIAGNOSIS — Z833 Family history of diabetes mellitus: Secondary | ICD-10-CM | POA: Insufficient documentation

## 2016-09-04 DIAGNOSIS — Z8042 Family history of malignant neoplasm of prostate: Secondary | ICD-10-CM | POA: Insufficient documentation

## 2016-09-04 HISTORY — PX: DILITATION & CURRETTAGE/HYSTROSCOPY WITH NOVASURE ABLATION: SHX5568

## 2016-09-04 SURGERY — DILATATION & CURETTAGE/HYSTEROSCOPY WITH NOVASURE ABLATION
Anesthesia: General | Site: Uterus

## 2016-09-04 MED ORDER — PROPOFOL 10 MG/ML IV BOLUS
INTRAVENOUS | Status: AC
  Filled 2016-09-04: qty 20

## 2016-09-04 MED ORDER — FENTANYL CITRATE (PF) 100 MCG/2ML IJ SOLN
INTRAMUSCULAR | Status: AC
  Filled 2016-09-04: qty 2

## 2016-09-04 MED ORDER — KETOROLAC TROMETHAMINE 10 MG PO TABS: 10.0000 mg | ORAL_TABLET | Freq: Four times a day (QID) | ORAL | 0 refills | Status: DC | PRN

## 2016-09-04 MED ORDER — PROPOFOL 10 MG/ML IV BOLUS
INTRAVENOUS | Status: DC | PRN
  Administered 2016-09-04: 200 mg via INTRAVENOUS

## 2016-09-04 MED ORDER — SCOPOLAMINE 1 MG/3DAYS TD PT72
MEDICATED_PATCH | TRANSDERMAL | Status: AC
  Filled 2016-09-04: qty 1

## 2016-09-04 MED ORDER — GLYCOPYRROLATE 0.2 MG/ML IJ SOLN
INTRAMUSCULAR | Status: DC | PRN
  Administered 2016-09-04: 0.2 mg via INTRAVENOUS

## 2016-09-04 MED ORDER — FENTANYL CITRATE (PF) 100 MCG/2ML IJ SOLN
25.0000 ug | INTRAMUSCULAR | Status: DC | PRN
  Administered 2016-09-04: 50 ug via INTRAVENOUS
  Filled 2016-09-04: qty 2

## 2016-09-04 MED ORDER — SODIUM CHLORIDE 0.9 % IR SOLN
Status: DC | PRN
  Administered 2016-09-04: 3000 mL
  Administered 2016-09-04: 1000 mL

## 2016-09-04 MED ORDER — MIDAZOLAM HCL 2 MG/2ML IJ SOLN
INTRAMUSCULAR | Status: AC
  Filled 2016-09-04: qty 2

## 2016-09-04 MED ORDER — GLYCOPYRROLATE 0.2 MG/ML IJ SOLN
INTRAMUSCULAR | Status: AC
  Filled 2016-09-04: qty 1

## 2016-09-04 MED ORDER — BUPIVACAINE-EPINEPHRINE (PF) 0.5% -1:200000 IJ SOLN
INTRAMUSCULAR | Status: AC
  Filled 2016-09-04: qty 30

## 2016-09-04 MED ORDER — DEXAMETHASONE SODIUM PHOSPHATE 4 MG/ML IJ SOLN
4.0000 mg | Freq: Once | INTRAMUSCULAR | Status: AC
  Administered 2016-09-04: 4 mg via INTRAVENOUS

## 2016-09-04 MED ORDER — MIDAZOLAM HCL 5 MG/5ML IJ SOLN
INTRAMUSCULAR | Status: DC | PRN
  Administered 2016-09-04: 2 mg via INTRAVENOUS

## 2016-09-04 MED ORDER — MIDAZOLAM HCL 2 MG/2ML IJ SOLN
1.0000 mg | INTRAMUSCULAR | Status: AC
  Administered 2016-09-04: 2 mg via INTRAVENOUS

## 2016-09-04 MED ORDER — TRAMADOL HCL 50 MG PO TABS: 50.0000 mg | ORAL_TABLET | Freq: Four times a day (QID) | ORAL | 0 refills | Status: DC | PRN

## 2016-09-04 MED ORDER — CEFAZOLIN SODIUM-DEXTROSE 2-4 GM/100ML-% IV SOLN
2.0000 g | Freq: Once | INTRAVENOUS | Status: AC
  Administered 2016-09-04: 2 g via INTRAVENOUS

## 2016-09-04 MED ORDER — LACTATED RINGERS IV SOLN
INTRAVENOUS | Status: DC
  Administered 2016-09-04: 1000 mL via INTRAVENOUS

## 2016-09-04 MED ORDER — FENTANYL CITRATE (PF) 100 MCG/2ML IJ SOLN
INTRAMUSCULAR | Status: DC | PRN
  Administered 2016-09-04 (×2): 50 ug via INTRAVENOUS

## 2016-09-04 MED ORDER — CEFAZOLIN SODIUM-DEXTROSE 2-4 GM/100ML-% IV SOLN
INTRAVENOUS | Status: AC
  Filled 2016-09-04: qty 100

## 2016-09-04 MED ORDER — BUPIVACAINE-EPINEPHRINE (PF) 0.5% -1:200000 IJ SOLN
INTRAMUSCULAR | Status: DC | PRN
  Administered 2016-09-04: 30 mL via PERINEURAL

## 2016-09-04 MED ORDER — ONDANSETRON HCL 4 MG/2ML IJ SOLN
4.0000 mg | Freq: Once | INTRAMUSCULAR | Status: AC
  Administered 2016-09-04: 4 mg via INTRAVENOUS

## 2016-09-04 MED ORDER — SCOPOLAMINE 1 MG/3DAYS TD PT72
1.0000 | MEDICATED_PATCH | Freq: Once | TRANSDERMAL | Status: DC
  Administered 2016-09-04: 1.5 mg via TRANSDERMAL

## 2016-09-04 MED ORDER — LIDOCAINE HCL 1 % IJ SOLN
INTRAMUSCULAR | Status: DC | PRN
  Administered 2016-09-04: 30 mg via INTRADERMAL

## 2016-09-04 MED ORDER — ONDANSETRON HCL 4 MG/2ML IJ SOLN
INTRAMUSCULAR | Status: AC
  Filled 2016-09-04: qty 2

## 2016-09-04 MED ORDER — DEXAMETHASONE SODIUM PHOSPHATE 4 MG/ML IJ SOLN
INTRAMUSCULAR | Status: AC
Start: 2016-09-04 — End: 2016-09-04
  Filled 2016-09-04: qty 1

## 2016-09-04 MED ORDER — LIDOCAINE HCL (PF) 1 % IJ SOLN
INTRAMUSCULAR | Status: AC
  Filled 2016-09-04: qty 5

## 2016-09-04 SURGICAL SUPPLY — 26 items
ABLATOR ENDOMETRIAL BIPOLAR (ABLATOR) ×3 IMPLANT
BAG HAMPER (MISCELLANEOUS) ×3 IMPLANT
CATH ROBINSON RED A/P 16FR (CATHETERS) ×3 IMPLANT
CLOTH BEACON ORANGE TIMEOUT ST (SAFETY) ×3 IMPLANT
COVER LIGHT HANDLE STERIS (MISCELLANEOUS) ×6 IMPLANT
DECANTER SPIKE VIAL GLASS SM (MISCELLANEOUS) ×3 IMPLANT
FORMALIN 10 PREFIL 120ML (MISCELLANEOUS) ×3 IMPLANT
GLOVE BIOGEL PI IND STRL 7.0 (GLOVE) ×1 IMPLANT
GLOVE BIOGEL PI IND STRL 9 (GLOVE) ×1 IMPLANT
GLOVE BIOGEL PI INDICATOR 7.0 (GLOVE) ×2
GLOVE BIOGEL PI INDICATOR 9 (GLOVE) ×2
GLOVE ECLIPSE 9.0 STRL (GLOVE) ×3 IMPLANT
GOWN SPEC L3 XXLG W/TWL (GOWN DISPOSABLE) ×3 IMPLANT
GOWN STRL REUS W/TWL LRG LVL3 (GOWN DISPOSABLE) ×3 IMPLANT
INST SET HYSTEROSCOPY (KITS) ×3 IMPLANT
IV NS IRRIG 3000ML ARTHROMATIC (IV SOLUTION) ×3 IMPLANT
KIT ROOM TURNOVER AP CYSTO (KITS) ×3 IMPLANT
KIT ROOM TURNOVER APOR (KITS) ×3 IMPLANT
MANIFOLD NEPTUNE II (INSTRUMENTS) ×3 IMPLANT
NS IRRIG 1000ML POUR BTL (IV SOLUTION) ×3 IMPLANT
PACK PERI GYN (CUSTOM PROCEDURE TRAY) ×3 IMPLANT
PAD ARMBOARD 7.5X6 YLW CONV (MISCELLANEOUS) ×3 IMPLANT
PAD TELFA 3X4 1S STER (GAUZE/BANDAGES/DRESSINGS) ×3 IMPLANT
SET BASIN LINEN APH (SET/KITS/TRAYS/PACK) ×3 IMPLANT
SET IRRIG Y TYPE TUR BLADDER L (SET/KITS/TRAYS/PACK) ×3 IMPLANT
SYR CONTROL 10ML LL (SYRINGE) ×3 IMPLANT

## 2016-09-04 NOTE — Addendum Note (Signed)
Addendum  created 09/04/16 1511 by Ollen Bowl, CRNA   Anesthesia Intra Flowsheets edited

## 2016-09-04 NOTE — Op Note (Signed)
Please see the brief operative note for surgical details 

## 2016-09-04 NOTE — Anesthesia Procedure Notes (Signed)
Procedure Name: LMA Insertion Date/Time: 09/04/2016 10:38 AM Performed by: Charmaine Downs Pre-anesthesia Checklist: Patient identified, Patient being monitored, Emergency Drugs available, Timeout performed and Suction available Patient Re-evaluated:Patient Re-evaluated prior to induction Oxygen Delivery Method: Circle System Utilized Preoxygenation: Pre-oxygenation with 100% oxygen Induction Type: IV induction Ventilation: Mask ventilation without difficulty LMA: LMA inserted LMA Size: 4.0 Number of attempts: 1 Placement Confirmation: positive ETCO2 and breath sounds checked- equal and bilateral Tube secured with: Tape Dental Injury: Teeth and Oropharynx as per pre-operative assessment

## 2016-09-04 NOTE — Discharge Instructions (Signed)
Endometrial Ablation Endometrial ablation is a procedure that destroys the thin inner layer of the lining of the uterus (endometrium). This procedure may be done:  To stop heavy periods.  To stop bleeding that is causing anemia.  To control irregular bleeding.  To treat bleeding caused by small tumors (fibroids) in the endometrium.  This procedure is often an alternative to major surgery, such as removal of the uterus and cervix (hysterectomy). As a result of this procedure:  You may not be able to have children. However, if you are premenopausal (you have not gone through menopause): ? You may still have a small chance of getting pregnant. ? You will need to use a reliable method of birth control after the procedure to prevent pregnancy.  You may stop having a menstrual period, or you may have only a small amount of bleeding during your period. Menstruation may return several years after the procedure.  Tell a health care provider about:  Any allergies you have.  All medicines you are taking, including vitamins, herbs, eye drops, creams, and over-the-counter medicines.  Any problems you or family members have had with the use of anesthetic medicines.  Any blood disorders you have.  Any surgeries you have had.  Any medical conditions you have. What are the risks? Generally, this is a safe procedure. However, problems may occur, including:  A hole (perforation) in the uterus or bowel.  Infection of the uterus, bladder, or vagina.  Bleeding.  Damage to other structures or organs.  An air bubble in the lung (air embolus).  Problems with pregnancy after the procedure.  Failure of the procedure.  Decreased ability to diagnose cancer in the endometrium.  What happens before the procedure?  You will have tests of your endometrium to make sure there are no pre-cancerous cells or cancer cells present.  You may have an ultrasound of the uterus.  You may be given  medicines to thin the endometrium.  Ask your health care provider about: ? Changing or stopping your regular medicines. This is especially important if you take diabetes medicines or blood thinners. ? Taking medicines such as aspirin and ibuprofen. These medicines can thin your blood. Do not take these medicines before your procedure if your doctor tells you not to.  Plan to have someone take you home from the hospital or clinic. What happens during the procedure?  You will lie on an exam table with your feet and legs supported as in a pelvic exam.  To lower your risk of infection: ? Your health care team will wash or sanitize their hands and put on germ-free (sterile) gloves. ? Your genital area will be washed with soap.  An IV tube will be inserted into one of your veins.  You will be given a medicine to help you relax (sedative).  A surgical instrument with a light and camera (resectoscope) will be inserted into your vagina and moved into your uterus. This allows your surgeon to see inside your uterus.  Endometrial tissue will be removed using one of the following methods: ? Radiofrequency. This method uses a radiofrequency-alternating electric current to remove the endometrium. ? Cryotherapy. This method uses extreme cold to freeze the endometrium. ? Heated-free liquid. This method uses a heated saltwater (saline) solution to remove the endometrium. ? Microwave. This method uses high-energy microwaves to heat up the endometrium and remove it. ? Thermal balloon. This method involves inserting a catheter with a balloon tip into the uterus. The balloon tip is  filled with heated fluid to remove the endometrium. The procedure may vary among health care providers and hospitals. What happens after the procedure?  Your blood pressure, heart rate, breathing rate, and blood oxygen level will be monitored until the medicines you were given have worn off.  As tissue healing occurs, you may  notice vaginal bleeding for 4-6 weeks after the procedure. You may also experience: ? Cramps. ? Thin, watery vaginal discharge that is light pink or brown in color. ? A need to urinate more frequently than usual. ? Nausea.  Do not drive for 24 hours if you were given a sedative.  Do not have sex or insert anything into your vagina until your health care provider approves. Summary  Endometrial ablation is done to treat the many causes of heavy menstrual bleeding.  The procedure may be done only after medications have been tried to control the bleeding.  Plan to have someone take you home from the hospital or clinic. This information is not intended to replace advice given to you by your health care provider. Make sure you discuss any questions you have with your health care provider. Document Released: 12/16/2003 Document Revised: 02/23/2016 Document Reviewed: 02/23/2016 Elsevier Interactive Patient Education  2017 [employer]   Dilation and Curettage or Vacuum Curettage, Care After This sheet gives you information about how to care for yourself after your procedure. Your health care provider may also give you more specific instructions. If you have problems or questions, contact your health care provider. What can I expect after the procedure? After your procedure, it is common to have:  Mild pain or cramping.  Some vaginal bleeding or spotting.  These may last for up to 2 weeks after your procedure. Follow these instructions at home: Activity   Do not drive or use heavy machinery while taking prescription pain medicine.  Avoid driving for the first 24 hours after your procedure.  Take frequent, short walks, followed by rest periods, throughout the day. Ask your health care provider what activities are safe for you. After 1-2 days, you may be able to return to your normal activities.  Do not lift anything heavier than 10 lb (4.5 kg) until your health care provider  approves.  For at least 2 weeks, or as long as told by your health care provider, do not: ? Douche. ? Use tampons. ? Have sexual intercourse. General instructions   Take over-the-counter and prescription medicines only as told by your health care provider. This is especially important if you take blood thinning medicine.  Do not take baths, swim, or use a hot tub until your health care provider approves. Take showers instead of baths.  Wear compression stockings as told by your health care provider. These stockings help to prevent blood clots and reduce swelling in your legs.  It is your responsibility to get the results of your procedure. Ask your health care provider, or the department performing the procedure, when your results will be ready.  Keep all follow-up visits as told by your health care provider. This is important. Contact a health care provider if:  You have severe cramps that get worse or that do not get better with medicine.  You have severe abdominal pain.  You cannot drink fluids without vomiting.  You develop pain in a different area of your pelvis.  You have bad-smelling vaginal discharge.  You have a rash. Get help right away if:  You have vaginal bleeding that soaks more than one sanitary  pad in 1 hour, for 2 hours in a row.  You pass large blood clots from your vagina.  You have a fever that is above 100.61F (38.0C).  Your abdomen feels very tender or hard.  You have chest pain.  You have shortness of breath.  You cough up blood.  You feel dizzy or light-headed.  You faint.  You have pain in your neck or shoulder area. This information is not intended to replace advice given to you by your health care provider. Make sure you discuss any questions you have with your health care provider. Document Released: 02/03/2000 Document Revised: 10/05/2015 Document Reviewed: 09/08/2015 Elsevier Interactive Patient Education  [employer].

## 2016-09-04 NOTE — Anesthesia Postprocedure Evaluation (Signed)
Anesthesia Post Note  Patient: Kara Gentry  Procedure(s) Performed: Procedure(s) (LRB): HYSTEROSCOPY WITH NOVASURE ABLATION (N/A)  Patient location during evaluation: PACU Anesthesia Type: General Level of consciousness: awake and patient cooperative Pain management: pain level controlled Vital Signs Assessment: post-procedure vital signs reviewed and stable Respiratory status: spontaneous breathing, nonlabored ventilation and respiratory function stable Cardiovascular status: blood pressure returned to baseline Postop Assessment: no signs of nausea or vomiting Anesthetic complications: no     Last Vitals:  Vitals:   09/04/16 1200 09/04/16 1215  BP: 127/82 118/76  Pulse: (!) 107 90  Resp: 20 17  Temp:      Last Pain:  Vitals:   09/04/16 1215  PainSc: 3                  Eiza Canniff J

## 2016-09-04 NOTE — H&P (Signed)
Kara Gentry is an 41 y.o. female. She is here for hysteroscopy, Dilation  And curettage, with Novasure endometrial ablation, to address her heavy menses that are painful and clots present. The Patient has several uterine fibroids, but the endometrium is normal in contour, based on sonohysterogram done in my office, so plans are for endometrial ablation. 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 The patient is aware that a small risk of tubal pregnancy can persist after endometrial ablation. The patient has remained infertile since her post cesarean infection that resulted in necrotizing fasciitis in 2013. 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  Pertinent Gynecological History: Menses: with severe dysmenorrhea and when not controlled by OcP's Bleeding:  Contraception: OCP (estrogen/progesterone) DES exposure: unknown Blood transfusions: none Sexually transmitted diseases: no past history Previous GYN Procedures: cesarean, post cesarean necrotizing fasciitis  Last mammogram: normal Date:  Last pap: normal Date: 05/2016 OB History: G1, P0200   Menstrual History: Menarche age: 72 No LMP recorded. Patient is not currently having periods (Reason: Oral contraceptives).    Past Medical History:  Diagnosis Date  . Anemia   . Bursitis    Right shoulder  . Cancer ([facility] [facility])   . Fibroids 05/31/2016  . Headache   . Hemorrhoids   . PONV (postoperative nausea and vomiting)   . Seasonal allergies   . Vitamin D deficiency 05/23/2016   Take 5000 IU vitamin D3 daily    Past Surgical History:  Procedure Laterality Date  . APPLICATION OF  WOUND VAC  01/03/2012   Procedure: APPLICATION OF WOUND VAC;  Surgeon: Jonnie Kind, MD;  Location: [facility];  Service: Gynecology;  Laterality: N/A;  . APPLICATION OF WOUND VAC  01/05/2012   Procedure: APPLICATION OF WOUND VAC;  Surgeon: Jonnie Kind, MD;  Location: [facility];  Service: Gynecology;  Laterality: N/A;  . APPLICATION OF WOUND VAC  01/07/2012   Procedure: APPLICATION OF WOUND VAC;  Surgeon: Jonnie Kind, MD;  [facility]: [facility];  Service: Gynecology;;  . APPLICATION OF WOUND VAC  01/11/2012   Procedure: APPLICATION OF WOUND VAC;  Surgeon: Jonnie Kind, MD;  [facility]: [facility];  Service: Gynecology;  Laterality: N/A;  Fasicial Closure  . CESAREAN SECTION  12/22/2011   Procedure: CESAREAN SECTION;  Surgeon: Donnamae Jude, MD;  Location: [facility];  Service: Obstetrics;  Laterality: N/A;  Primary cesarean section with delivery of Baby A girl at 61. Baby B girl at 36.  Marland Kitchen ECTOPIC PREGNANCY SURGERY    . INCISION AND DRAINAGE OF WOUND  01/07/2012   Procedure: IRRIGATION AND DEBRIDEMENT WOUND;  Surgeon: Jonnie Kind, MD;  Location: [facility];  Service: Gynecology;;  . INSERTION OF MESH  01/03/2012   Procedure: INSERTION OF MESH;  Surgeon: Jonnie Kind, MD;  [facility]: [facility];  Service: Gynecology;  Laterality: N/A;  . MANDIBLE SURGERY Right cancer   Cancerous mass  . NASAL SINUS SURGERY    . SECONDARY CLOSURE OF WOUND  01/22/2012   Procedure: SECONDARY CLOSURE OF WOUND;  Surgeon: Jonnie Kind, MD;  [facility]: [facility];  Service: Gynecology;  Laterality: N/A;  . WOUND EXPLORATION  01/03/2012   Necrotizing fascitis at C section incision  .  WOUND EXPLORATION  01/05/2012   Procedure: WOUND EXPLORATION;  Surgeon: Jonnie Kind, MD;  [facility]: [facility];  Service: Gynecology;  Laterality: N/A;  Re-Exploration of Wound Abscess with Debridement  . WOUND EXPLORATION  01/07/2012   Procedure: WOUND EXPLORATION;  Surgeon: Jonnie Kind, MD;  [facility]: [facility];  Service: Gynecology;;  . WOUND  EXPLORATION  01/11/2012   Procedure: WOUND EXPLORATION;  Surgeon: Jonnie Kind, MD;  [facility];  Service: Gynecology;  Laterality: N/A;  Debridment of Wound Abscess  . WOUND EXPLORATION  01/22/2012   Procedure: WOUND EXPLORATION;  Surgeon: Jonnie Kind, MD;  [facility]: [facility];  Service: Gynecology;  Laterality: N/A;  Delayed Secondary Wound Closure  . WRIST SURGERY Left    Cyst removal    Family History  Problem Relation Age of Onset  . Cancer Maternal Grandfather        prostate  . Diabetes Mother   . Arthritis Mother   . Cancer Paternal Uncle   . Cancer Maternal Uncle        lung  . Diabetes Maternal Aunt   . Arthritis Maternal Aunt     Social History:  reports that she has never smoked. She has never used smokeless tobacco. She reports that she does not drink alcohol or use drugs.  Allergies: No Known Allergies  Prescriptions Prior to Admission  Medication Sig Dispense Refill Last Dose  . aspirin-acetaminophen-caffeine (EXCEDRIN MIGRAINE) 250-250-65 MG tablet Take 1-2 tablets by mouth daily as needed for headache.    Past Month at Unknown time  . Norethindrone Acetate-Ethinyl Estrad-FE (LOESTRIN 24 FE) 1-20 MG-MCG(24) tablet Take 1 tablet by mouth daily. Patient to take continuous active pills and skip the placebo pills. 1 Package 11 09/03/2016 at Unknown time  . SUMAtriptan (IMITREX) 50 MG tablet Take 50 mg by mouth daily as needed for migraine.    Past Month at Unknown time    ROS  Blood pressure 118/83, pulse 71, temperature 98.2 F (36.8 C), resp. rate 18, SpO2 97 %. Physical Exam  Constitutional: She is oriented to person, place, and time. She appears well-developed and well-nourished.  HENT:  Head: Normocephalic and atraumatic.  Eyes: Pupils are equal, round, and reactive to light.  Neck: Normal range of motion.  Cardiovascular: Normal rate and regular rhythm.   Respiratory: Effort normal.  GI: Soft.  Genitourinary: Vagina normal.  Genitourinary  Comments: Enlarged, 8 wk size, normal endometrium on Sonohysterogram  Neurological: She is alert and oriented to person, place, and time.  Skin: Skin is warm and dry.  Psychiatric: She has a normal mood and affect. Her behavior is normal.    No results found for this or any previous visit (from the past 24 hour(s)).  No results found.  Assessment/Plan: Menorrhagia, uterine fibroids not deforming endometrium Pelvic adhesions s/p necrotizing fasciitis 2013  Plan :  Hysteroscopy Dilation and Curettage, endometrial ablation.  Natasia Sanko V 09/04/2016, 9:43 AM

## 2016-09-04 NOTE — Anesthesia Preprocedure Evaluation (Signed)
Anesthesia Evaluation  Patient identified by MRN, date of birth, ID band Patient awake    Reviewed: Allergy & Precautions, H&P , NPO status , Patient's Chart, lab work & pertinent test results  History of Anesthesia Complications (+) PONV and history of anesthetic complications  Airway Mallampati: II   Neck ROM: Full    Dental  (+) Teeth Intact   Pulmonary neg pulmonary ROS,    breath sounds clear to auscultation       Cardiovascular negative cardio ROS   Rhythm:Regular     Neuro/Psych  Headaches,    GI/Hepatic GERD  Controlled and Medicated,  Endo/Other    Renal/GU      Musculoskeletal   Abdominal   Peds  Hematology  (+) anemia ,   Anesthesia Other Findings   Reproductive/Obstetrics Csection [redacted]weeks gestation; transverse lie                             Anesthesia Physical Anesthesia Plan  ASA: II  Anesthesia Plan: General   Post-op Pain Management:    Induction: Intravenous  PONV Risk Score and Plan:   Airway Management Planned: LMA  Additional Equipment:   Intra-op Plan:   Post-operative Plan: Extubation in OR  Informed Consent: I have reviewed the patients History and Physical, chart, labs and discussed the procedure including the risks, benefits and alternatives for the proposed anesthesia with the patient or authorized representative who has indicated his/her understanding and acceptance.     Plan Discussed with:   Anesthesia Plan Comments:         Anesthesia Quick Evaluation

## 2016-09-04 NOTE — Transfer of Care (Signed)
Immediate Anesthesia Transfer of Care Note  Patient: Kara Gentry  Procedure(s) Performed: Procedure(s): HYSTEROSCOPY WITH NOVASURE ABLATION (N/A)  Patient Location: PACU  Anesthesia Type:General  Level of Consciousness: awake and patient cooperative  Airway & Oxygen Therapy: Patient Spontanous Breathing and Patient connected to face mask oxygen  Post-op Assessment: Report given to RN, Post -op Vital signs reviewed and stable and Patient moving all extremities  Post vital signs: Reviewed and stable  Last Vitals:  Vitals:   09/04/16 0955 09/04/16 1000  BP: (!) 134/91 130/80  Pulse:    Resp: 18 (!) 22  Temp:      Last Pain: There were no vitals filed for this visit.       Complications: No apparent anesthesia complications

## 2016-09-04 NOTE — Brief Op Note (Signed)
09/04/2016  11:21 AM  PATIENT:  Kara Gentry  41 y.o. female   PRE-OPERATIVE DIAGNOSIS:  Pelvic Pain, menorrhagia  POST-OPERATIVE DIAGNOSIS:  Pelvic Pain, menorrhagia  PROCEDURE:  Procedure(s): HYSTEROSCOPY WITH NOVASURE ABLATION (N/A)  SURGEON:  Surgeon(s) and Role:    * Jonnie Kind, MD - Primary  PHYSICIAN ASSISTANT:   ASSISTANTS: none   ANESTHESIA:   local and general  EBL:  Total I/O In: 500 [I.V.:500] Out: 15 [Blood:15]  BLOOD ADMINISTERED:none  DRAINS: none   LOCAL MEDICATIONS USED:  MARCAINE    and Amount: 30 ml  SPECIMEN:  No Specimen  DISPOSITION OF SPECIMEN:  N/A  COUNTS:  YES  TOURNIQUET:  * No tourniquets in log *  DICTATION: .Dragon Dictation  PLAN OF CARE: Discharge to home after PACU  PATIENT DISPOSITION:  PACU - hemodynamically stable.   Delay start of Pharmacological VTE agent (>24hrs) due to surgical blood loss or risk of bleeding: not applicable Details of procedure patient was taken operating room prepped and draped for vaginal procedure with timeout conducted and Ancef administered and procedure confirmed by surgical team. The cervix was grasped with single-tooth tenaculum. There was a nabothian cyst on the cervix which was popped during this process. Cervix was placed on traction, uterus sounded to 11 cm, dilated to 25 [place] allowing introduction of the NovaSure endometrial device. Paracervical block was applied and 30 cc of Marcaine with epinephrine injected at 3, 5, 7 and 9:00 The uterus was first sounded and the cavity measured 6 cm, the NovaSure endometrial ablation device repaired inserted and expanded to 4.3 cm and the incision sequence activated and completed. Postprocedure hysteroscopy a confirmed satisfactory thermal changes in the endometrial cavity with normal-appearing canal. There was absolutely no tissue in the endometrial cavity to be curetted. Hysteroscopy was repeated to confirm satisfactory thermal changes and  photodocumentation picture #5 shows this vision then went to recovery room in stable condition with minimal EBL

## 2016-09-05 ENCOUNTER — Encounter (HOSPITAL_COMMUNITY): Payer: Self-pay | Admitting: Obstetrics and Gynecology

## 2016-09-12 ENCOUNTER — Encounter: Payer: Self-pay | Admitting: Obstetrics and Gynecology

## 2016-09-12 ENCOUNTER — Ambulatory Visit (INDEPENDENT_AMBULATORY_CARE_PROVIDER_SITE_OTHER): Payer: BC Managed Care – PPO | Admitting: Obstetrics and Gynecology

## 2016-09-12 VITALS — BP 118/66 | HR 81 | Wt 184.0 lb

## 2016-09-12 DIAGNOSIS — N920 Excessive and frequent menstruation with regular cycle: Secondary | ICD-10-CM

## 2016-09-12 DIAGNOSIS — Z09 Encounter for follow-up examination after completed treatment for conditions other than malignant neoplasm: Secondary | ICD-10-CM

## 2016-09-12 DIAGNOSIS — Z9889 Other specified postprocedural states: Secondary | ICD-10-CM

## 2016-09-13 NOTE — Progress Notes (Signed)
   Subjective:  Kara Gentry is a 41 y.o. female now 2 weeks status post operative hysteroscopy and With endometrial ablation.    Patient doing well no fevers had minimal discharge post procedure Review of Systems Negative except    Diet:   Regular   Bowel movements : normal.  The patient is not having any pain.  Objective:  BP 118/66 (BP Location: Right Arm, Patient Position: Sitting, Cuff Size: Normal)   Pulse 81   Wt 184 lb (83.5 kg)   BMI 32.59 kg/m  General:Well developed, well nourished.  No acute distress. Abdomen: Bowel sounds normal, soft, non-tender. Pelvic Exam:    External Genitalia:  Normal.    Vagina: Normal    Cervix: Normal Uterus: Normal    Adnexa/Bimanual: Normal  Incision(s):   HNgo incision  Assessment:  Post-Op 2 weeks s/p operative hysteroscopy   With endometrial ablation Doing well postoperatively.   Plan:  1.Wound care discussed   2. . current medications. 3. Activity restrictions: none 4. return to work: not applicable. 5. Follow up in Here in as needed week.

## 2016-12-19 ENCOUNTER — Encounter: Payer: Self-pay | Admitting: Obstetrics and Gynecology

## 2016-12-19 ENCOUNTER — Ambulatory Visit (INDEPENDENT_AMBULATORY_CARE_PROVIDER_SITE_OTHER): Payer: BC Managed Care – PPO | Admitting: Obstetrics and Gynecology

## 2016-12-19 VITALS — BP 112/68 | HR 70 | Ht 62.0 in | Wt 184.6 lb

## 2016-12-19 DIAGNOSIS — R102 Pelvic and perineal pain unspecified side: Secondary | ICD-10-CM | POA: Insufficient documentation

## 2016-12-19 NOTE — Progress Notes (Signed)
[facility] Visit  12/19/2016            Patient name: Kara Gentry MRN [id number]  Date of birth: 08/29/1975  CC & HPI:  Kara Gentry is a 41 y.o. female presenting today for moderate intermittent pelvic pain with onset of about 3 months ago. She has associated symptoms of spotting about once a month. No alleviating factors noted. Pt has not tried any medications for relief. Her pain occurs a few times during the month, and began right after her ablation on 09/04/16. Her spotting lasts 2-3 days, and is not a major problem to her. She had a hysteroscopy with Novasure ablation on 09/04/16 due to pelvic pain and menorrhagia. She believes her pelvic pain may occur on a cycle.  ROS:  ROS  +pelvic pain +vaginal spotting All systems are negative except as noted in the HPI and PMH.    Pertinent History Reviewed:   Reviewed: Significant for fibroids, hemorrhoids, C-section, D&C  Medical         Past Medical History:  Diagnosis Date  . Anemia   . Bursitis    Right shoulder  . Cancer ([place])   . Fibroids 05/31/2016  . Headache   . Hemorrhoids   . PONV (postoperative nausea and vomiting)   . Seasonal allergies   . Vitamin D deficiency 05/23/2016   Take 5000 IU vitamin D3 daily                              Surgical Hx:    Past Surgical History:  Procedure Laterality Date  . APPLICATION OF WOUND VAC  01/03/2012   Procedure: APPLICATION OF WOUND VAC;  Surgeon: Jonnie Kind, MD;  Location: [facility];  Service: Gynecology;  Laterality: N/A;  . APPLICATION OF WOUND VAC  01/05/2012   Procedure: APPLICATION OF WOUND VAC;  Surgeon: Jonnie Kind, MD;  Location: [facility];  Service: Gynecology;  Laterality: N/A;  . APPLICATION OF WOUND VAC  01/07/2012   Procedure: APPLICATION OF WOUND VAC;  Surgeon: Jonnie Kind, MD;  [facility]: [facility];  Service: Gynecology;;  . APPLICATION OF WOUND VAC  01/11/2012   Procedure: APPLICATION OF WOUND VAC;  Surgeon: Jonnie Kind, MD;  [facility];  Service: Gynecology;  Laterality: N/A;  Fasicial Closure  . CESAREAN SECTION  12/22/2011   Procedure: CESAREAN SECTION;  Surgeon: Donnamae Jude, MD;  Location: [facility];  Service: Obstetrics;  Laterality: N/A;  Primary cesarean section with delivery of Baby A girl at 87. Baby B girl at 74.  Marland Kitchen DILITATION & CURRETTAGE/HYSTROSCOPY WITH NOVASURE ABLATION N/A 09/04/2016   Procedure: HYSTEROSCOPY WITH NOVASURE ABLATION;  Surgeon: Jonnie Kind, MD;  Location: [facility];  Service: Gynecology;  Laterality: N/A;  . ECTOPIC PREGNANCY SURGERY    . INCISION AND DRAINAGE OF WOUND  01/07/2012   Procedure: IRRIGATION AND DEBRIDEMENT WOUND;  Surgeon: Jonnie Kind, MD;  Location: [facility];  Service: Gynecology;;  . INSERTION OF MESH  01/03/2012   Procedure: INSERTION OF MESH;  Surgeon: Jonnie Kind, MD;  [facility];  Service: Gynecology;  Laterality: N/A;  . MANDIBLE SURGERY Right cancer   Cancerous mass  . NASAL SINUS SURGERY    . SECONDARY CLOSURE OF WOUND  01/22/2012   Procedure: SECONDARY CLOSURE OF WOUND;  Surgeon: Jonnie Kind, MD;  Location: [facility];  Service: Gynecology;  Laterality: N/A;  .  WOUND EXPLORATION  01/03/2012   Necrotizing fascitis at C section incision  . WOUND EXPLORATION  01/05/2012   Procedure: WOUND EXPLORATION;  Surgeon: Jonnie Kind, MD;  [facility]: [facility];  Service: Gynecology;  Laterality: N/A;  Re-Exploration of Wound Abscess with Debridement  . WOUND EXPLORATION  01/07/2012   Procedure: WOUND EXPLORATION;  Surgeon: Jonnie Kind, MD;  [facility]: [facility];  Service: Gynecology;;  . WOUND EXPLORATION  01/11/2012   Procedure: WOUND EXPLORATION;  Surgeon: Jonnie Kind, MD;  [facility];  Service: Gynecology;  Laterality: N/A;  Debridment of Wound Abscess  . WOUND EXPLORATION  01/22/2012   Procedure: WOUND EXPLORATION;  Surgeon: Jonnie Kind, MD;  [facility]: [facility];  Service: Gynecology;  Laterality: N/A;  Delayed Secondary Wound Closure  . WRIST SURGERY  Left    Cyst removal   Medications: Reviewed & Updated - see associated section                       Current Outpatient Prescriptions:  .  aspirin-acetaminophen-caffeine (EXCEDRIN MIGRAINE) 250-250-65 MG tablet, Take 1-2 tablets by mouth daily as needed for headache. , Disp: , Rfl:  .  SUMAtriptan (IMITREX) 50 MG tablet, Take 50 mg by mouth daily as needed for migraine. , Disp: , Rfl:    Social History: Reviewed -  reports that she has never smoked. She has never used smokeless tobacco.  Objective Findings:  Vitals: Blood pressure 112/68, pulse 70, height 5\' 2"  (1.575 m), weight 184 lb 9.6 oz (83.7 kg).  Physical Examination: General appearance - alert, well appearing, and in no distress Mental status - alert, oriented to person, place, and time Pelvic -  VULVA: normal appearing vulva with no masses, tenderness or lesions,  VAGINA: normal appearing vagina with normal color and discharge, no lesions,  CERVIX: normal appearing cervix without discharge or lesions, pointed slightly to the right UTERUS: uterus is normal size, shape, consistency and nontender, mobile ADNEXA: normal adnexa in size, nontender and no masses   Assessment & Plan:   A:  1. Pelvic pain minor 2 no acute abnormalities s/p endometrial ablation  P:  1. Use an app to track pelvic pain for 2-3 month compare with menses 2.  F/u PRN   By signing my name below, I, Kara Gentry, attest that this documentation has been prepared under the direction and in the presence of Jonnie Kind, MD. Electronically Signed: Jabier Gauss, Medical Scribe. 12/19/16. 3:18 PM.  I personally performed the services described in this documentation, which was SCRIBED in my presence. The recorded information has been reviewed and considered accurate. It has been edited as necessary during review. Jonnie Kind, MD

## 2016-12-24 ENCOUNTER — Ambulatory Visit: Payer: BC Managed Care – PPO | Admitting: Obstetrics and Gynecology

## 2017-05-06 ENCOUNTER — Encounter (HOSPITAL_COMMUNITY): Payer: Self-pay | Admitting: Emergency Medicine

## 2017-05-06 ENCOUNTER — Emergency Department (HOSPITAL_COMMUNITY): Payer: BC Managed Care – PPO

## 2017-05-06 ENCOUNTER — Emergency Department (HOSPITAL_COMMUNITY)
Admission: EM | Admit: 2017-05-06 | Discharge: 2017-05-06 | Disposition: A | Discharge status: Home / Self Care | Payer: BC Managed Care – PPO | Attending: Emergency Medicine | Admitting: Emergency Medicine

## 2017-05-06 ENCOUNTER — Other Ambulatory Visit: Payer: Self-pay

## 2017-05-06 DIAGNOSIS — Y999 Unspecified external cause status: Secondary | ICD-10-CM | POA: Diagnosis not present

## 2017-05-06 DIAGNOSIS — Y9301 Activity, walking, marching and hiking: Secondary | ICD-10-CM | POA: Insufficient documentation

## 2017-05-06 DIAGNOSIS — S82891A Other fracture of right lower leg, initial encounter for closed fracture: Secondary | ICD-10-CM

## 2017-05-06 DIAGNOSIS — Y92094 Garage of other non-institutional residence as the place of occurrence of the external cause: Secondary | ICD-10-CM | POA: Diagnosis not present

## 2017-05-06 DIAGNOSIS — S99911A Unspecified injury of right ankle, initial encounter: Secondary | ICD-10-CM | POA: Diagnosis present

## 2017-05-06 DIAGNOSIS — W108XXA Fall (on) (from) other stairs and steps, initial encounter: Secondary | ICD-10-CM | POA: Diagnosis not present

## 2017-05-06 DIAGNOSIS — S82841A Displaced bimalleolar fracture of right lower leg, initial encounter for closed fracture: Secondary | ICD-10-CM | POA: Diagnosis not present

## 2017-05-06 MED ORDER — FENTANYL CITRATE (PF) 100 MCG/2ML IJ SOLN
50.0000 ug | Freq: Once | INTRAMUSCULAR | Status: AC
  Administered 2017-05-06: 50 ug via INTRAVENOUS
  Filled 2017-05-06: qty 2

## 2017-05-06 MED ORDER — PROPOFOL 10 MG/ML IV BOLUS
INTRAVENOUS | Status: AC
  Filled 2017-05-06: qty 20

## 2017-05-06 MED ORDER — ONDANSETRON HCL 4 MG/2ML IJ SOLN
INTRAMUSCULAR | Status: AC
  Filled 2017-05-06: qty 2

## 2017-05-06 MED ORDER — OXYCODONE-ACETAMINOPHEN 5-325 MG PO TABS: 1.0000 | ORAL_TABLET | ORAL | 0 refills | Status: DC | PRN

## 2017-05-06 MED ORDER — ONDANSETRON HCL 4 MG/2ML IJ SOLN
4.0000 mg | Freq: Once | INTRAMUSCULAR | Status: AC
  Administered 2017-05-06: 4 mg via INTRAVENOUS

## 2017-05-06 MED ORDER — OXYCODONE-ACETAMINOPHEN 5-325 MG PO TABS
1.0000 | ORAL_TABLET | Freq: Once | ORAL | Status: AC
  Administered 2017-05-06: 1 via ORAL
  Filled 2017-05-06: qty 1

## 2017-05-06 MED ORDER — PROPOFOL 10 MG/ML IV BOLUS
INTRAVENOUS | Status: AC | PRN
  Administered 2017-05-06: 100 mg via INTRAVENOUS

## 2017-05-06 NOTE — ED Provider Notes (Signed)
[facility] EMERGENCY DEPARTMENT Provider Note   CSN: [id number] Arrival date & time: 05/06/17  9622     History   Chief Complaint Chief Complaint  Patient presents with  . Leg Injury    HPI Kara Gentry is a 42 y.o. female.  HPI Patient states that while going down steps into the garage she missed the last step and fell onto her right ankle.  She denies any head or neck injury.  She denies loss of consciousness.  She complains of pain only at the right ankle.  Denies knee pain or hip pain.  Denies back pain.  Patient is not on blood thinners.  No preceding lightheadedness or dizziness.  No chest pain at any point.  Noted to have right ankle deformity by EMS in route.  Given 100 mcg of fentanyl.  Patient last ate this morning at 630.  No previous medication allergies. Past Medical History:  Diagnosis Date  . Anemia   . Bursitis    Right shoulder  . Cancer ([place])   . Fibroids 05/31/2016  . Headache   . Hemorrhoids   . PONV (postoperative nausea and vomiting)   . Seasonal allergies   . Vitamin D deficiency 05/23/2016   Take 5000 IU vitamin D3 daily    Patient Active Problem List   Diagnosis Date Noted  . Pelvic pain in female 12/19/2016  . Menorrhagia with regular cycle 09/04/2016  . Fibroids 05/31/2016  . Vitamin D deficiency 05/23/2016  . Pelvic pain 05/21/2016    Past Surgical History:  Procedure Laterality Date  . APPLICATION OF WOUND VAC  01/03/2012   Procedure: APPLICATION OF WOUND VAC;  Surgeon: Jonnie Kind, MD;  Location: [facility];  Service: Gynecology;  Laterality: N/A;  . APPLICATION OF WOUND VAC  01/05/2012   Procedure: APPLICATION OF WOUND VAC;  Surgeon: Jonnie Kind, MD;  Location: [facility];  Service: Gynecology;  Laterality: N/A;  . APPLICATION OF WOUND VAC  01/07/2012   Procedure: APPLICATION OF WOUND VAC;  Surgeon: Jonnie Kind, MD;  Location: [facility];  Service: Gynecology;;  . APPLICATION OF WOUND VAC  01/11/2012   Procedure: APPLICATION OF  WOUND VAC;  Surgeon: Jonnie Kind, MD;  Location: [facility];  Service: Gynecology;  Laterality: N/A;  Fasicial Closure  . CESAREAN SECTION  12/22/2011   Procedure: CESAREAN SECTION;  Surgeon: Donnamae Jude, MD;  Location: [place] [facility];  Service: Obstetrics;  Laterality: N/A;  Primary cesarean section with delivery of Baby A girl at 90. Baby B girl at 106.  Marland Kitchen DILITATION & CURRETTAGE/HYSTROSCOPY WITH NOVASURE ABLATION N/A 09/04/2016   Procedure: HYSTEROSCOPY WITH NOVASURE ABLATION;  Surgeon: Jonnie Kind, MD;  Location: [facility];  Service: Gynecology;  Laterality: N/A;  . ECTOPIC PREGNANCY SURGERY    . INCISION AND DRAINAGE OF WOUND  01/07/2012   Procedure: IRRIGATION AND DEBRIDEMENT WOUND;  Surgeon: Jonnie Kind, MD;  Location: [facility];  Service: Gynecology;;  . INSERTION OF MESH  01/03/2012   Procedure: INSERTION OF MESH;  Surgeon: Jonnie Kind, MD;  Location: [facility];  Service: Gynecology;  Laterality: N/A;  . MANDIBLE SURGERY Right cancer   Cancerous mass  . NASAL SINUS SURGERY    . SECONDARY CLOSURE OF WOUND  01/22/2012   Procedure: SECONDARY CLOSURE OF WOUND;  Surgeon: Jonnie Kind, MD;  Location: [facility];  Service: Gynecology;  Laterality: N/A;  . WOUND EXPLORATION  01/03/2012   Necrotizing fascitis at C section incision  .  WOUND EXPLORATION  01/05/2012   Procedure: WOUND EXPLORATION;  Surgeon: Jonnie Kind, MD;  Location: [facility];  Service: Gynecology;  Laterality: N/A;  Re-Exploration of Wound Abscess with Debridement  . WOUND EXPLORATION  01/07/2012   Procedure: WOUND EXPLORATION;  Surgeon: Jonnie Kind, MD;  Location: [facility];  Service: Gynecology;;  . WOUND EXPLORATION  01/11/2012   Procedure: WOUND EXPLORATION;  Surgeon: Jonnie Kind, MD;  Location: [facility];  Service: Gynecology;  Laterality: N/A;  Debridment of Wound Abscess  . WOUND EXPLORATION  01/22/2012   Procedure: WOUND EXPLORATION;  Surgeon: Jonnie Kind, MD;  Location: [facility];  Service: Gynecology;  Laterality:  N/A;  Delayed Secondary Wound Closure  . WRIST SURGERY Left    Cyst removal    OB History    Gravida Para Term Preterm AB Living   3 1 0 1 2 0   SAB TAB Ectopic Multiple Live Births   2     1 2        Home Medications    Prior to Admission medications   Medication Sig Start Date End Date Taking? Authorizing Provider  aspirin-acetaminophen-caffeine (EXCEDRIN MIGRAINE) [phone] MG tablet Take 1-2 tablets by mouth daily as needed for headache.    Yes [provider]  SUMAtriptan (IMITREX) 50 MG tablet Take 50 mg by mouth daily as needed for migraine.  08/07/16  Yes [provider]  oxyCODONE-acetaminophen (PERCOCET) 5-325 MG tablet Take 1-2 tablets by mouth every 4 (four) hours as needed for severe pain. 05/06/17   Julianne Rice, MD    Family History Family History  Problem Relation Age of Onset  . Cancer Maternal Grandfather        prostate  . Diabetes Mother   . Arthritis Mother   . Cancer Paternal Uncle   . Cancer Maternal Uncle        lung  . Diabetes Maternal Aunt   . Arthritis Maternal Aunt     Social History Social History   Tobacco Use  . Smoking status: Never Smoker  . Smokeless tobacco: Never Used  Substance Use Topics  . Alcohol use: No  . Drug use: No     Allergies   Patient has no known allergies.   Review of Systems Review of Systems  HENT: Negative for facial swelling.   Eyes: Negative for visual disturbance.  Respiratory: Negative for shortness of breath.   Cardiovascular: Negative for chest pain.  Gastrointestinal: Negative for abdominal pain, nausea and vomiting.  Musculoskeletal: Positive for arthralgias. Negative for back pain and neck pain.  Skin: Negative for rash and wound.  Neurological: Negative for dizziness, syncope, weakness, numbness and headaches.  All other systems reviewed and are negative.    Physical Exam Updated Vital Signs BP 123/84   Pulse 78   Temp 98 F (36.7 C) (Oral)   Resp 19   Ht 5'  2" (1.575 m)   Wt 83.9 kg (185 lb)   SpO2 96%   BMI 33.84 kg/m   Physical Exam  Constitutional: She is oriented to person, place, and time. She appears well-developed and well-nourished.  HENT:  Head: Normocephalic and atraumatic.  Mouth/Throat: Oropharynx is clear and moist.  No evidence of any scalp trauma.  Midface is stable.  No malocclusion.  No intraoral trauma.  Eyes: EOM are normal. Pupils are equal, round, and reactive to light.  Neck: Normal range of motion. Neck supple.  No posterior midline cervical tenderness to palpation.  Cardiovascular: Normal rate  and regular rhythm. Exam reveals no gallop and no friction rub.  No murmur heard. Pulmonary/Chest: Effort normal and breath sounds normal. No stridor. No respiratory distress. She has no wheezes. She has no rales. She exhibits no tenderness.  Abdominal: Soft. Bowel sounds are normal. There is no tenderness. There is no rebound and no guarding.  Musculoskeletal: Normal range of motion. She exhibits tenderness and deformity. She exhibits no edema.   Lateral deformity of the right foot with respect to the ankle. Patient has tenting of the skin over the medial malleolus of the right ankle.  2+ dorsalis pedis pulses.  Patient has mildly diminished distal cap refill.  No proximal fibular tenderness to palpation.  No right knee swelling, deformity or tenderness.  Pelvis is stable.  No midline thoracic or lumbar tenderness.  Neurological: She is alert and oriented to person, place, and time.  Patient is awake and alert.  Does not appear to be in obvious distress.  She is able to follow commands.  Moves all extremities without focal deficit.  Some limitation of movement of the right lower extremity due to injury.  Sensation is intact.  Skin: Skin is warm and dry. Capillary refill takes less than 2 seconds. No rash noted. No erythema.  Psychiatric: She has a normal mood and affect. Her behavior is normal.  Nursing note and vitals  reviewed.    ED Treatments / Results  Labs (all labs ordered are listed, but only abnormal results are displayed) Labs Reviewed - No data to display  EKG  EKG Interpretation None       Radiology Dg Ankle Complete Right  Result Date: 05/06/2017 CLINICAL DATA:  Status post ankle fracture and interval reduction EXAM: RIGHT ANKLE - COMPLETE 3+ VIEW COMPARISON:  None. FINDINGS: Oblique distal fibular metaphysis fracture with 4 mm of lateral displacement. Transverse medial malleolar fracture with 6 mm of lateral displacement. Mild lateral subluxation of the talar dome relative to the tibial plafond. Widening of the medial tibiotalar joint. No ankle dislocation. No other fracture. IMPRESSION: Displaced medial and lateral malleolar fractures without dislocation. Electronically Signed   By: Kathreen Devoid   On: 05/06/2017 09:25    Procedures .Sedation Date/Time: 05/06/2017 8:18 AM Performed by: Julianne Rice, MD Authorized by: Julianne Rice, MD   Consent:    Consent obtained:  Verbal and written   Consent given by:  Patient   Risks discussed:  Inadequate sedation, nausea, prolonged hypoxia resulting in organ damage, respiratory compromise necessitating ventilatory assistance and intubation and vomiting   Alternatives discussed:  Analgesia without sedation, anxiolysis and regional anesthesia Universal protocol:    Procedure explained and questions answered to patient or proxy's satisfaction: yes     Site/side marked: yes     Immediately prior to procedure a time out was called: yes     Patient identity confirmation method:  Verbally with patient and arm band Indications:    Procedure performed:  Fracture reduction   Procedure necessitating sedation performed by:  Physician performing sedation   Intended level of sedation:  Deep and moderate (conscious sedation) Pre-sedation assessment:    Time since last food or drink:  120 min   NPO status caution: urgency dictates proceeding  with non-ideal NPO status     ASA classification: class 1 - normal, healthy patient     Neck mobility: normal     Mouth opening:  3 or more finger widths   Thyromental distance:  4 finger widths   Mallampati score:  I -  soft palate, uvula, fauces, pillars visible   Pre-sedation assessments completed and reviewed: airway patency, cardiovascular function, hydration status, mental status, nausea/vomiting, pain level, respiratory function and temperature   Immediate pre-procedure details:    Reassessment: Patient reassessed immediately prior to procedure     Reviewed: vital signs, relevant labs/tests and NPO status     Verified: bag valve mask available, emergency equipment available, intubation equipment available, IV patency confirmed, oxygen available and suction available   Procedure details (see MAR for exact dosages):    Preoxygenation:  Nasal cannula   Sedation:  Propofol   Intra-procedure monitoring:  Blood pressure monitoring, cardiac monitor, continuous pulse oximetry, frequent LOC assessments and frequent vital sign checks   Intra-procedure events: none     Total Provider sedation time (minutes):  20 Post-procedure details:    Attendance: Constant attendance by certified staff until patient recovered     Recovery: Patient returned to pre-procedure baseline     Post-sedation assessments completed and reviewed: airway patency, cardiovascular function, hydration status, mental status, nausea/vomiting, pain level, respiratory function and temperature     Patient is stable for discharge or admission: yes     Patient tolerance:  Tolerated well, no immediate complications Reduction of fracture Date/Time: 05/06/2017 8:18 AM Performed by: Julianne Rice, MD Authorized by: Julianne Rice, MD  Consent: Verbal consent obtained. Written consent obtained. Risks and benefits: risks, benefits and alternatives were discussed Consent given by: patient Patient understanding: patient states  understanding of the procedure being performed Patient consent: the patient's understanding of the procedure matches consent given Procedure consent: procedure consent matches procedure scheduled Site marked: the operative site was marked Patient identity confirmed: verbally with patient and arm band Time out: Immediately prior to procedure a "time out" was called to verify the correct patient, procedure, equipment, support staff and site/side marked as required. Local anesthesia used: no  Anesthesia: Local anesthesia used: no  Sedation: Patient sedated: yes Sedatives: propofol Sedation start date/time: 05/06/2017 8:18 AM Sedation end date/time: 05/06/2017 8:38 AM Vitals: Vital signs were monitored during sedation.  Patient tolerance: Patient tolerated the procedure well with no immediate complications Comments: Successful reduction of right ankle.  Dorsalis pedis pulse remains 2+.  Good distal cap refill.  Placed in short leg posterior splint with stirrups.  Good distal cap refill after splint placed.  Patient tolerated well.  Marland KitchenSplint Application Date/Time: 5/36/1443 8:18 AM Performed by: Julianne Rice, MD Authorized by: Julianne Rice, MD   Pre-procedure details:    Sensation:  Normal Procedure details:    Laterality:  Right   Location:  Ankle   Ankle:  R ankle   Strapping: yes     Splint type:  Short leg and ankle stirrup   Supplies:  Ortho-Glass, elastic bandage and cotton padding Post-procedure details:    Pain:  Improved   Sensation:  Normal   Skin color:  Normal   Patient tolerance of procedure:  Tolerated well, no immediate complications   (including critical care time)  Medications Ordered in ED Medications  oxyCODONE-acetaminophen (PERCOCET/ROXICET) 5-325 MG per tablet 1 tablet (not administered)  ondansetron (ZOFRAN) injection 4 mg (4 mg Intravenous Given 05/06/17 0757)  propofol (DIPRIVAN) 10 mg/mL bolus/IV push (100 mg Intravenous Given 05/06/17 0818)   fentaNYL (SUBLIMAZE) injection 50 mcg (50 mcg Intravenous Given 05/06/17 0902)     Initial Impression / Assessment and Plan / ED Course  I have reviewed the triage vital signs and the nursing notes.  Pertinent labs & imaging results that were available  during my care of the patient were reviewed by me and considered in my medical decision making (see chart for details).     Given skin tenting and concern for vascular compromise we will proceed with conscious sedation and reduction of the patient's right ankle.  Patient understands the risk involved with sedation especially given recent food intake.  She is awake and alert.  She appears competent to make decisions regarding her care.  Informed consent obtained. Postreduction films obtained.  Dislocation is reduced the patient does have some persistent malalignment.  Dr. Aline Brochure reviewed patient's x-rays.  Will see patient in his office tomorrow at 4 PM.  Patient given crutches and informed not to bear weight on her right ankle.  Keep it elevated to help reduce swelling.  Given strict return precautions and has voiced understanding. Final Clinical Impressions(s) / ED Diagnoses   Final diagnoses:  Closed fracture dislocation of right ankle, initial encounter    ED Discharge Orders        Ordered    oxyCODONE-acetaminophen (PERCOCET) 5-325 MG tablet  Every 4 hours PRN     05/06/17 1036       Julianne Rice, MD 05/06/17 1041

## 2017-05-06 NOTE — Discharge Instructions (Signed)
Do not bear weight on your right leg.  Keep leg elevated above your heart.  Take pain medication as needed.  Return to the emergency department for uncontrolled pain, worsening swelling, numbness or tingling to her toes.  Follow-up with Dr. Aline Brochure tomorrow in his office at 4 PM.

## 2017-05-06 NOTE — ED Triage Notes (Signed)
Per EMS from home. Patient fell down steps in garage going to her car this morning. Patient has obvious deformity to right ankle. Pulses are intact.

## 2017-05-06 NOTE — ED Notes (Signed)
Patient returned from xray.

## 2017-05-06 NOTE — ED Notes (Signed)
Patient transported to X-ray 

## 2017-05-07 ENCOUNTER — Encounter: Payer: Self-pay | Admitting: Orthopedic Surgery

## 2017-05-07 ENCOUNTER — Ambulatory Visit: Payer: BC Managed Care – PPO | Admitting: Orthopedic Surgery

## 2017-05-07 VITALS — BP 133/76 | HR 88 | Ht 62.0 in | Wt 184.0 lb

## 2017-05-07 DIAGNOSIS — S82841A Displaced bimalleolar fracture of right lower leg, initial encounter for closed fracture: Secondary | ICD-10-CM

## 2017-05-07 MED ORDER — PROMETHAZINE HCL 25 MG PO TABS: 25.0000 mg | ORAL_TABLET | Freq: Four times a day (QID) | ORAL | 0 refills | Status: DC | PRN

## 2017-05-07 NOTE — Patient Instructions (Addendum)
Ankle Fracture °A fracture is a break in a bone. The ankle joint is made up of three bones. These include the lower (distal)sections of your lower leg bones, called the tibia and fibula, along with a bone in your foot, called the talus. Depending on how bad the break is and if more than one ankle joint bone is broken, a cast or splint is used to protect and keep your injured bone from moving while it heals. Sometimes, surgery is required to help the fracture heal properly. °There are two general types of fractures: °· Stable fracture. This includes a single fracture line through one bone, with no injury to ankle ligaments. A fracture of the talus that does not have any displacement (movement of the bone on either side of the fracture line) is also stable. °· Unstable fracture. This includes more than one fracture line through one or more bones in the ankle joint. It also includes fractures that have displacement of the bone on either side of the fracture line. ° °What are the causes? °· A direct blow to the ankle. °· Quickly and severely twisting your ankle. °· Trauma, such as a car accident or falling from a significant height. °What increases the risk? °You may be at a higher risk of ankle fracture if: °· You have certain medical conditions. °· You are involved in high-impact sports. °· You are involved in a high-impact car accident. ° °What are the signs or symptoms? °· Tender and swollen ankle. °· Bruising around the injured ankle. °· Pain on movement of the ankle. °· Difficulty walking or putting weight on the ankle. °· A cold foot below the site of the ankle injury. This can occur if the blood vessels passing through your injured ankle were also damaged. °· Numbness in the foot below the site of the ankle injury. °How is this diagnosed? °An ankle fracture is usually diagnosed with a physical exam and X-rays. A CT scan may also be required for complex fractures. °How is this treated? °Stable fractures are  treated with a cast or splint and using crutches to avoid putting weight on your injured ankle. This is followed by an ankle strengthening program. Some patients require a special type of cast, depending on other medical problems they may have. Unstable fractures require surgery to ensure the bones heal properly. Your health care provider will tell you what type of fracture you have and the best treatment for your condition. °Follow these instructions at home: °· Review correct crutch use with your health care provider and use your crutches as directed. Safe use of crutches is extremely important. Misuse of crutches can cause you to fall or cause injury to nerves in your hands or armpits. °· Do not put weight or pressure on the injured ankle until directed by your health care provider. °· To lessen the swelling, keep the injured leg elevated while sitting or lying down. °· Apply ice to the injured area: °? Put ice in a plastic bag. °? Place a towel between your cast and the bag. °? Leave the ice on for 20 minutes, 2-3 times a day. °· If you have a plaster or fiberglass cast: °? Do not try to scratch the skin under the cast with any objects. This can increase your risk of skin infection. °? Check the skin around the cast every day. You may put lotion on any red or sore areas. °? Keep your cast dry and clean. °· If you have a plaster splint: °?   Wear the splint as directed. °? You may loosen the elastic around the splint if your toes become numb, tingle, or turn cold or blue. °· Do not put pressure on any part of your cast or splint; it may break. Rest your cast only on a pillow the first 24 hours until it is fully hardened. °· Your cast or splint can be protected during bathing with a plastic bag sealed to your skin with medical tape. Do not lower the cast or splint into water. °· Take medicines as directed by your health care provider. Only take over-the-counter or prescription medicines for pain, discomfort, or  fever as directed by your health care provider. °· Do not drive a vehicle until your health care provider specifically tells you it is safe to do so. °· If your health care provider has given you a follow-up appointment, it is very important to keep that appointment. Not keeping the appointment could result in a chronic or permanent injury, pain, and disability. If you have any problem keeping the appointment, call the facility for assistance. °Contact a health care provider if: °You develop increased swelling or discomfort. °Get help right away if: °· Your cast gets damaged or breaks. °· You have continued severe pain. °· You develop new pain or swelling after the cast was put on. °· Your skin or toenails below the injury turn blue or gray. °· Your skin or toenails below the injury feel cold, numb, or have loss of sensitivity to touch. °· There is a bad smell or pus draining from under the cast. °This information is not intended to replace advice given to you by your health care provider. Make sure you discuss any questions you have with your health care provider. °Document Released: 02/03/2000 Document Revised: 07/20/2015 Document Reviewed: 09/04/2012 °[employer] Interactive Patient Education © 2017 [employer] ° °

## 2017-05-07 NOTE — Progress Notes (Signed)
NEW PATIENT OFFICE VISIT   Chief Complaint  Patient presents with  . 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  Respiratory: Negative for shortness of breath.   Cardiovascular: Negative for chest pain.  Neurological: Negative for tingling.     Past Medical History:  Diagnosis Date  . Anemia   . Bursitis    Right shoulder  . Cancer ([place])   . Fibroids 05/31/2016  . Headache   . Hemorrhoids   . PONV (postoperative nausea and vomiting)   . Seasonal allergies   . Vitamin D deficiency 05/23/2016   Take 5000 IU vitamin D3 daily    Past Surgical History:  Procedure Laterality Date  . APPLICATION OF WOUND VAC  01/03/2012   Procedure: APPLICATION OF WOUND VAC;  Surgeon: Jonnie Kind, MD;  Location: [facility];  Service: Gynecology;  Laterality: N/A;  . APPLICATION OF WOUND VAC  01/05/2012   Procedure: APPLICATION OF WOUND VAC;  Surgeon: Jonnie Kind, MD;  Location: [facility];  Service: Gynecology;  Laterality: N/A;  . APPLICATION OF WOUND VAC  01/07/2012   Procedure: APPLICATION OF WOUND VAC;  Surgeon: Jonnie Kind, MD;  Location: [facility];  Service: Gynecology;;  . APPLICATION OF WOUND VAC  01/11/2012   Procedure: APPLICATION OF WOUND VAC;  Surgeon: Jonnie Kind, MD;  Location: [facility];  Service: Gynecology;  Laterality: N/A;  Fasicial Closure  . CESAREAN SECTION  12/22/2011   Procedure: CESAREAN SECTION;   Surgeon: Donnamae Jude, MD;  Location: [facility];  Service: Obstetrics;  Laterality: N/A;  Primary cesarean section with delivery of Baby A girl at 74. Baby B girl at 26.  Marland Kitchen DILITATION & CURRETTAGE/HYSTROSCOPY WITH NOVASURE ABLATION N/A 09/04/2016   Procedure: HYSTEROSCOPY WITH NOVASURE ABLATION;  Surgeon: Jonnie Kind, MD;  Location: [facility];  Service: Gynecology;  Laterality: N/A;  . ECTOPIC PREGNANCY SURGERY    . INCISION AND DRAINAGE OF WOUND  01/07/2012   Procedure: IRRIGATION AND DEBRIDEMENT WOUND;  Surgeon: Jonnie Kind, MD;  Location: [facility];  Service: Gynecology;;  . INSERTION OF MESH  01/03/2012   Procedure: INSERTION OF MESH;  Surgeon: Jonnie Kind, MD;  Location: [facility];  Service: Gynecology;  Laterality: N/A;  . MANDIBLE SURGERY Right cancer   Cancerous mass  . NASAL SINUS SURGERY    . SECONDARY CLOSURE OF WOUND  01/22/2012   Procedure: SECONDARY CLOSURE OF WOUND;  Surgeon: Jonnie Kind, MD;  Location: [facility];  Service: Gynecology;  Laterality: N/A;  . WOUND EXPLORATION  01/03/2012   Necrotizing fascitis at C section incision  . WOUND EXPLORATION  01/05/2012   Procedure: WOUND EXPLORATION;  Surgeon: Jonnie Kind, MD;  Location: [facility];  Service: Gynecology;  Laterality: N/A;  Re-Exploration of Wound Abscess with Debridement  . WOUND EXPLORATION  01/07/2012   Procedure: WOUND EXPLORATION;  Surgeon: Jonnie Kind, MD;  Location: [facility];  Service: Gynecology;;  . WOUND EXPLORATION  01/11/2012   Procedure: WOUND EXPLORATION;  Surgeon: Jonnie Kind, MD;  Location: [facility];  Service: Gynecology;  Laterality: N/A;  Debridment of Wound Abscess  . WOUND EXPLORATION  01/22/2012   Procedure: WOUND EXPLORATION;  Surgeon: Jonnie Kind, MD;  Location: [facility];  Service: Gynecology;  Laterality: N/A;  Delayed Secondary Wound Closure  . WRIST SURGERY Left    Cyst removal    Family History  Problem Relation Age of Onset  . Cancer Maternal Grandfather        prostate  .  Diabetes Mother   . Arthritis Mother   . Cancer Paternal Uncle   . Cancer Maternal Uncle        lung  . Diabetes Maternal Aunt   . Arthritis Maternal Aunt    Social History   Tobacco Use  . Smoking status: Never Smoker  . Smokeless tobacco: Never Used  Substance Use Topics  . Alcohol use: No  . Drug use: No    Current Meds  Medication Sig  . aspirin-acetaminophen-caffeine (EXCEDRIN MIGRAINE) 250-250-65 MG tablet Take 1-2 tablets by mouth daily as needed for headache.   . oxyCODONE-acetaminophen (PERCOCET) 5-325 MG tablet Take 1-2 tablets by mouth every 4 (four) hours as needed for severe pain.  . SUMAtriptan (IMITREX) 50 MG tablet Take 50 mg by mouth daily as needed for migraine.     BP 133/76   Pulse 88   Ht 5\' 2"  (1.575 m)   Wt 184 lb (83.5 kg)   BMI 33.65 kg/m    Physical Exam  Constitutional: She is oriented to person, place, and time. She appears well-developed and well-nourished.  Musculoskeletal:       Right shoulder: Normal.       Left shoulder: Normal.       Left hip: Normal.       Right ankle: She exhibits decreased range of motion, swelling and deformity. She exhibits no ecchymosis, no laceration and normal pulse. Tenderness. Lateral malleolus, medial malleolus, AITFL and CF ligament tenderness found. No posterior TFL, no head of 5th metatarsal and no proximal fibula tenderness found. Achilles tendon exhibits no pain, no defect and normal Thompson's test results.       Left ankle: She exhibits normal range of motion, no swelling, no ecchymosis, no deformity, no laceration and normal pulse. No tenderness. No lateral malleolus, no medial malleolus, no AITFL, no CF ligament, no posterior TFL, no head of 5th metatarsal and no proximal fibula tenderness found. Achilles tendon exhibits no pain, no defect and normal Thompson's test results.       Feet:  Neurological: She is alert and oriented to person, place, and time.  Psychiatric: She has a normal mood and affect.  Judgment normal.  Vitals reviewed.   Ortho Exam  MEDICAL DECISION SECTION  xrays ordered?  No  My independent reading of xrays: 3 views right ankle  Cast/splint material is over the ankle already.  There is a medial malleolus fracture appears to be transverse With a oblique lateral malleolus fracture near the ankle mortise suggesting a supination external rotation type injury  It is partially reduced with a subluxated talus    Encounter Diagnosis  Name Primary?  . Closed bimalleolar fracture of right ankle, initial encounter Yes     PLAN:   Meds ordered this encounter  Medications  . promethazine (PHENERGAN) 25 MG tablet    Sig: Take 1 tablet (25 mg  total) by mouth every 6 (six) hours as needed for nausea or vomiting.    Dispense:  30 tablet    Refill:  0    Open reduction internal fixation right ankle bimalleolar fracture with appropriate preop discussion  The procedure has been fully reviewed with the patient; The risks and benefits of surgery have been discussed and explained and understood. Alternative treatment has also been reviewed, questions were encouraged and answered. The postoperative plan is also been reviewed.  Anticipate 6 weeks out of work

## 2017-05-08 ENCOUNTER — Other Ambulatory Visit: Payer: Self-pay

## 2017-05-08 ENCOUNTER — Encounter (HOSPITAL_COMMUNITY): Payer: Self-pay

## 2017-05-08 ENCOUNTER — Telehealth: Payer: Self-pay | Admitting: Radiology

## 2017-05-08 ENCOUNTER — Encounter (HOSPITAL_COMMUNITY)
Admission: RE | Admit: 2017-05-08 | Discharge: 2017-05-08 | Disposition: A | Discharge status: Home / Self Care | Payer: BC Managed Care – PPO | Source: Ambulatory Visit | Attending: Orthopedic Surgery | Admitting: Orthopedic Surgery

## 2017-05-08 DIAGNOSIS — Y9301 Activity, walking, marching and hiking: Secondary | ICD-10-CM | POA: Diagnosis not present

## 2017-05-08 DIAGNOSIS — E559 Vitamin D deficiency, unspecified: Secondary | ICD-10-CM | POA: Diagnosis not present

## 2017-05-08 DIAGNOSIS — Y998 Other external cause status: Secondary | ICD-10-CM | POA: Diagnosis not present

## 2017-05-08 DIAGNOSIS — W109XXA Fall (on) (from) unspecified stairs and steps, initial encounter: Secondary | ICD-10-CM | POA: Diagnosis not present

## 2017-05-08 DIAGNOSIS — Y92008 Other place in unspecified non-institutional (private) residence as the place of occurrence of the external cause: Secondary | ICD-10-CM | POA: Diagnosis not present

## 2017-05-08 DIAGNOSIS — Z79899 Other long term (current) drug therapy: Secondary | ICD-10-CM | POA: Diagnosis not present

## 2017-05-08 DIAGNOSIS — S82841A Displaced bimalleolar fracture of right lower leg, initial encounter for closed fracture: Secondary | ICD-10-CM | POA: Diagnosis not present

## 2017-05-08 LAB — BASIC METABOLIC PANEL
Anion gap: 11 (ref 5–15)
BUN: 16 mg/dL (ref 6–20)
CO2: 22 mmol/L (ref 22–32)
Calcium: 8.8 mg/dL — ABNORMAL LOW (ref 8.9–10.3)
Chloride: 106 mmol/L (ref 101–111)
Creatinine, Ser: 0.71 mg/dL (ref 0.44–1.00)
GFR calc Af Amer: >60 mL/min (ref >60)
GFR calc non Af Amer: >60 mL/min (ref >60)
Glucose, Bld: 91 mg/dL (ref 65–99)
Potassium: 3.5 mmol/L (ref 3.5–5.1)
Sodium: 139 mmol/L (ref 135–145)

## 2017-05-08 LAB — CBC WITH DIFFERENTIAL/PLATELET
Basophils Absolute: 0 10*3/uL (ref 0.0–0.1)
Basophils Relative: 0 %
Eosinophils Absolute: 0.1 10*3/uL (ref 0.0–0.7)
Eosinophils Relative: 1 %
HCT: 37.4 % (ref 36.0–46.0)
Hemoglobin: 12 g/dL (ref 12.0–15.0)
Lymphocytes Relative: 32 %
Lymphs Abs: 2.3 10*3/uL (ref 0.7–4.0)
MCH: 27.4 pg (ref 26.0–34.0)
MCHC: 32.1 g/dL (ref 30.0–36.0)
MCV: 85.4 fL (ref 78.0–100.0)
Monocytes Absolute: 0.3 10*3/uL (ref 0.1–1.0)
Monocytes Relative: 5 %
Neutro Abs: 4.4 10*3/uL (ref 1.7–7.7)
Neutrophils Relative %: 62 %
Platelets: 290 10*3/uL (ref 150–400)
RBC: 4.38 MIL/uL (ref 3.87–5.11)
RDW: 13.9 % (ref 11.5–15.5)
WBC: 7.1 10*3/uL (ref 4.0–10.5)

## 2017-05-08 LAB — HCG, SERUM, QUALITATIVE: Preg, Serum: NEGATIVE

## 2017-05-08 NOTE — Patient Instructions (Signed)
Kara Gentry  05/08/2017     @PREFPERIOPPHARMACY @   Your procedure is scheduled on  05/09/2017   Report to [facility] at  60   A.M.  Call this number if you have problems the morning of surgery:  [phone]   Remember:  Do not eat food or drink liquids after midnight.  Take these medicines the morning of surgery with A SIP OF WATER  Percocet, phnergan, imitrex.   Do not wear jewelry, make-up or nail polish.  Do not wear lotions, powders, or perfumes, or deodorant.  Do not shave 48 hours prior to surgery.  Men may shave face and neck.  Do not bring valuables to the hospital.  [facility] is not responsible for any belongings or valuables.  Contacts, dentures or bridgework may not be worn into surgery.  Leave your suitcase in the car.  After surgery it may be brought to your room.  For patients admitted to the hospital, discharge time will be determined by your treatment team.  Patients discharged the day of surgery will not be allowed to drive home.   Name and phone number of your driver:   family Special instructions:  None  Please read over the following fact sheets that you were given. Anesthesia Post-op Instructions and Care and Recovery After Surgery       Displaced Bimalleolar Ankle Fracture Treated With ORIF A bimalleolar fracture is two breaks (fractures) in the lower bones of the leg that help to make up the ankle. These fractures are in the far ends of the bone that you feel as the bump on the outside of the ankle (fibula) and the bone that you feel as the bump on the inside of the ankle (tibia). The fracture is displaced. This means that the bones are not lined up correctly. The bones will be put back into position with a procedure that is called open reduction with internal fixation (ORIF). A combination of screws, screws and a metal plate, or different types of wiring will be used to hold the bones in place. Tell a health care provider  about:  Any allergies you have.  All medicines you are taking, including vitamins, herbs, eye drops, creams, and over-the-counter medicines.  Any problems you or family members have had with anesthetic medicines.  Any blood disorders you have.  Any surgeries you have had.  Any medical conditions you have, including the possibility of pregnancy. What are the risks? Generally, this is a safe procedure. However, problems may occur, including:  Excessive bleeding.  Infection.  Failure of the fracture to heal.  Long-term pain and stiffness (arthritis).  Stiffness of the ankle after the repair.  What happens before the procedure?  Ask your health care provider about: ? Changing or stopping your regular medicines. This is especially important if you are taking diabetes medicines or blood thinners. ? Taking medicines such as aspirin and ibuprofen. These medicines can thin your blood. Do not take these medicines before your procedure if your health care provider instructs you not to.  Follow instructions from your health care provider about eating or drinking restrictions.  Plan to have someone take you home after the procedure.  If you go home right after the procedure, plan to have someone with you for 24 hours.  Ask your health care provider how your surgical site will be marked or identified.  You may be given antibiotic medicine to help prevent  infection. What happens during the procedure?  To reduce your risk of infection: ? Your health care team will wash or sanitize their hands. ? Your skin will be washed with soap.  An IV tube will be inserted into one of your veins. Medicine will flow directly into your body through this tube. You may be given antibiotic medicine and medicine for pain through the IV tube.  You will be given one or more of the following: ? A medicine that numbs the area (local anesthetic). ? A medicine that makes you fall asleep (general  anesthetic). ? A medicine that is injected into your spine that numbs the area below and slightly above the injection site (spinal anesthetic). ? A medicine that is injected into an area of your body that numbs everything below the injection site (regional anesthetic).  The surgeon will make an incision through your skin to expose the areas of the fracture.  The broken bones will be put back into their normal positions. The surgeon will use a combination of screws, screws and a metal plate, or different types of wiring to hold the bones in place.  After the bones are back in place, the surgeon will close the incision using stitches (sutures) or staples.  A bandage (dressing) and a cast, splint, or supportive boot will be placed over your ankle. The procedure may vary among health care providers and hospitals. What happens after the procedure?  Your blood pressure, heart rate, breathing rate, and blood oxygen level will be monitored often until the medicines you were given have worn off.  You may be given medicine to control the pain. This information is not intended to replace advice given to you by your health care provider. Make sure you discuss any questions you have with your health care provider. Document Released: 11/15/2004 Document Revised: 07/14/2015 Document Reviewed: 12/30/2013 Elsevier Interactive Patient Education  2018 [place]. Displaced Bimalleolar Ankle Fracture Treated With ORIF, Care After Refer to this sheet in the next few weeks. These instructions provide you with information about caring for yourself after your procedure. Your health care provider may also give you more specific instructions. Your treatment has been planned according to current medical practices, but problems sometimes occur. Call your health care provider if you have any problems or questions after your procedure. What can I expect after the procedure? After the procedure, it is common to  have:  Pain.  Swelling.  Follow these instructions at home: If you have a cast:  Do not stick anything inside the cast to scratch your skin. Doing that increases your risk of infection.  Check the skin around the cast every day. Report any concerns to your health care provider. You may put lotion on dry skin around the edges of the cast. Do not apply lotion to the skin underneath the cast.  Do not put pressure on any part of the cast until it is fully hardened. This may take several hours.  Keep the cast clean and dry. If you have a splint or boot:  Wear it as directed by your health care provider. Remove it only as directed by your health care provider.  Loosen the splint or boot if your toes become numb and tingle, or if they turn cold and blue.  Do not pressure on any part of the splint or boot until it is fully hardened. This may take several hours.  Keep the splint or boot clean and dry. Bathing  Do not take baths,  swim, or use a hot tub until your health care provider approves. Ask your health care provider if you can take showers. You may only be allowed to take sponge baths for bathing.  If your health care provider approves bathing and showering, cover the cast or splint with a watertight plastic bag to protect it from water. Do not let the cast or splint get wet.  Keep the bandage (dressing) dry until your health care provider says it can be removed. Incision care  There are many ways to close and cover an incision. For example, an incision can be closed with stitches (sutures), skin glue, or adhesive strips. Follow instructions from your health care provider about: ? How to take care of your incision. ? When and how you should change your bandage (dressing). ? When you should remove your dressing. ? Removing whatever was used to close your incision.  Check your incision area every day for signs of infection. Watch for: ? Redness, swelling, or pain. ? Fluid, blood,  or pus. Managing pain, stiffness, and swelling  Move your toes often to avoid stiffness and to lessen swelling.  Raise (elevate) the injured area above the level of your heart while you are sitting or lying down.  If directed, apply ice to the injured area (if you have a splint or a boot, not a cast). ? Put ice in a plastic bag. ? Place a towel between your skin and the bag. ? Leave the ice on for 20 minutes, 2-3 times per day. Driving  Do not drive or operate heavy machinery while taking pain medicine.  Do not drive while wearing a cast, splint, or boot on a foot that you use for driving. Activity  Return to your normal activities as directed by your health care provider. Ask your health care provider what activities are safe for you.  Perform exercises daily as directed by your health care provider or physical therapist. Safety  Do not walk on the injured ankle and do not use it to support your body weight until your health care provider says that you can. Follow these instructions exactly to prevent problems. Use crutches or other supportive devices as directed by your health care provider. General instructions  Do not use any tobacco products, including cigarettes, chewing tobacco, or e-cigarettes. Tobacco can delay bone healing. If you need help quitting, ask your health care provider.  Take medicines only as directed by your health care provider.  Keep all follow-up visits as directed by your health care provider. This is important. Contact a health care provider if:  You have a fever.  Your pain medicine is not helping.  You have redness, swelling, or pain at the site of your incision.  You have fluid, blood, or pus coming from your incision or seeping through your cast.  You notice a bad smell coming from your incision or your dressing. Get help right away if:  You have chest pain.  You have difficulty breathing.  You have numbness or tingling in your foot or  leg.  Your foot becomes cold, pale, or blue. This information is not intended to replace advice given to you by your health care provider. Make sure you discuss any questions you have with your health care provider. Document Released: 08/25/2004 Document Revised: 07/14/2015 Document Reviewed: 12/30/2013 Elsevier Interactive Patient Education  2018 [place] Anesthesia, Adult General anesthesia is the use of medicines to make a person "go to sleep" (be unconscious) for a  medical procedure. General anesthesia is often recommended when a procedure:  Is long.  Requires you to be still or in an unusual position.  Is major and can cause you to lose blood.  Is impossible to do without general anesthesia.  The medicines used for general anesthesia are called general anesthetics. In addition to making you sleep, the medicines:  Prevent pain.  Control your blood pressure.  Relax your muscles.  Tell a health care provider about:  Any allergies you have.  All medicines you are taking, including vitamins, herbs, eye drops, creams, and over-the-counter medicines.  Any problems you or family members have had with anesthetic medicines.  Types of anesthetics you have had in the past.  Any bleeding disorders you have.  Any surgeries you have had.  Any medical conditions you have.  Any history of heart or lung conditions, such as heart failure, sleep apnea, or chronic obstructive pulmonary disease (COPD).  Whether you are pregnant or may be pregnant.  Whether you use tobacco, alcohol, marijuana, or street drugs.  Any history of Armed forces logistics/support/administrative officer.  Any history of depression or anxiety. What are the risks? Generally, this is a safe procedure. However, problems may occur, including:  Allergic reaction to anesthetics.  Lung and heart problems.  Inhaling food or liquids from your stomach into your lungs (aspiration).  Injury to nerves.  Waking up during your  procedure and being unable to move (rare).  Extreme agitation or a state of mental confusion (delirium) when you wake up from the anesthetic.  Air in the bloodstream, which can lead to stroke.  These problems are more likely to develop if you are having a major surgery or if you have an advanced medical condition. You can prevent some of these complications by answering all of your health care provider's questions thoroughly and by following all pre-procedure instructions. General anesthesia can cause side effects, including:  Nausea or vomiting  A sore throat from the breathing tube.  Feeling cold or shivery.  Feeling tired, washed out, or achy.  Sleepiness or drowsiness.  Confusion or agitation.  What happens before the procedure? Staying hydrated Follow instructions from your health care provider about hydration, which may include:  Up to 2 hours before the procedure - you may continue to drink clear liquids, such as water, clear fruit juice, black coffee, and plain tea.  Eating and drinking restrictions Follow instructions from your health care provider about eating and drinking, which may include:  8 hours before the procedure - stop eating heavy meals or foods such as meat, fried foods, or fatty foods.  6 hours before the procedure - stop eating light meals or foods, such as toast or cereal.  6 hours before the procedure - stop drinking milk or drinks that contain milk.  2 hours before the procedure - stop drinking clear liquids.  Medicines  Ask your health care provider about: ? Changing or stopping your regular medicines. This is especially important if you are taking diabetes medicines or blood thinners. ? Taking medicines such as aspirin and ibuprofen. These medicines can thin your blood. Do not take these medicines before your procedure if your health care provider instructs you not to. ? Taking new dietary supplements or medicines. Do not take these during the  week before your procedure unless your health care provider approves them.  If you are told to take a medicine or to continue taking a medicine on the day of the procedure, take the medicine with sips  of water. General instructions   Ask if you will be going home the same day, the following day, or after a longer hospital stay. ? Plan to have someone take you home. ? Plan to have someone stay with you for the first 24 hours after you leave the hospital or clinic.  For 3-6 weeks before the procedure, try not to use any tobacco products, such as cigarettes, chewing tobacco, and e-cigarettes.  You may brush your teeth on the morning of the procedure, but make sure to spit out the toothpaste. What happens during the procedure?  You will be given anesthetics through a mask and through an IV tube in one of your veins.  You may receive medicine to help you relax (sedative).  As soon as you are asleep, a breathing tube may be used to help you breathe.  An anesthesia specialist will stay with you throughout the procedure. He or she will help keep you comfortable and safe by continuing to give you medicines and adjusting the amount of medicine that you get. He or she will also watch your blood pressure, pulse, and oxygen levels to make sure that the anesthetics do not cause any problems.  If a breathing tube was used to help you breathe, it will be removed before you wake up. The procedure may vary among health care providers and hospitals. What happens after the procedure?  You will wake up, often slowly, after the procedure is complete, usually in a recovery area.  Your blood pressure, heart rate, breathing rate, and blood oxygen level will be monitored until the medicines you were given have worn off.  You may be given medicine to help you calm down if you feel anxious or agitated.  If you will be going home the same day, your health care provider may check to make sure you can stand,  drink, and urinate.  Your health care providers will treat your pain and side effects before you go home.  Do not drive for 24 hours if you received a sedative.  You may: ? Feel nauseous and vomit. ? Have a sore throat. ? Have mental slowness. ? Feel cold or shivery. ? Feel sleepy. ? Feel tired. ? Feel sore or achy, even in parts of your body where you did not have surgery. This information is not intended to replace advice given to you by your health care provider. Make sure you discuss any questions you have with your health care provider. Document Released: 05/15/2007 Document Revised: 07/19/2015 Document Reviewed: 01/20/2015 Elsevier Interactive Patient Education  2018 [place] Anesthesia, Adult, Care After These instructions provide you with information about caring for yourself after your procedure. Your health care provider may also give you more specific instructions. Your treatment has been planned according to current medical practices, but problems sometimes occur. Call your health care provider if you have any problems or questions after your procedure. What can I expect after the procedure? After the procedure, it is common to have:  Vomiting.  A sore throat.  Mental slowness.  It is common to feel:  Nauseous.  Cold or shivery.  Sleepy.  Tired.  Sore or achy, even in parts of your body where you did not have surgery.  Follow these instructions at home: For at least 24 hours after the procedure:  Do not: ? Participate in activities where you could fall or become injured. ? Drive. ? Use heavy machinery. ? Drink alcohol. ? Take sleeping pills or medicines that  cause drowsiness. ? Make important decisions or sign legal documents. ? Take care of children on your own.  Rest. Eating and drinking  If you vomit, drink water, juice, or soup when you can drink without vomiting.  Drink enough fluid to keep your urine clear or pale  yellow.  Make sure you have little or no nausea before eating solid foods.  Follow the diet recommended by your health care provider. General instructions  Have a responsible adult stay with you until you are awake and alert.  Return to your normal activities as told by your health care provider. Ask your health care provider what activities are safe for you.  Take over-the-counter and prescription medicines only as told by your health care provider.  If you smoke, do not smoke without supervision.  Keep all follow-up visits as told by your health care provider. This is important. Contact a health care provider if:  You continue to have nausea or vomiting at home, and medicines are not helpful.  You cannot drink fluids or start eating again.  You cannot urinate after 8-12 hours.  You develop a skin rash.  You have fever.  You have increasing redness at the site of your procedure. Get help right away if:  You have difficulty breathing.  You have chest pain.  You have unexpected bleeding.  You feel that you are having a life-threatening or urgent problem. This information is not intended to replace advice given to you by your health care provider. Make sure you discuss any questions you have with your health care provider. Document Released: 05/14/2000 Document Revised: 07/11/2015 Document Reviewed: 01/20/2015 Elsevier Interactive Patient Education  [employer].

## 2017-05-08 NOTE — Telephone Encounter (Signed)
I called and spoke to [place] at [facility] for CPT code [phone] / no prior authorization is required. Ref number for the call is her name and March 20th 2019

## 2017-05-08 NOTE — H&P (Signed)
NEW PATIENT OFFICE VISIT    Chief Complaint  Patient presents with  . 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  Respiratory: Negative for shortness of breath.   Cardiovascular: Negative for chest pain.  Neurological: Negative for tingling.            Past Medical History:  Diagnosis Date  . Anemia    . Bursitis      Right shoulder  . Cancer ([place])    . Fibroids 05/31/2016  . Headache    . Hemorrhoids    . PONV (postoperative nausea and vomiting)    . Seasonal allergies    . Vitamin D deficiency 05/23/2016    Take 5000 IU vitamin D3 daily           Past Surgical History:  Procedure Laterality Date  . APPLICATION OF WOUND VAC   01/03/2012    Procedure: APPLICATION OF WOUND VAC;  Surgeon: Jonnie Kind, MD;  Location: [facility];  Service: Gynecology;  Laterality: N/A;  . APPLICATION OF WOUND VAC   01/05/2012    Procedure: APPLICATION OF WOUND VAC;  Surgeon: Jonnie Kind, MD;  Location: [facility];  Service: Gynecology;  Laterality: N/A;  . APPLICATION OF WOUND VAC   01/07/2012    Procedure: APPLICATION OF WOUND VAC;  Surgeon: Jonnie Kind, MD;  Location: [facility];  Service: Gynecology;;  . APPLICATION OF WOUND VAC   01/11/2012    Procedure: APPLICATION OF WOUND VAC;  Surgeon: Jonnie Kind, MD;  Location: [facility];  Service: Gynecology;  Laterality: N/A;  Fasicial Closure  . CESAREAN SECTION    12/22/2011    Procedure: CESAREAN SECTION;  Surgeon: Donnamae Jude, MD;  Location: [facility];  Service: Obstetrics;  Laterality: N/A;  Primary cesarean section with delivery of Baby A girl at 24. Baby B girl at 64.  Marland Kitchen DILITATION & CURRETTAGE/HYSTROSCOPY WITH NOVASURE ABLATION N/A 09/04/2016    Procedure: HYSTEROSCOPY WITH NOVASURE ABLATION;  Surgeon: Jonnie Kind, MD;  Location: [facility];  Service: Gynecology;  Laterality: N/A;  . ECTOPIC PREGNANCY SURGERY      . INCISION AND DRAINAGE OF WOUND   01/07/2012    Procedure: IRRIGATION AND DEBRIDEMENT WOUND;  Surgeon: Jonnie Kind, MD;  Location: [facility];  Service: Gynecology;;  . INSERTION OF MESH   01/03/2012    Procedure: INSERTION OF MESH;  Surgeon: Jonnie Kind, MD;  Location: [facility];  Service: Gynecology;  Laterality: N/A;  . MANDIBLE SURGERY Right cancer    Cancerous mass  . NASAL SINUS SURGERY      . SECONDARY CLOSURE OF WOUND   01/22/2012    Procedure: SECONDARY CLOSURE OF WOUND;  Surgeon: Jonnie Kind, MD;  Location: [facility];  Service: Gynecology;  Laterality: N/A;  . WOUND EXPLORATION   01/03/2012  Necrotizing fascitis at C section incision  . WOUND EXPLORATION   01/05/2012    Procedure: WOUND EXPLORATION;  Surgeon: Jonnie Kind, MD;  Location: [facility];  Service: Gynecology;  Laterality: N/A;  Re-Exploration of Wound Abscess with Debridement  . WOUND EXPLORATION   01/07/2012    Procedure: WOUND EXPLORATION;  Surgeon: Jonnie Kind, MD;  Location: [facility];  Service: Gynecology;;  . WOUND EXPLORATION   01/11/2012    Procedure: WOUND EXPLORATION;  Surgeon: Jonnie Kind, MD;  Location: [facility];  Service: Gynecology;  Laterality: N/A;  Debridment of Wound Abscess  . WOUND EXPLORATION   01/22/2012    Procedure: WOUND EXPLORATION;  Surgeon: Jonnie Kind, MD;  Location: [facility];  Service: Gynecology;  Laterality: N/A;  Delayed Secondary Wound Closure  . WRIST SURGERY Left      Cyst removal           Family History  Problem  Relation Age of Onset  . Cancer Maternal Grandfather          prostate  . Diabetes Mother    . Arthritis Mother    . Cancer Paternal Uncle    . Cancer Maternal Uncle          lung  . Diabetes Maternal Aunt    . Arthritis Maternal Aunt      Social History        Tobacco Use  . Smoking status: Never Smoker  . Smokeless tobacco: Never Used  Substance Use Topics  . Alcohol use: No  . Drug use: No      Active Medications      Current Meds  Medication Sig  . aspirin-acetaminophen-caffeine (EXCEDRIN MIGRAINE) 250-250-65 MG tablet Take 1-2 tablets by mouth daily as needed for headache.   . oxyCODONE-acetaminophen (PERCOCET) 5-325 MG tablet Take 1-2 tablets by mouth every 4 (four) hours as needed for severe pain.  . SUMAtriptan (IMITREX) 50 MG tablet Take 50 mg by mouth daily as needed for migraine.         BP 133/76   Pulse 88   Ht 5\' 2"  (1.575 m)   Wt 184 lb (83.5 kg)   BMI 33.65 kg/m     Physical Exam  Constitutional: She is oriented to person, place, and time. She appears well-developed and well-nourished.  Musculoskeletal:       Right shoulder: Normal.       Left shoulder: Normal.       Left hip: Normal.       Right ankle: She exhibits decreased range of motion, swelling and deformity. She exhibits no ecchymosis, no laceration and normal pulse. Tenderness. Lateral malleolus, medial malleolus, AITFL and CF ligament tenderness found. No posterior TFL, no head of 5th metatarsal and no proximal fibula tenderness found. Achilles tendon exhibits no pain, no defect and normal Thompson's test results.       Left ankle: She exhibits normal range of motion, no swelling, no ecchymosis, no deformity, no laceration and normal pulse. No tenderness. No lateral malleolus, no medial malleolus, no AITFL, no CF ligament, no posterior TFL, no head of 5th metatarsal and no proximal fibula tenderness found. Achilles tendon exhibits no pain, no defect and normal Thompson's test results.        Feet:  Neurological: She is alert and oriented to person, place, and time.  Psychiatric: She has a normal mood and affect. Judgment normal.  Vitals reviewed.     Ortho Exam   MEDICAL DECISION SECTION  xrays ordered?  No   My independent reading of xrays: 3 views right ankle   Cast/splint material is over the ankle already.  There is a medial malleolus fracture appears to be transverse With a oblique lateral malleolus fracture near the ankle mortise suggesting a supination external rotation type injury   It is partially reduced with a subluxated talus           Encounter Diagnosis  Name Primary?  . Closed bimalleolar fracture of right ankle, initial encounter Yes        PLAN:        Meds ordered this encounter  Medications  . promethazine (PHENERGAN) 25 MG tablet      Sig: Take 1 tablet (25 mg total) by mouth every 6 (six) hours as needed for nausea or vomiting.      Dispense:  30 tablet      Refill:  0      Open reduction internal fixation right ankle bimalleolar fracture with appropriate preop discussion   The procedure has been fully reviewed with the patient; The risks and benefits of surgery have been discussed and explained and understood. Alternative treatment has also been reviewed, questions were encouraged and answered. The postoperative plan is also been reviewed.   Anticipate 6 weeks out of work

## 2017-05-09 ENCOUNTER — Ambulatory Visit (HOSPITAL_COMMUNITY)
Admission: RE | Admit: 2017-05-09 | Discharge: 2017-05-09 | Disposition: A | Discharge status: Home / Self Care | Payer: BC Managed Care – PPO | Source: Ambulatory Visit | Attending: Orthopedic Surgery | Admitting: Orthopedic Surgery

## 2017-05-09 ENCOUNTER — Ambulatory Visit (HOSPITAL_COMMUNITY): Payer: BC Managed Care – PPO | Admitting: Anesthesiology

## 2017-05-09 ENCOUNTER — Encounter (HOSPITAL_COMMUNITY): Payer: Self-pay | Admitting: *Deleted

## 2017-05-09 ENCOUNTER — Encounter (HOSPITAL_COMMUNITY)
Admission: RE | Disposition: A | Discharge status: Home / Self Care | Payer: Self-pay | Source: Ambulatory Visit | Attending: Orthopedic Surgery

## 2017-05-09 ENCOUNTER — Ambulatory Visit (HOSPITAL_COMMUNITY): Payer: BC Managed Care – PPO

## 2017-05-09 DIAGNOSIS — Z79899 Other long term (current) drug therapy: Secondary | ICD-10-CM | POA: Insufficient documentation

## 2017-05-09 DIAGNOSIS — Z9889 Other specified postprocedural states: Secondary | ICD-10-CM | POA: Diagnosis not present

## 2017-05-09 DIAGNOSIS — S82841A Displaced bimalleolar fracture of right lower leg, initial encounter for closed fracture: Secondary | ICD-10-CM | POA: Insufficient documentation

## 2017-05-09 DIAGNOSIS — Z8781 Personal history of (healed) traumatic fracture: Secondary | ICD-10-CM

## 2017-05-09 DIAGNOSIS — W109XXA Fall (on) (from) unspecified stairs and steps, initial encounter: Secondary | ICD-10-CM | POA: Insufficient documentation

## 2017-05-09 DIAGNOSIS — Y9301 Activity, walking, marching and hiking: Secondary | ICD-10-CM | POA: Insufficient documentation

## 2017-05-09 DIAGNOSIS — Y92008 Other place in unspecified non-institutional (private) residence as the place of occurrence of the external cause: Secondary | ICD-10-CM | POA: Insufficient documentation

## 2017-05-09 DIAGNOSIS — E559 Vitamin D deficiency, unspecified: Secondary | ICD-10-CM | POA: Insufficient documentation

## 2017-05-09 DIAGNOSIS — Y998 Other external cause status: Secondary | ICD-10-CM | POA: Insufficient documentation

## 2017-05-09 HISTORY — PX: ORIF ANKLE FRACTURE: SHX5408

## 2017-05-09 SURGERY — OPEN REDUCTION INTERNAL FIXATION (ORIF) ANKLE FRACTURE
Anesthesia: General | Site: Ankle | Laterality: Right

## 2017-05-09 MED ORDER — DEXAMETHASONE SODIUM PHOSPHATE 4 MG/ML IJ SOLN
4.0000 mg | Freq: Once | INTRAMUSCULAR | Status: AC
  Administered 2017-05-09: 4 mg via INTRAVENOUS

## 2017-05-09 MED ORDER — HYDROMORPHONE HCL 1 MG/ML IJ SOLN
0.2500 mg | INTRAMUSCULAR | Status: DC | PRN
  Administered 2017-05-09: 0.5 mg via INTRAVENOUS
  Filled 2017-05-09: qty 0.5

## 2017-05-09 MED ORDER — ROCURONIUM BROMIDE 50 MG/5ML IV SOLN
INTRAVENOUS | Status: AC
  Filled 2017-05-09: qty 1

## 2017-05-09 MED ORDER — FENTANYL CITRATE (PF) 100 MCG/2ML IJ SOLN
INTRAMUSCULAR | Status: AC
  Filled 2017-05-09: qty 2

## 2017-05-09 MED ORDER — OXYCODONE-ACETAMINOPHEN 5-325 MG PO TABS: 1.0000 | ORAL_TABLET | ORAL | 0 refills | Status: DC | PRN

## 2017-05-09 MED ORDER — MIDAZOLAM HCL 2 MG/2ML IJ SOLN
1.0000 mg | INTRAMUSCULAR | Status: AC
  Administered 2017-05-09: 2 mg via INTRAVENOUS

## 2017-05-09 MED ORDER — ONDANSETRON HCL 4 MG/2ML IJ SOLN
INTRAMUSCULAR | Status: AC
  Filled 2017-05-09: qty 2

## 2017-05-09 MED ORDER — KETOROLAC TROMETHAMINE 30 MG/ML IJ SOLN
30.0000 mg | Freq: Once | INTRAMUSCULAR | Status: AC
Start: 2017-05-09 — End: 2017-05-09
  Administered 2017-05-09: 30 mg via INTRAVENOUS
  Filled 2017-05-09: qty 1

## 2017-05-09 MED ORDER — MIDAZOLAM HCL 2 MG/2ML IJ SOLN
INTRAMUSCULAR | Status: AC
  Filled 2017-05-09: qty 2

## 2017-05-09 MED ORDER — LACTATED RINGERS IV SOLN
INTRAVENOUS | Status: DC
  Administered 2017-05-09: 09:00:00 via INTRAVENOUS

## 2017-05-09 MED ORDER — SCOPOLAMINE 1 MG/3DAYS TD PT72
MEDICATED_PATCH | TRANSDERMAL | Status: AC
  Filled 2017-05-09: qty 1

## 2017-05-09 MED ORDER — SCOPOLAMINE 1 MG/3DAYS TD PT72
1.0000 | MEDICATED_PATCH | Freq: Once | TRANSDERMAL | Status: DC
  Administered 2017-05-09: 1.5 mg via TRANSDERMAL

## 2017-05-09 MED ORDER — CEFAZOLIN SODIUM-DEXTROSE 2-4 GM/100ML-% IV SOLN
2.0000 g | INTRAVENOUS | Status: AC
  Administered 2017-05-09: 2 g via INTRAVENOUS

## 2017-05-09 MED ORDER — ONDANSETRON HCL 4 MG/2ML IJ SOLN
4.0000 mg | Freq: Once | INTRAMUSCULAR | Status: AC
  Administered 2017-05-09: 4 mg via INTRAVENOUS
  Filled 2017-05-09: qty 2

## 2017-05-09 MED ORDER — PROPOFOL 10 MG/ML IV BOLUS
INTRAVENOUS | Status: AC
  Filled 2017-05-09: qty 20

## 2017-05-09 MED ORDER — FENTANYL CITRATE (PF) 100 MCG/2ML IJ SOLN
INTRAMUSCULAR | Status: DC | PRN
  Administered 2017-05-09 (×3): 100 ug via INTRAVENOUS

## 2017-05-09 MED ORDER — CHLORHEXIDINE GLUCONATE 4 % EX LIQD: 60.0000 mL | Freq: Once | CUTANEOUS | Status: DC

## 2017-05-09 MED ORDER — DEXAMETHASONE SODIUM PHOSPHATE 4 MG/ML IJ SOLN
INTRAMUSCULAR | Status: AC
  Filled 2017-05-09: qty 1

## 2017-05-09 MED ORDER — CEFAZOLIN SODIUM-DEXTROSE 2-4 GM/100ML-% IV SOLN
INTRAVENOUS | Status: AC
  Filled 2017-05-09: qty 100

## 2017-05-09 MED ORDER — PREGABALIN 50 MG PO CAPS
50.0000 mg | ORAL_CAPSULE | Freq: Once | ORAL | Status: AC
  Administered 2017-05-09: 50 mg via ORAL
  Filled 2017-05-09: qty 1

## 2017-05-09 MED ORDER — LACTATED RINGERS IV SOLN
INTRAVENOUS | Status: DC | PRN
  Administered 2017-05-09 (×2): via INTRAVENOUS

## 2017-05-09 MED ORDER — 0.9 % SODIUM CHLORIDE (POUR BTL) OPTIME
TOPICAL | Status: DC | PRN
  Administered 2017-05-09 (×2): 1000 mL

## 2017-05-09 MED ORDER — ONDANSETRON HCL 4 MG/2ML IJ SOLN
INTRAMUSCULAR | Status: DC | PRN
  Administered 2017-05-09: 4 mg via INTRAVENOUS

## 2017-05-09 MED ORDER — OXYCODONE HCL 5 MG PO TABS
5.0000 mg | ORAL_TABLET | Freq: Once | ORAL | Status: AC
  Administered 2017-05-09: 5 mg via ORAL
  Filled 2017-05-09: qty 1

## 2017-05-09 MED ORDER — ONDANSETRON HCL 4 MG/2ML IJ SOLN
4.0000 mg | Freq: Once | INTRAMUSCULAR | Status: AC
  Administered 2017-05-09: 4 mg via INTRAVENOUS

## 2017-05-09 MED ORDER — BUPIVACAINE-EPINEPHRINE (PF) 0.5% -1:200000 IJ SOLN
INTRAMUSCULAR | Status: DC | PRN
  Administered 2017-05-09: 35 mL via PERINEURAL

## 2017-05-09 MED ORDER — PROPOFOL 10 MG/ML IV BOLUS
INTRAVENOUS | Status: DC | PRN
  Administered 2017-05-09: 200 mg via INTRAVENOUS
  Administered 2017-05-09: 50 mg via INTRAVENOUS
  Administered 2017-05-09: 100 mg via INTRAVENOUS

## 2017-05-09 MED ORDER — SUCCINYLCHOLINE CHLORIDE 20 MG/ML IJ SOLN
INTRAMUSCULAR | Status: DC | PRN
  Administered 2017-05-09: 20 mg via INTRAVENOUS

## 2017-05-09 MED ORDER — BUPIVACAINE-EPINEPHRINE (PF) 0.5% -1:200000 IJ SOLN
INTRAMUSCULAR | Status: AC
  Filled 2017-05-09: qty 60

## 2017-05-09 SURGICAL SUPPLY — 77 items
ANCH SUT 2 CRKSCW 12X3.5 ST (Anchor) ×1 IMPLANT
ANCHOR SUT CORKSCREW 3.5X12 (Anchor) ×2 IMPLANT
BANDAGE ELASTIC 3 LF NS (GAUZE/BANDAGES/DRESSINGS) ×3 IMPLANT
BANDAGE ELASTIC 4 LF NS (GAUZE/BANDAGES/DRESSINGS) ×6 IMPLANT
BANDAGE ESMARK 4X12 BL STRL LF (DISPOSABLE) ×1 IMPLANT
BIT DRILL 2.0X128 (BIT) ×1 IMPLANT
BIT DRILL 2.0X128MM (BIT) ×1
BIT DRILL 3.5X122MM AO FIT (BIT) ×2 IMPLANT
BIT DRILL CANN 2.7 (BIT)
BIT DRILL CANN 2.7MM (BIT)
BIT DRILL SRG 2.7XCANN AO CPLG (BIT) IMPLANT
BIT DRL SRG 2.7XCANN AO CPLNG (BIT)
BLADE SURG SZ10 CARB STEEL (BLADE) ×3 IMPLANT
BNDG CMPR 12X4 ELC STRL LF (DISPOSABLE) ×1
BNDG CMPR MED 5X3 ELC HKLP NS (GAUZE/BANDAGES/DRESSINGS) ×1
BNDG CMPR MED 5X4 ELC HKLP NS (GAUZE/BANDAGES/DRESSINGS) ×2
BNDG COHESIVE 4X5 TAN STRL (GAUZE/BANDAGES/DRESSINGS) ×3 IMPLANT
BNDG ESMARK 4X12 BLUE STRL LF (DISPOSABLE) ×3
CHLORAPREP W/TINT 26ML (MISCELLANEOUS) ×4 IMPLANT
CLOTH BEACON ORANGE TIMEOUT ST (SAFETY) ×3 IMPLANT
COVER LIGHT HANDLE STERIS (MISCELLANEOUS) ×6 IMPLANT
CUFF TOURNIQUET SINGLE 34IN LL (TOURNIQUET CUFF) ×3 IMPLANT
CUFF TOURNIQUET SINGLE 44IN (TOURNIQUET CUFF) IMPLANT
DECANTER SPIKE VIAL GLASS SM (MISCELLANEOUS) ×6 IMPLANT
DRAPE C-ARM FOLDED MOBILE STRL (DRAPES) ×3 IMPLANT
DRAPE PROXIMA HALF (DRAPES) ×3 IMPLANT
DRILL 2.6X122MM WL AO SHAFT (BIT) ×2 IMPLANT
GAUZE SPONGE 4X4 12PLY STRL (GAUZE/BANDAGES/DRESSINGS) ×3 IMPLANT
GAUZE XEROFORM 5X9 LF (GAUZE/BANDAGES/DRESSINGS) ×3 IMPLANT
GLOVE BIO SURGEON STRL SZ7 (GLOVE) ×4 IMPLANT
GLOVE BIOGEL PI IND STRL 7.0 (GLOVE) ×2 IMPLANT
GLOVE BIOGEL PI INDICATOR 7.0 (GLOVE) ×6
GLOVE SKINSENSE NS SZ8.0 LF (GLOVE) ×2
GLOVE SKINSENSE STRL SZ8.0 LF (GLOVE) ×1 IMPLANT
GLOVE SS N UNI LF 8.5 STRL (GLOVE) ×3 IMPLANT
GOWN STRL REUS W/TWL LRG LVL3 (GOWN DISPOSABLE) ×6 IMPLANT
GOWN STRL REUS W/TWL XL LVL3 (GOWN DISPOSABLE) ×3 IMPLANT
INST SET MINOR BONE (KITS) ×3 IMPLANT
K-WIRE 1.4X100 (WIRE)
K-WIRE 1.6X150 (WIRE)
K-WIRE FX150X1.6XKRSH (WIRE)
K-WIRE ORTHOPEDIC 1.4X150L (WIRE) ×6
K-WIRE SMOOTH 2.0X150 (WIRE)
KIT TURNOVER KIT A (KITS) ×3 IMPLANT
KWIRE 1.4X100 (WIRE) IMPLANT
KWIRE FX150X1.6XKRSH (WIRE) IMPLANT
KWIRE ORTHOPEDIC 1.4X150L (WIRE) IMPLANT
KWIRE SMOOTH 2.0X150 (WIRE) IMPLANT
MANIFOLD NEPTUNE II (INSTRUMENTS) ×3 IMPLANT
NDL HYPO 21X1.5 SAFETY (NEEDLE) ×1 IMPLANT
NDL MAYO 6 CRC TAPER PT (NEEDLE) IMPLANT
NEEDLE HYPO 21X1.5 SAFETY (NEEDLE) ×3 IMPLANT
NEEDLE MAYO 6 CRC TAPER PT (NEEDLE) ×3 IMPLANT
NS IRRIG 1000ML POUR BTL (IV SOLUTION) ×3 IMPLANT
PACK BASIC LIMB (CUSTOM PROCEDURE TRAY) ×3 IMPLANT
PAD ABD 5X9 TENDERSORB (GAUZE/BANDAGES/DRESSINGS) ×6 IMPLANT
PAD ARMBOARD 7.5X6 YLW CONV (MISCELLANEOUS) ×3 IMPLANT
PAD CAST 4YDX4 CTTN HI CHSV (CAST SUPPLIES) ×1 IMPLANT
PADDING CAST COTTON 4X4 STRL (CAST SUPPLIES) ×3
PADDING WEBRIL 4 STERILE (GAUZE/BANDAGES/DRESSINGS) ×2 IMPLANT
PASSER SUT SWANSON 36MM LOOP (INSTRUMENTS) ×2 IMPLANT
PLATE DISTAL FIBULA 3HOLE (Plate) ×2 IMPLANT
SCREW BONE 14MMX3.5MM (Screw) ×2 IMPLANT
SCREW BONE 18 (Screw) ×4 IMPLANT
SCREW BONE 3.5X20MM (Screw) ×4 IMPLANT
SCREW BONE NON-LCKING 3.5X12MM (Screw) ×4 IMPLANT
SET BASIN LINEN APH (SET/KITS/TRAYS/PACK) ×3 IMPLANT
SPLINT J IMMOBILIZER 4X20FT (CAST SUPPLIES) IMPLANT
SPLINT J PLASTER J 4INX20Y (CAST SUPPLIES) ×2
SPONGE LAP 18X18 X RAY DECT (DISPOSABLE) ×3 IMPLANT
STAPLER VISISTAT 35W (STAPLE) ×3 IMPLANT
SUT ETHIBOND NAB OS 4 #2 30IN (SUTURE) ×4 IMPLANT
SUT ETHILON 3 0 FSL (SUTURE) ×4 IMPLANT
SUT MON AB 0 CT1 (SUTURE) ×3 IMPLANT
SUT MON AB 2-0 CT1 36 (SUTURE) IMPLANT
SYR 30ML LL (SYRINGE) ×3 IMPLANT
SYR BULB IRRIGATION 50ML (SYRINGE) ×3 IMPLANT

## 2017-05-09 NOTE — Discharge Instructions (Signed)
No weightbearing on the operative leg   Keep ice on the ankle as much as possible   Elevate the leg is much as possible   DME Walker for at home use   Displaced Bimalleolar Ankle Fracture Treated With ORIF, Care After Refer to this sheet in the next few weeks. These instructions provide you with information about caring for yourself after your procedure. Your health care provider may also give you more specific instructions. Your treatment has been planned according to current medical practices, but problems sometimes occur. Call your health care provider if you have any problems or questions after your procedure. What can I expect after the procedure? After the procedure, it is common to have:  Pain.  Swelling.  Follow these instructions at home: If you have a cast:  Do not stick anything inside the cast to scratch your skin. Doing that increases your risk of infection.  Check the skin around the cast every day. Report any concerns to your health care provider. You may put lotion on dry skin around the edges of the cast. Do not apply lotion to the skin underneath the cast.  Do not put pressure on any part of the cast until it is fully hardened. This may take several hours.  Keep the cast clean and dry. If you have a splint or boot:  Wear it as directed by your health care provider. Remove it only as directed by your health care provider.  Loosen the splint or boot if your toes become numb and tingle, or if they turn cold and blue.  Do not pressure on any part of the splint or boot until it is fully hardened. This may take several hours.  Keep the splint or boot clean and dry. Bathing  Do not take baths, swim, or use a hot tub until your health care provider approves. Ask your health care provider if you can take showers. You may only be allowed to take sponge baths for bathing.  If your health care provider approves bathing and showering, cover the cast or splint with a  watertight plastic bag to protect it from water. Do not let the cast or splint get wet.  Keep the bandage (dressing) dry until your health care provider says it can be removed. Incision care  There are many ways to close and cover an incision. For example, an incision can be closed with stitches (sutures), skin glue, or adhesive strips. Follow instructions from your health care provider about: ? How to take care of your incision. ? When and how you should change your bandage (dressing). ? When you should remove your dressing. ? Removing whatever was used to close your incision.  Check your incision area every day for signs of infection. Watch for: ? Redness, swelling, or pain. ? Fluid, blood, or pus. Managing pain, stiffness, and swelling  Move your toes often to avoid stiffness and to lessen swelling.  Raise (elevate) the injured area above the level of your heart while you are sitting or lying down.  If directed, apply ice to the injured area (if you have a splint or a boot, not a cast). ? Put ice in a plastic bag. ? Place a towel between your skin and the bag. ? Leave the ice on for 20 minutes, 2-3 times per day. Driving  Do not drive or operate heavy machinery while taking pain medicine.  Do not drive while wearing a cast, splint, or boot on a foot that you use  for driving. Activity  Return to your normal activities as directed by your health care provider. Ask your health care provider what activities are safe for you.  Perform exercises daily as directed by your health care provider or physical therapist. Safety  Do not walk on the injured ankle and do not use it to support your body weight until your health care provider says that you can. Follow these instructions exactly to prevent problems. Use crutches or other supportive devices as directed by your health care provider. General instructions  Do not use any tobacco products, including cigarettes, chewing tobacco, or  e-cigarettes. Tobacco can delay bone healing. If you need help quitting, ask your health care provider.  Take medicines only as directed by your health care provider.  Keep all follow-up visits as directed by your health care provider. This is important. Contact a health care provider if:  You have a fever.  Your pain medicine is not helping.  You have redness, swelling, or pain at the site of your incision.  You have fluid, blood, or pus coming from your incision or seeping through your cast.  You notice a bad smell coming from your incision or your dressing. Get help right away if:  You have chest pain.  You have difficulty breathing.  You have numbness or tingling in your foot or leg.  Your foot becomes cold, pale, or blue. This information is not intended to replace advice given to you by your health care provider. Make sure you discuss any questions you have with your health care provider. Document Released: 08/25/2004 Document Revised: 07/14/2015 Document Reviewed: 12/30/2013 Elsevier Interactive Patient Education  2018 [place] Use, Adult Crutches are used to take weight off of one of your legs or feet when you stand or walk. You may need crutches to help heal after an injury or procedure. It is important to use crutches that fit properly. When fitted properly:  Each crutch should be 2-3 finger widths below the armpit.  Your weight should be supported by your hand, and not by resting the armpit on the crutch.  It is important that a health care provider has seen you use crutches effectively before you use them at home. What are the risks? Improper use of crutches can injure your shoulders, arms, back, armpits, and hands. To prevent this from happening, make sure your crutches fit properly and do not put pressure on your armpits when using them. While using crutches you also have a higher risk of falling. To prevent falls while  using crutches at home or work:  Move furniture or barriers that are in your walkway when possible. Have someone help you with this as needed.  Keep walkways well-lit.  Use a backpack so you do not need to carry items in your hands.  Remove rugs, cords, and other items from the floor that you can trip on.  How to use your crutches How you will use your crutches will depend on the reason you need them. Your health care provider may tell you not to put any weight on the affected leg (non-weight-bearing). Or, your health care provider may allow you to put some, but not all, weight on the affected leg (partial weight-bearing). Follow instructions from your health care provider about weight-bearing. Do not bear weight in an amount that causes pain to the affected area. Walking  1. Stand on your healthy  leg and lift both crutches at the same time. 2. Place the crutches one step-length in front of you. 3. Bring your healthy leg forward to meet, or land slightly ahead of, the crutches. 4. Repeat. Going up steps  If there is no handrail: 1. Step up with the healthy leg. 2. Step up with the crutches and injured leg. 3. Repeat.  If there is a handrail: 1. Hold both crutches in one hand. 2. Place your other hand on the handrail. 3. Place your weight on your arms and step up with your healthy leg. 4. Bring the crutches and the injured leg up to that step. 5. Continue in this way.  If you feel unsteady on steps, you can go up steps on your bottom. To go up, sit on the lowest step with your injured leg in front and holding both crutches flat against the stairs in the other hand. Then use your free hand and your healthy leg for support to scoot your bottom up to the next step. Going down steps  If there is no handrail: 1. Step down with the injured leg and crutches. 2. Step down with the healthy leg. 3. Repeat.  If there is a handrail: 1. Place your hand on the handrail. 2. Hold both  crutches with your free hand. 3. Lower your injured leg and crutch to the step below you. Make sure to keep the crutch tips in the center of the step, not on the edge. 4. Lower your healthy leg down to the next step. 5. Repeat.  If you feel unsteady on steps, you can down steps on your bottom. To go down, sit on the highest step with your injured leg in front and holding both crutches flat against the stairs in the other hand. Then use your free hand and your healthy leg for support to scoot your bottom down to the next step. Standing up  1. Hold the injured leg forward. 2. Grab an armrest with one hand and the top of the crutches with the other hand. 3. Using the armrest and your crutches, pull yourself up to a standing position. Sitting down 1. Hold the injured leg forward. 2. Grab the armrest with one hand and the top of the crutches with the other hand. 3. Slowly lower yourself to a sitting position. Contact a health care provider if:  You feel unsteady using crutches.  You develop any new pain.  You develop any numbness or tingling.  Your crutches do not fit. Get help right away if:  You fall. This information is not intended to replace advice given to you by your health care provider. Make sure you discuss any questions you have with your health care provider. Document Released: 02/03/2000 Document Revised: 10/06/2015 Document Reviewed: 07/29/2015 Elsevier Interactive Patient Education  [employer].

## 2017-05-09 NOTE — Interval H&P Note (Signed)
History and Physical Interval Note:  05/09/2017 9:18 AM  Kara Gentry  has presented today for surgery, with the diagnosis of Bimalleolar closed right ankle fracture  The various methods of treatment have been discussed with the patient and family. After consideration of risks, benefits and other options for treatment, the patient has consented to  Procedure(s): OPEN REDUCTION INTERNAL FIXATION (ORIF) ANKLE FRACTURE (Right) as a surgical intervention .  The patient's history has been reviewed, patient examined, no change in status, stable for surgery.  I have reviewed the patient's chart and labs.  Questions were answered to the patient's satisfaction.     Arther Abbott

## 2017-05-09 NOTE — Brief Op Note (Signed)
05/09/2017  12:02 PM  PATIENT:  Kara Gentry  42 y.o. female  PRE-OPERATIVE DIAGNOSIS:  Bimalleolar closed right ankle fracture  POST-OPERATIVE DIAGNOSIS:  Bimalleolar closed right ankle fracture  PROCEDURE:  Procedure(s): OPEN REDUCTION INTERNAL FIXATION (ORIF) ANKLE FRACTURE (Right)   Operative findings Weber B lateral malleolus fracture with what really amounted to an avulsion fracture of the medial malleolus with a very small fragment with the deep and superficial deltoid ligament avulsed attached to the fragment   We inserted a Stryker ankle solutions 3 hole cluster plate laterally and a 3.5 suture anchor and multiple #2 Ethibond sutures medially   The patient was interviewed and reexamined in the preop area and deemed ready for surgery.  Chart review was completed and the patient was taken to surgery and she was given an LMA general anesthetic and 2 g of Ancef IV.  She had an abrasion on the medial side of the ankle and she was prepped with Betadine  After successful prep and anesthesia the timeout was executed completed  No tourniquet was used:  The patient's lateral incision was made over the lateral malleolus slightly anteriorly anticipation of a interfrag screw.  Subcutaneous tissue was divided down to bone with full-thickness skin flaps  The fracture was identified irrigated cleaned debrided and then manually reduced with traction and internal rotation and held with 2 clamps.  At this point x-rays confirmed reduction of the fracture as well as the mortise  We placed an interfrag screw using AO technique with a 3 5 gliding hole and a 2 5 distal hole.  Standard measuring technique and a screw was placed with a 3 5 fully threaded cortical screw  We follow this with a cluster plate using a 3 hole cluster plate sequentially placing screws starting near the fracture and then progressing outward on each side until all the screw holes except for 2 were placed  Radiographs  confirmed our reduction plate placement and mortise reduction  A sterile moist gauze was placed in the wound and we turned our attention medially  The incision was made slightly more anteriorly than I wanted because of the abrasion.  However we divided that down to the bone debrided the soft tissue exposed the fracture debrided and irrigated the ankle joint which had several pieces of bone and it.  We then attempted reduction with a clamp and although we got a good reduction the fragment was too small for K wire fixation  I then placed a 3 5 suture anchor in place holes in the fragment to try to get it to reduce and that was also unsuccessful.  I then placed holes in the actual tibia and passed several sutures into the deep and superficial deltoid ligament which reduced the fracture.  We confirmed our reduction with x-ray while tying the sutures down with the heel inverted  It should be noted that the deep deltoid ligament had folded into the joint  Final radiographs 3 views confirmed reduction late fixation reduction of the mortise as well.  We irrigated the wounds closed the medial side with 3-0 nylon suture in interrupted fashion closed lateral side with 0 Monocryl in 2-0 Monocryl suture  We used staples to reapproximate the skin edges  We did a deep injection laterally with 30 cc and then we did a field block with 15 cc medially  Sterile dressings and sugar tong splint were applied  Patient was extubated and taken to recovery room in stable condition  Note midway through  the procedure the patient was converted to intubation with standard technique versus LMA.  Postop plan 6 weeks in a cast to allow the deltoid ligament to fully heal and then she can go on a Cam walker with active range of motion exercises  We will keep her nonweightbearing for the first 6 weeks and then progressive weightbearing as tolerated from weeks 7 through 12  General anesthesia conversion from LMA to  general  Assisted by Simonne Maffucci  No tourniquet was used  We did inject Marcaine with epinephrine deep on the lateral side and field block of the medial side    SURGEON:  Surgeon(s) and Role:    * Aline Brochure, Tim Lair, MD - Primary  EBL:  100 mL   BLOOD ADMINISTERED:none  DRAINS: none   LOCAL MEDICATIONS USED:  MARCAINE     SPECIMEN:  No Specimen  DISPOSITION OF SPECIMEN:  N/A  COUNTS:  YES  TOURNIQUET:  * Missing tourniquet times found for documented tourniquets in log: [id number] *  DICTATION: .Dragon Dictation  PLAN OF CARE: Discharge to home after PACU  PATIENT DISPOSITION:  PACU - hemodynamically stable.   Delay start of Pharmacological VTE agent (>24hrs) due to surgical blood loss or risk of bleeding: not applicable

## 2017-05-09 NOTE — Op Note (Signed)
05/09/2017  12:02 PM  PATIENT:  Kara Gentry  42 y.o. female  PRE-OPERATIVE DIAGNOSIS:  Bimalleolar closed right ankle fracture  POST-OPERATIVE DIAGNOSIS:  Bimalleolar closed right ankle fracture  PROCEDURE:  Procedure(s): OPEN REDUCTION INTERNAL FIXATION (ORIF) ANKLE FRACTURE (Right)   Operative findings Weber B lateral malleolus fracture with what really amounted to an avulsion fracture of the medial malleolus with a very small fragment with the deep and superficial deltoid ligament avulsed attached to the fragment   We inserted a Stryker ankle solutions 3 hole cluster plate laterally and a 3.5 suture anchor and multiple #2 Ethibond sutures medially   The patient was interviewed and reexamined in the preop area and deemed ready for surgery.  Chart review was completed and the patient was taken to surgery and she was given an LMA general anesthetic and 2 g of Ancef IV.  She had an abrasion on the medial side of the ankle and she was prepped with Betadine  After successful prep and anesthesia the timeout was executed completed  No tourniquet was used:  The patient's lateral incision was made over the lateral malleolus slightly anteriorly anticipation of a interfrag screw.  Subcutaneous tissue was divided down to bone with full-thickness skin flaps  The fracture was identified irrigated cleaned debrided and then manually reduced with traction and internal rotation and held with 2 clamps.  At this point x-rays confirmed reduction of the fracture as well as the mortise  We placed an interfrag screw using AO technique with a 3 5 gliding hole and a 2 5 distal hole.  Standard measuring technique and a screw was placed with a 3 5 fully threaded cortical screw  We follow this with a cluster plate using a 3 hole cluster plate sequentially placing screws starting near the fracture and then progressing outward on each side until all the screw holes except for 2 were placed  Radiographs  confirmed our reduction plate placement and mortise reduction  A sterile moist gauze was placed in the wound and we turned our attention medially  The incision was made slightly more anteriorly than I wanted because of the abrasion.  However we divided that down to the bone debrided the soft tissue exposed the fracture debrided and irrigated the ankle joint which had several pieces of bone and it.  We then attempted reduction with a clamp and although we got a good reduction the fragment was too small for K wire fixation  I then placed a 3 5 suture anchor in place holes in the fragment to try to get it to reduce and that was also unsuccessful.  I then placed holes in the actual tibia and passed several sutures into the deep and superficial deltoid ligament which reduced the fracture.  We confirmed our reduction with x-ray while tying the sutures down with the heel inverted  It should be noted that the deep deltoid ligament had folded into the joint  Final radiographs 3 views confirmed reduction late fixation reduction of the mortise as well.  We irrigated the wounds closed the medial side with 3-0 nylon suture in interrupted fashion closed lateral side with 0 Monocryl in 2-0 Monocryl suture  We used staples to reapproximate the skin edges  We did a deep injection laterally with 30 cc and then we did a field block with 15 cc medially  Sterile dressings and sugar tong splint were applied  Patient was extubated and taken to recovery room in stable condition  Note midway through  the procedure the patient was converted to intubation with standard technique versus LMA.  Postop plan 6 weeks in a cast to allow the deltoid ligament to fully heal and then she can go on a Cam walker with active range of motion exercises  We will keep her nonweightbearing for the first 6 weeks and then progressive weightbearing as tolerated from weeks 7 through 12  General anesthesia conversion from LMA to  general  Assisted by Simonne Maffucci  No tourniquet was used  We did inject Marcaine with epinephrine deep on the lateral side and field block of the medial side    SURGEON:  Surgeon(s) and Role:    * Aline Brochure, Tim Lair, MD - Primary  EBL:  100 mL   BLOOD ADMINISTERED:none  DRAINS: none   LOCAL MEDICATIONS USED:  MARCAINE     SPECIMEN:  No Specimen  DISPOSITION OF SPECIMEN:  N/A  COUNTS:  YES  TOURNIQUET:  * Missing tourniquet times found for documented tourniquets in log: [id number] *  DICTATION: .Dragon Dictation  PLAN OF CARE: Discharge to home after PACU  PATIENT DISPOSITION:  PACU - hemodynamically stable.   Delay start of Pharmacological VTE agent (>24hrs) due to surgical blood loss or risk of bleeding: not applicable   [id number]

## 2017-05-09 NOTE — Anesthesia Postprocedure Evaluation (Signed)
Anesthesia Post Note  Patient: Kara Gentry  Procedure(s) Performed: OPEN REDUCTION INTERNAL FIXATION (ORIF) ANKLE FRACTURE (Right Ankle)  Patient location during evaluation: PACU Anesthesia Type: General Pain management: pain level controlled Vital Signs Assessment: post-procedure vital signs reviewed and stable Respiratory status: spontaneous breathing, nonlabored ventilation, respiratory function stable and patient connected to face mask oxygen Cardiovascular status: stable Postop Assessment: no apparent nausea or vomiting Anesthetic complications: no     Last Vitals:  Vitals Value Taken Time  BP 126/90 05/09/2017 12:30 PM  Temp    Pulse 68 05/09/2017 12:38 PM  Resp 17 05/09/2017 12:38 PM  SpO2 100 % 05/09/2017 12:38 PM  Vitals shown include unvalidated device data.  Last Pain:  Vitals:   05/09/17 1230  TempSrc:   PainSc: (P) Asleep                 Mischa Pollard

## 2017-05-09 NOTE — Anesthesia Procedure Notes (Signed)
Procedure Name: LMA Insertion Date/Time: 05/09/2017 9:34 AM Performed by: Jonna Munro, CRNA Pre-anesthesia Checklist: Patient identified, Suction available, Emergency Drugs available, Patient being monitored and Timeout performed Patient Re-evaluated:Patient Re-evaluated prior to induction Oxygen Delivery Method: Circle system utilized Preoxygenation: Pre-oxygenation with 100% oxygen Induction Type: IV induction LMA: LMA inserted LMA Size: 4.0 Number of attempts: 1 Placement Confirmation: positive ETCO2 and breath sounds checked- equal and bilateral Tube secured with: Tape Dental Injury: Teeth and Oropharynx as per pre-operative assessment

## 2017-05-09 NOTE — Anesthesia Preprocedure Evaluation (Signed)
Anesthesia Evaluation  Patient identified by MRN, date of birth, ID band Patient awake    Reviewed: Allergy & Precautions, H&P , NPO status , Patient's Chart, lab work & pertinent test results  History of Anesthesia Complications (+) PONV and history of anesthetic complications  Airway Mallampati: II   Neck ROM: Full    Dental  (+) Teeth Intact   Pulmonary neg pulmonary ROS,    breath sounds clear to auscultation       Cardiovascular negative cardio ROS   Rhythm:Regular     Neuro/Psych  Headaches,    GI/Hepatic Neg liver ROS, GERD  Controlled and Medicated,  Endo/Other  negative endocrine ROS  Renal/GU negative Renal ROS     Musculoskeletal   Abdominal   Peds  Hematology negative hematology ROS (+)   Anesthesia Other Findings   Reproductive/Obstetrics                             Anesthesia Physical Anesthesia Plan  ASA: II  Anesthesia Plan: General   Post-op Pain Management:    Induction: Intravenous  PONV Risk Score and Plan: Dexamethasone, Ondansetron and Scopolamine patch - Pre-op  Airway Management Planned: LMA  Additional Equipment:   Intra-op Plan:   Post-operative Plan: Extubation in OR  Informed Consent: I have reviewed the patients History and Physical, chart, labs and discussed the procedure including the risks, benefits and alternatives for the proposed anesthesia with the patient or authorized representative who has indicated his/her understanding and acceptance.     Plan Discussed with:   Anesthesia Plan Comments:         Anesthesia Quick Evaluation

## 2017-05-09 NOTE — Transfer of Care (Signed)
Immediate Anesthesia Transfer of Care Note  Patient: Kara Gentry  Procedure(s) Performed: OPEN REDUCTION INTERNAL FIXATION (ORIF) ANKLE FRACTURE (Right Ankle)  Patient Location: PACU  Anesthesia Type:General  Level of Consciousness: awake, alert  and oriented  Airway & Oxygen Therapy: Patient Spontanous Breathing and Patient connected to face mask oxygen  Post-op Assessment: Report given to RN and Post -op Vital signs reviewed and stable  Post vital signs: Reviewed and stable  Last Vitals:  Vitals Value Taken Time  BP 140/97 05/09/2017 12:17 PM  Temp    Pulse 89 05/09/2017 12:20 PM  Resp 22 05/09/2017 12:20 PM  SpO2 100 % 05/09/2017 12:20 PM  Vitals shown include unvalidated device data.  Last Pain:  Vitals:   05/09/17 0820  TempSrc: Oral  PainSc: 0-No pain         Complications: No apparent anesthesia complications

## 2017-05-10 ENCOUNTER — Encounter (HOSPITAL_COMMUNITY): Payer: Self-pay | Admitting: Orthopedic Surgery

## 2017-05-16 ENCOUNTER — Encounter: Payer: Self-pay | Admitting: Orthopedic Surgery

## 2017-05-17 ENCOUNTER — Other Ambulatory Visit: Payer: Self-pay | Admitting: Orthopedic Surgery

## 2017-05-17 MED ORDER — OXYCODONE-ACETAMINOPHEN 5-325 MG PO TABS: 1.0000 | ORAL_TABLET | ORAL | 0 refills | Status: DC | PRN

## 2017-05-17 NOTE — Telephone Encounter (Signed)
Patient requests refill on Oxycodone/Acetaminophen 5-325 mgs.  Qty  30  Sig: Take 1-2 tablets by mouth every 4 (four) hours as needed for severe pain.  Patient states she uses [facility]    I told her that ordinarily prescription refills are not done on Fridays.  She said that she didn't know this but that she just had ORIF of the right ankle last week.  (Thursday, 05-09-17).  She said she only has 3 pills left.  I told her that I would put in the request this time only.

## 2017-05-23 ENCOUNTER — Encounter: Payer: Self-pay | Admitting: Orthopedic Surgery

## 2017-05-23 ENCOUNTER — Ambulatory Visit (INDEPENDENT_AMBULATORY_CARE_PROVIDER_SITE_OTHER): Payer: BC Managed Care – PPO

## 2017-05-23 ENCOUNTER — Ambulatory Visit (INDEPENDENT_AMBULATORY_CARE_PROVIDER_SITE_OTHER): Payer: Self-pay | Admitting: Orthopedic Surgery

## 2017-05-23 VITALS — BP 145/96 | HR 79 | Ht 62.0 in | Wt 184.0 lb

## 2017-05-23 DIAGNOSIS — Z967 Presence of other bone and tendon implants: Secondary | ICD-10-CM

## 2017-05-23 DIAGNOSIS — Z8781 Personal history of (healed) traumatic fracture: Secondary | ICD-10-CM

## 2017-05-23 DIAGNOSIS — Z9889 Other specified postprocedural states: Secondary | ICD-10-CM

## 2017-05-23 DIAGNOSIS — S82841D Displaced bimalleolar fracture of right lower leg, subsequent encounter for closed fracture with routine healing: Secondary | ICD-10-CM

## 2017-05-23 NOTE — Progress Notes (Signed)
POSTOP VISIT  Status post  orif ankle  POD # 15  BP (!) 145/96   Pulse 79   Ht 5\' 2"  (1.575 m)   Wt 184 lb (83.5 kg)   BMI 33.65 kg/m   Encounter Diagnosis  Name Primary?  . S/P ORIF (open reduction internal fixation) fracture 05/09/17 Ankle  Right  Yes    The surgical site incision clean dry and intact with no drainage  The neurovascular examination of the extremity is normal in terms of sensation pulse and perfusion  Postoperative plan  Aircast active range of motion x-rays in 4 weeks  Continue out of work through May 3  Continue nonweightbearing  On return visit if x-rays are good wound is good plan on Cam Walker weightbearing as tolerated

## 2017-05-23 NOTE — Patient Instructions (Signed)
oow to may 4

## 2017-05-27 DIAGNOSIS — S82841A Displaced bimalleolar fracture of right lower leg, initial encounter for closed fracture: Secondary | ICD-10-CM | POA: Insufficient documentation

## 2017-05-27 HISTORY — DX: Displaced bimalleolar fracture of right lower leg, initial encounter for closed fracture: S82.841A

## 2017-06-21 ENCOUNTER — Encounter: Payer: Self-pay | Admitting: Orthopedic Surgery

## 2017-06-21 ENCOUNTER — Ambulatory Visit (INDEPENDENT_AMBULATORY_CARE_PROVIDER_SITE_OTHER): Payer: BC Managed Care – PPO | Admitting: Orthopedic Surgery

## 2017-06-21 ENCOUNTER — Ambulatory Visit (INDEPENDENT_AMBULATORY_CARE_PROVIDER_SITE_OTHER): Payer: BC Managed Care – PPO

## 2017-06-21 VITALS — BP 145/96 | HR 77 | Ht 62.0 in

## 2017-06-21 DIAGNOSIS — Z8781 Personal history of (healed) traumatic fracture: Secondary | ICD-10-CM

## 2017-06-21 DIAGNOSIS — Z967 Presence of other bone and tendon implants: Secondary | ICD-10-CM

## 2017-06-21 DIAGNOSIS — Z9889 Other specified postprocedural states: Secondary | ICD-10-CM

## 2017-06-21 NOTE — Progress Notes (Signed)
Postoperative visit fracture care  Chief Complaint  Patient presents with  . Post-op Follow-up    right ankle ORIF on 05/09/17   Postop day number 33-week #6  Her incisions look good she is been in a splint she has Achilles tightness she can just about get the foot plantigrade position with a slight contracture  X-rays look great  Patient was placed in a short leg walking cast for 3 weeks and then reassess Achilles tightening whether another cast is needed or we can start therapy  Work status: out until June 1   Weightbearing as tolerated

## 2017-06-21 NOTE — Patient Instructions (Signed)
No work until jun 1

## 2017-06-28 ENCOUNTER — Ambulatory Visit (INDEPENDENT_AMBULATORY_CARE_PROVIDER_SITE_OTHER): Payer: BC Managed Care – PPO | Admitting: Orthopedic Surgery

## 2017-06-28 ENCOUNTER — Encounter: Payer: Self-pay | Admitting: Orthopedic Surgery

## 2017-06-28 VITALS — BP 130/86 | HR 86 | Ht 62.0 in | Wt 184.0 lb

## 2017-06-28 DIAGNOSIS — Z9889 Other specified postprocedural states: Secondary | ICD-10-CM

## 2017-06-28 DIAGNOSIS — S82841D Displaced bimalleolar fracture of right lower leg, subsequent encounter for closed fracture with routine healing: Secondary | ICD-10-CM

## 2017-06-28 DIAGNOSIS — Z8781 Personal history of (healed) traumatic fracture: Secondary | ICD-10-CM

## 2017-06-28 DIAGNOSIS — Z967 Presence of other bone and tendon implants: Secondary | ICD-10-CM

## 2017-06-28 NOTE — Patient Instructions (Signed)
Weight-bear as tolerated in the cam walker  100 200 ankle exercises daily  Shower okay

## 2017-06-28 NOTE — Progress Notes (Signed)
POSTOP VISIT  POD #50  BP 130/86   Pulse 86   Ht 5\' 2"  (1.575 m)   Wt 184 lb (83.5 kg)   BMI 33.65 kg/m   Encounter Diagnoses  Name Primary?  . S/P ORIF (open reduction internal fixation) fracture 05/09/17 Ankle  Right  Yes  . Closed bimalleolar fracture of right ankle with routine healing, subsequent encounter     Patient was casted to help with Achilles stretching now her foot is at plantigrade position  The surgical site incision clean dry and intact with no drainage, needs a Band-Aid medial side one area of bloody drainage The neurovascular examination of the extremity is normal in terms of sensation pulse and perfusion  Postoperative plan   conversion to CAM Walker weight-bear as tolerated follow-up on the 31st x-rays out of plaster

## 2017-07-19 ENCOUNTER — Ambulatory Visit (INDEPENDENT_AMBULATORY_CARE_PROVIDER_SITE_OTHER): Payer: BC Managed Care – PPO

## 2017-07-19 ENCOUNTER — Ambulatory Visit (INDEPENDENT_AMBULATORY_CARE_PROVIDER_SITE_OTHER): Payer: Self-pay | Admitting: Orthopedic Surgery

## 2017-07-19 ENCOUNTER — Encounter: Payer: Self-pay | Admitting: Orthopedic Surgery

## 2017-07-19 VITALS — BP 117/78 | HR 75 | Ht 62.0 in | Wt 184.0 lb

## 2017-07-19 DIAGNOSIS — Z967 Presence of other bone and tendon implants: Secondary | ICD-10-CM | POA: Diagnosis not present

## 2017-07-19 DIAGNOSIS — Z8781 Personal history of (healed) traumatic fracture: Secondary | ICD-10-CM

## 2017-07-19 DIAGNOSIS — Z9889 Other specified postprocedural states: Secondary | ICD-10-CM

## 2017-07-19 DIAGNOSIS — S82841D Displaced bimalleolar fracture of right lower leg, subsequent encounter for closed fracture with routine healing: Secondary | ICD-10-CM

## 2017-07-19 NOTE — Progress Notes (Signed)
POSTOP VISIT  POD # 58  Chief Complaint  Patient presents with  . Follow-up    Recheck on left ankle, DOS 05-09-17.    42 years old bimalleolar fracture treated with open treatment internal fixation currently in a Cam walker with a crutch or walker as needed weightbearing as tolerated   Encounter Diagnoses  Name Primary?  . S/P ORIF (open reduction internal fixation) fracture 05/09/17 Ankle  Right  Yes  . Closed bimalleolar fracture of right ankle with routine healing, subsequent encounter    She has regained her range of motion especially her dorsiflexion is good she does complain of some popping occasionally with dorsiflexion of the foot appears to be over the anterior joint line.  Her wounds look clean dry and intact her x-ray looks great her fracture is healing appropriately  Postoperative plan (Work, WB, No orders of the defined types were placed in this encounter. ,FU)  RTW is June fifth  Follow-up is 1 month

## 2017-07-19 NOTE — Patient Instructions (Signed)
Weight-bear as tolerated in the cam walker okay to try to walk in the house without the cam walker  Return to work June 5  Return to office in 1 month

## 2017-08-16 ENCOUNTER — Encounter: Payer: Self-pay | Admitting: Orthopedic Surgery

## 2017-08-16 ENCOUNTER — Ambulatory Visit: Payer: BC Managed Care – PPO | Admitting: Orthopedic Surgery

## 2017-08-16 VITALS — BP 126/85 | HR 82 | Ht 62.0 in | Wt 183.0 lb

## 2017-08-16 DIAGNOSIS — Z8781 Personal history of (healed) traumatic fracture: Secondary | ICD-10-CM

## 2017-08-16 DIAGNOSIS — Z967 Presence of other bone and tendon implants: Secondary | ICD-10-CM

## 2017-08-16 DIAGNOSIS — Z9889 Other specified postprocedural states: Secondary | ICD-10-CM

## 2017-08-16 NOTE — Progress Notes (Signed)
Chief Complaint  Patient presents with  . Post-op Follow-up    right ankle ORIF 05/09/17   Routine follow-up  Patient had several minor issues she was worried about.  She saw some swelling in the foot that goes down with elevation.  She was worried about some balance issues and some back pain and knee pain.  Her ankle looks good her foot has no swelling both incisions healed she can dorsiflex past 90 degrees she plantar flexes 20 degrees her ankle feels stable   Encounter Diagnosis  Name Primary?  . S/P ORIF (open reduction internal fixation) fracture 05/09/17 Ankle  Right  Yes    She can go into normal shoes follow-up with [place] in 9 months for 1 year x-ray follow-up if any problems

## 2017-09-05 ENCOUNTER — Other Ambulatory Visit: Payer: Self-pay | Admitting: Obstetrics and Gynecology

## 2017-09-05 DIAGNOSIS — Z1231 Encounter for screening mammogram for malignant neoplasm of breast: Secondary | ICD-10-CM

## 2017-09-11 ENCOUNTER — Inpatient Hospital Stay (HOSPITAL_COMMUNITY): Admission: RE | Admit: 2017-09-11 | Payer: BC Managed Care – PPO | Source: Ambulatory Visit

## 2017-09-12 ENCOUNTER — Ambulatory Visit (HOSPITAL_COMMUNITY)
Admission: RE | Admit: 2017-09-12 | Discharge: 2017-09-12 | Disposition: A | Discharge status: Home / Self Care | Payer: BC Managed Care – PPO | Source: Ambulatory Visit | Attending: Obstetrics and Gynecology | Admitting: Obstetrics and Gynecology

## 2017-09-12 DIAGNOSIS — Z1231 Encounter for screening mammogram for malignant neoplasm of breast: Secondary | ICD-10-CM

## 2017-11-06 ENCOUNTER — Ambulatory Visit (INDEPENDENT_AMBULATORY_CARE_PROVIDER_SITE_OTHER): Payer: BC Managed Care – PPO

## 2017-11-06 ENCOUNTER — Ambulatory Visit: Payer: BC Managed Care – PPO | Admitting: Orthopedic Surgery

## 2017-11-06 ENCOUNTER — Encounter: Payer: Self-pay | Admitting: Orthopedic Surgery

## 2017-11-06 VITALS — BP 117/75 | HR 73 | Ht 62.0 in | Wt 185.0 lb

## 2017-11-06 DIAGNOSIS — Z9889 Other specified postprocedural states: Secondary | ICD-10-CM

## 2017-11-06 DIAGNOSIS — Z8781 Personal history of (healed) traumatic fracture: Secondary | ICD-10-CM | POA: Diagnosis not present

## 2017-11-06 DIAGNOSIS — M25471 Effusion, right ankle: Secondary | ICD-10-CM | POA: Diagnosis not present

## 2017-11-06 DIAGNOSIS — Z967 Presence of other bone and tendon implants: Secondary | ICD-10-CM

## 2017-11-06 DIAGNOSIS — S82841S Displaced bimalleolar fracture of right lower leg, sequela: Secondary | ICD-10-CM | POA: Diagnosis not present

## 2017-11-06 MED ORDER — NAPROXEN 500 MG PO TABS: 500.0000 mg | ORAL_TABLET | Freq: Two times a day (BID) | ORAL | 2 refills | Status: DC

## 2017-11-06 NOTE — Progress Notes (Signed)
Chief Complaint  Patient presents with  . 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  She is return to work she complains of intermittent pain swelling and throbbing right ankle since she went back to school mid August with a dull throbbing sensation stiffness of the ankle especially after being up on it for a long time although the swelling will resolve at night  System review skin normal neurologic symptoms no numbness or tingling she does report some unsteady gait but that is secondary to swelling  Past Medical History:  Diagnosis Date  . Anemia   . Bursitis    Right shoulder  . Cancer ([place])    soft tissue of left cheeck  . Fibroids 05/31/2016  . Headache   . Hemorrhoids   . PONV (postoperative nausea and vomiting)   . Seasonal allergies   . Vitamin D deficiency 05/23/2016   Take 5000 IU vitamin D3 daily    BP 117/75   Pulse 73   Ht 5\' 2"  (1.575 m)   Wt 185 lb (83.9 kg)   BMI 33.84 kg/m   Appearance is normal. Oriented x3.  Mood pleasant affect normal.  Gait I do not detect a noticeable limp. Right ankle medial lateral incision healed well skin intact no erythema plantar flexion dorsiflexion strength seem normal eversion strength a little weak anterior drawer test normal active range of motion at 15 degrees dorsiflexion 10 plantarflexion no change on passive range of motion no tenderness except over the medial malleolus pulse and temperature in the ankle and foot are normal there are no sensory deficits her balance is good  X-rays were taken today See the report  The fracture healed the ankle mortise is intact mild soft tissue swelling medially  I do not see anything particularly wrong here.  I do note that we had to fix the medial malleolus with suture anchor fixation but that looks like it is in good position  This appears to be activity related  dependent edema  We recommend an NSAID, and elevation  Return in 6 weeks recheck  Meds ordered this encounter  Medications  . naproxen (NAPROSYN) 500 MG tablet    Sig: Take 1 tablet (500 mg total) by mouth 2 (two) times daily with a meal.    Dispense:  60 tablet    Refill:  2    Encounter Diagnoses  Name Primary?  . Closed bimalleolar fracture of right ankle, sequela Yes  . Right ankle swelling

## 2017-12-18 ENCOUNTER — Ambulatory Visit: Payer: BC Managed Care – PPO | Admitting: Orthopedic Surgery

## 2017-12-18 ENCOUNTER — Encounter: Payer: Self-pay | Admitting: Orthopedic Surgery

## 2017-12-18 VITALS — BP 106/66 | HR 77 | Ht 62.0 in | Wt 178.0 lb

## 2017-12-18 DIAGNOSIS — Z8781 Personal history of (healed) traumatic fracture: Secondary | ICD-10-CM

## 2017-12-18 DIAGNOSIS — M25671 Stiffness of right ankle, not elsewhere classified: Secondary | ICD-10-CM

## 2017-12-18 DIAGNOSIS — Z9889 Other specified postprocedural states: Secondary | ICD-10-CM | POA: Diagnosis not present

## 2017-12-18 NOTE — Patient Instructions (Signed)
Stretch achilles daily

## 2017-12-18 NOTE — Progress Notes (Signed)
Chief Complaint  Patient presents with  . Ankle Pain    Right ankle DOS 05/09/17    ORIF RT ANKLE   C/O STIFFNESS  Right ankle wounds healed nicely.  No signs of infection.  Mild swelling medial lateral ankle no tenderness at the fracture site.  Achilles tendon is definitely tight comparing right to left  Recommend Achilles stretching as instructed  Encounter Diagnoses  Name Primary?  . S/P ORIF (open reduction internal fixation) fracture 05/09/17 Ankle  Right  Yes  . Ankle stiffness, right    Follow-up in 2 months

## 2018-02-17 ENCOUNTER — Ambulatory Visit: Payer: BC Managed Care – PPO | Admitting: Orthopedic Surgery

## 2018-02-21 ENCOUNTER — Encounter: Payer: Self-pay | Admitting: Orthopedic Surgery

## 2018-02-21 ENCOUNTER — Ambulatory Visit: Payer: BC Managed Care – PPO | Admitting: Orthopedic Surgery

## 2018-02-21 VITALS — BP 121/73 | HR 82 | Ht 62.0 in | Wt 175.0 lb

## 2018-02-21 DIAGNOSIS — Z9889 Other specified postprocedural states: Secondary | ICD-10-CM | POA: Diagnosis not present

## 2018-02-21 DIAGNOSIS — Z8781 Personal history of (healed) traumatic fracture: Secondary | ICD-10-CM | POA: Diagnosis not present

## 2018-02-21 NOTE — Progress Notes (Signed)
Chief Complaint  Patient presents with  . Ankle Pain    Right DOS 05/09/17    Status post open treatment internal fixation right ankle May 09, 2017 the patient was advised to start on Achilles tendon stretching which she did although not as often as we would like  However her range of motion is improved her ankle does not swell as much or as often her pain is well controlled she is ambulating without assistive devices  Ankle range of motion is now within 5 degrees of the opposite side in all directions there is no swelling or tenderness  We will see her in March for the one-year x-ray and final evaluation  Encounter Diagnosis  Name Primary?  . S/P ORIF (open reduction internal fixation) fracture 05/09/17 Ankle  Right  Yes

## 2018-04-30 ENCOUNTER — Other Ambulatory Visit: Payer: Self-pay

## 2018-04-30 ENCOUNTER — Ambulatory Visit (INDEPENDENT_AMBULATORY_CARE_PROVIDER_SITE_OTHER): Payer: BC Managed Care – PPO

## 2018-04-30 ENCOUNTER — Ambulatory Visit: Payer: BC Managed Care – PPO | Admitting: Orthopedic Surgery

## 2018-04-30 ENCOUNTER — Encounter: Payer: Self-pay | Admitting: Orthopedic Surgery

## 2018-04-30 VITALS — BP 112/68 | HR 82 | Ht 62.0 in | Wt 172.0 lb

## 2018-04-30 DIAGNOSIS — Z9889 Other specified postprocedural states: Secondary | ICD-10-CM | POA: Diagnosis not present

## 2018-04-30 DIAGNOSIS — Z8781 Personal history of (healed) traumatic fracture: Secondary | ICD-10-CM

## 2018-04-30 NOTE — Progress Notes (Signed)
Progress Note   Patient ID: Kara Gentry, female   DOB: 05/21/1975, 43 y.o.   MRN: [id number]   Chief Complaint  Patient presents with  . Post-op Follow-up    right ankle ORIF 05/09/17    ORIF of the right ankle, 1 year follow-up, her complaints are of swelling intermittently    Review of Systems  Musculoskeletal: Negative.   Neurological: Negative.      No Known Allergies   BP 112/68   Pulse 82   Ht 5\' 2"  (1.575 m)   Wt 172 lb (78 kg)   BMI 31.46 kg/m   Physical Exam Vitals signs reviewed.  Constitutional:      Appearance: Normal appearance. She is well-developed.  Musculoskeletal:       Feet:  Neurological:     Mental Status: She is alert and oriented to person, place, and time.     Gait: Gait is intact.  Psychiatric:        Attention and Perception: Attention normal.        Mood and Affect: Mood and affect normal.        Speech: Speech normal.        Behavior: Behavior normal.        Thought Content: Thought content normal.        Judgment: Judgment normal.      Medical decisions:   Data  Imaging:   Ankle x-ray shows a stable mortise intact with no arthritis she has a fibrous union with suture anchor of the medial malleolar fragment  Encounter Diagnosis  Name Primary?  . S/P ORIF (open reduction internal fixation) fracture 05/09/17 Ankle  Right  Yes    PLAN:   Normal activities follow-up as needed call us if any problems released    Arther Abbott, MD 04/30/2018 4:14 PM

## 2018-05-19 ENCOUNTER — Ambulatory Visit: Payer: BC Managed Care – PPO | Admitting: Orthopedic Surgery

## 2018-08-19 ENCOUNTER — Other Ambulatory Visit (HOSPITAL_COMMUNITY): Payer: Self-pay | Admitting: Obstetrics and Gynecology

## 2018-08-19 DIAGNOSIS — Z1231 Encounter for screening mammogram for malignant neoplasm of breast: Secondary | ICD-10-CM

## 2018-09-22 ENCOUNTER — Other Ambulatory Visit: Payer: Self-pay

## 2018-09-22 ENCOUNTER — Ambulatory Visit (HOSPITAL_COMMUNITY)
Admission: RE | Admit: 2018-09-22 | Discharge: 2018-09-22 | Disposition: A | Discharge status: Home / Self Care | Payer: BC Managed Care – PPO | Source: Ambulatory Visit | Attending: Obstetrics and Gynecology | Admitting: Obstetrics and Gynecology

## 2018-09-22 DIAGNOSIS — Z1231 Encounter for screening mammogram for malignant neoplasm of breast: Secondary | ICD-10-CM | POA: Diagnosis not present

## 2018-09-22 LAB — HM MAMMOGRAPHY

## 2018-10-16 ENCOUNTER — Telehealth: Payer: Self-pay | Admitting: Obstetrics and Gynecology

## 2018-10-16 ENCOUNTER — Telehealth: Payer: Self-pay | Admitting: *Deleted

## 2018-10-16 NOTE — Telephone Encounter (Signed)
Pt states she had an ablation 2 years ago and is needing to discuss with Dr. Glo Herring issues that she is having again.

## 2018-10-16 NOTE — Telephone Encounter (Signed)
Patient called stating she has been tracking her pelvic pain and spotting over the last couple of months.  She has had spotting monthly but this month had pelvic pain associated with it.  Advised patient to make an appointment with Dr Glo Herring for evaluation.  Pt agreeable to plan and appointment made.

## 2018-10-23 ENCOUNTER — Ambulatory Visit (INDEPENDENT_AMBULATORY_CARE_PROVIDER_SITE_OTHER): Payer: BC Managed Care – PPO | Admitting: Obstetrics and Gynecology

## 2018-10-23 ENCOUNTER — Other Ambulatory Visit: Payer: Self-pay

## 2018-10-23 ENCOUNTER — Encounter: Payer: Self-pay | Admitting: Obstetrics and Gynecology

## 2018-10-23 VITALS — BP 107/71 | HR 62 | Ht 62.0 in | Wt 177.2 lb

## 2018-10-23 DIAGNOSIS — R102 Pelvic and perineal pain: Secondary | ICD-10-CM | POA: Diagnosis not present

## 2018-10-23 MED ORDER — MEGESTROL ACETATE 40 MG PO TABS: 40.0000 mg | ORAL_TABLET | Freq: Three times a day (TID) | ORAL | 2 refills | Status: DC

## 2018-10-23 NOTE — Progress Notes (Addendum)
Patient ID: Kara Gentry, female   DOB: 02/28/1975, 43 y.o.   MRN: [id number]    [facility] Visit  @DATE @            Patient name: Kara Gentry MRN [id number]  Date of birth: 08-13-1975  CC & HPI:  Kara Gentry is a 43 y.o. female presenting today for pelvic pain and spotting. She experienced spotting last week that lasted for 3 days. When she used toilet paper, she noticed some blood, but did not notice it on her underwear. She describes the blood as "brown and sticky looking." The pt also had some pain, but it did not feel like regular period cramps.  The patient denies fever, chills or any other symptoms or complaints at this time.    ROS:  ROS  +pelvic pain +spotting +cramping -fever -chills All systems are negative except as noted in the HPI and PMH.   Pertinent History Reviewed:  Surgical history significant for cesarean section for twin pregnancy and a pre-term gestation after P PROM.  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 Anemia   . Bursitis    Right shoulder  . Cancer ([place])    soft tissue of left cheeck  . Fibroids 05/31/2016  . Headache   . Hemorrhoids   . PONV (postoperative nausea and vomiting)   . Seasonal allergies   . Vitamin D deficiency 05/23/2016   Take 5000 IU vitamin D3 daily                              Surgical Hx:    Past Surgical History:  Procedure Laterality Date  . APPLICATION OF WOUND VAC  01/03/2012   Procedure: APPLICATION OF WOUND VAC;  Surgeon: Jonnie Kind, MD;  Location: [facility];  Service: Gynecology;  Laterality: N/A;  . APPLICATION OF WOUND VAC  01/05/2012   Procedure: APPLICATION OF WOUND VAC;  Surgeon: Jonnie Kind, MD;  [facility]: [facility];  Service: Gynecology;  Laterality: N/A;  . APPLICATION OF WOUND VAC  01/07/2012    Procedure: APPLICATION OF WOUND VAC;  Surgeon: Jonnie Kind, MD;  [facility]: [facility];  Service: Gynecology;;  . APPLICATION OF WOUND VAC  01/11/2012   Procedure: APPLICATION OF WOUND VAC;  Surgeon: Jonnie Kind, MD;  [facility]: [facility];  Service: Gynecology;  Laterality: N/A;  Fasicial Closure  . CESAREAN SECTION  12/22/2011   Procedure: CESAREAN SECTION;  Surgeon: Donnamae Jude, MD;  Location: [facility];  Service: Obstetrics;  Laterality: N/A;  Primary cesarean section with delivery of Baby A girl at 24. Baby B girl at 24.  Marland Kitchen DILITATION & CURRETTAGE/HYSTROSCOPY WITH NOVASURE ABLATION N/A 09/04/2016   Procedure: HYSTEROSCOPY WITH NOVASURE ABLATION;  Surgeon: Jonnie Kind, MD;  [facility]: [facility];  Service: Gynecology;  Laterality: N/A;  . ECTOPIC PREGNANCY SURGERY    . INCISION AND DRAINAGE OF WOUND  01/07/2012   Procedure: IRRIGATION AND DEBRIDEMENT WOUND;  Surgeon: Jonnie Kind, MD;  Location: [facility];  Service: Gynecology;;  . INSERTION OF MESH  01/03/2012   Procedure: INSERTION OF MESH;  Surgeon: Jonnie Kind, MD;  [facility];  Service: Gynecology;  Laterality: N/A;  .  MANDIBLE SURGERY Right cancer   Cancerous mass  . NASAL SINUS SURGERY    . ORIF ANKLE FRACTURE Right 05/09/2017   Procedure: OPEN REDUCTION INTERNAL FIXATION (ORIF) ANKLE FRACTURE;  Surgeon: Carole Civil, MD;  Location: [facility];  Service: Orthopedics;  Laterality: Right;  . SECONDARY CLOSURE OF WOUND  01/22/2012   Procedure: SECONDARY CLOSURE OF WOUND;  Surgeon: Jonnie Kind, MD;  [facility];  Service: Gynecology;  Laterality: N/A;  . WOUND EXPLORATION  01/03/2012   Necrotizing fascitis at C section incision  . WOUND EXPLORATION  01/05/2012   Procedure: WOUND EXPLORATION;  Surgeon: Jonnie Kind, MD;  [facility];  Service: Gynecology;  Laterality: N/A;  Re-Exploration of Wound Abscess with Debridement  . WOUND EXPLORATION  01/07/2012   Procedure: WOUND EXPLORATION;  Surgeon: Jonnie Kind, MD;  [facility];  Service: Gynecology;;  . WOUND EXPLORATION  01/11/2012   Procedure: WOUND EXPLORATION;  Surgeon: Jonnie Kind, MD;  [facility];  Service: Gynecology;  Laterality: N/A;  Debridment of Wound Abscess  . WOUND EXPLORATION  01/22/2012   Procedure: WOUND EXPLORATION;  Surgeon: Jonnie Kind, MD;  [facility];  Service: Gynecology;  Laterality: N/A;  Delayed Secondary Wound Closure  . WRIST SURGERY Left    Cyst removal   Medications: Reviewed & Updated - see associated section                       Current Outpatient Medications:  .  aspirin-acetaminophen-caffeine (EXCEDRIN MIGRAINE) 250-250-65 MG tablet, Take 1-2 tablets by mouth daily as needed for headache. , Disp: , Rfl:  .  naproxen (NAPROSYN) 500 MG tablet, Take 1 tablet (500 mg total) by mouth 2 (two) times daily with a meal., Disp: 60 tablet, Rfl: 2 .  SUMAtriptan (IMITREX) 50 MG tablet, Take 50 mg by mouth daily as needed for migraine. , Disp: , Rfl:    Social History: Reviewed -  reports that she has never smoked. She has never used smokeless tobacco.  Objective Findings:  Vitals: There were no vitals taken for this visit.  PHYSICAL EXAMINATION General appearance - alert, well appearing, and in no distress, oriented to person, place, and time and overweight Mental status - alert, oriented to person, place, and time, normal mood, behavior, speech, dress, motor activity, and thought processes, affect appropriate to mood  PELVIC Cervix - Normal in appearance. No active bleeding. No polyps. Uterus - Tiny, not tender, moves around well. The uterus does not feel attached to the anterior abdominal wall which is encouraging Assessment & Plan:   A:  1.  AUB s/p endometrial ablation, mild  P:  1.  Rx Megace 40 mg PO 3x daily until bleeding stops, then switch to once daily 2. F/U prn  By signing my name below, I, De Burrs, attest that this documentation has been prepared under the  direction and in the presence of Jonnie Kind, MD. Electronically Signed: De Burrs, Medical Scribe. 10/23/18. 12:03 PM.  I personally performed the services described in this documentation, which was SCRIBED in my presence. The recorded information has been reviewed and considered accurate. It has been edited as necessary during review. Jonnie Kind, MD

## 2018-10-29 ENCOUNTER — Ambulatory Visit: Payer: BC Managed Care – PPO | Admitting: Obstetrics and Gynecology

## 2019-04-21 ENCOUNTER — Ambulatory Visit: Payer: BC Managed Care – PPO | Attending: Internal Medicine

## 2019-04-21 ENCOUNTER — Ambulatory Visit: Payer: BC Managed Care – PPO | Admitting: Family Medicine

## 2019-04-21 DIAGNOSIS — Z23 Encounter for immunization: Secondary | ICD-10-CM | POA: Insufficient documentation

## 2019-04-21 NOTE — Progress Notes (Signed)
   Covid-19 Vaccination Clinic  Name:  [name]    MRN: [id number] DOB: 02-07-1976  04/21/2019  [name] was observed post Covid-19 immunization for 15 minutes without incident. She was provided with Vaccine Information Sheet and instruction to access the V-Safe system.   [name] was instructed to call 911 with any severe reactions post vaccine: Marland Kitchen Difficulty breathing  . Swelling of face and throat  . A fast heartbeat  . A bad rash all over body  . Dizziness and weakness   Immunizations Administered    Name Date Dose VIS Date Route   Moderna COVID-19 Vaccine 04/21/2019  3:15 PM 0.5 mL 01/20/2019 Intramuscular   Manufacturer: Moderna   Lot: [id number]   [id number]

## 2019-05-19 ENCOUNTER — Ambulatory Visit: Payer: BC Managed Care – PPO | Attending: Internal Medicine

## 2019-05-19 DIAGNOSIS — Z23 Encounter for immunization: Secondary | ICD-10-CM

## 2019-05-19 NOTE — Progress Notes (Signed)
   Covid-19 Vaccination Clinic  Name:  ANALY STADLER    MRN: [id number] DOB: 06-14-1975  05/19/2019  Ms. Knightly was observed post Covid-19 immunization for 15 minutes without incident. She was provided with Vaccine Information Sheet and instruction to access the V-Safe system.   Ms. Works was instructed to call 911 with any severe reactions post vaccine: Marland Kitchen Difficulty breathing  . Swelling of face and throat  . A fast heartbeat  . A bad rash all over body  . Dizziness and weakness   Immunizations Administered    Name Date Dose VIS Date Route   Moderna COVID-19 Vaccine 05/19/2019  3:20 PM 0.5 mL 01/20/2019 Intramuscular   Manufacturer: Moderna   Lot: [id number]   [id number]

## 2019-07-29 ENCOUNTER — Other Ambulatory Visit: Payer: Self-pay

## 2019-07-29 ENCOUNTER — Ambulatory Visit: Payer: BC Managed Care – PPO | Admitting: Nurse Practitioner

## 2019-07-29 ENCOUNTER — Encounter: Payer: Self-pay | Admitting: Nurse Practitioner

## 2019-07-29 VITALS — BP 118/80 | HR 72 | Temp 97.9°F | Resp 18 | Ht 62.0 in | Wt 195.8 lb

## 2019-07-29 DIAGNOSIS — Z13220 Encounter for screening for lipoid disorders: Secondary | ICD-10-CM | POA: Diagnosis not present

## 2019-07-29 DIAGNOSIS — Z13228 Encounter for screening for other metabolic disorders: Secondary | ICD-10-CM | POA: Diagnosis not present

## 2019-07-29 DIAGNOSIS — Z1159 Encounter for screening for other viral diseases: Secondary | ICD-10-CM

## 2019-07-29 DIAGNOSIS — Z8669 Personal history of other diseases of the nervous system and sense organs: Secondary | ICD-10-CM

## 2019-07-29 DIAGNOSIS — Z1329 Encounter for screening for other suspected endocrine disorder: Secondary | ICD-10-CM

## 2019-07-29 DIAGNOSIS — E559 Vitamin D deficiency, unspecified: Secondary | ICD-10-CM | POA: Diagnosis not present

## 2019-07-29 DIAGNOSIS — Z0001 Encounter for general adult medical examination with abnormal findings: Secondary | ICD-10-CM

## 2019-07-29 DIAGNOSIS — R232 Flushing: Secondary | ICD-10-CM

## 2019-07-29 DIAGNOSIS — R6889 Other general symptoms and signs: Secondary | ICD-10-CM

## 2019-07-29 LAB — PROLACTIN: Prolactin: 10.8 ng/mL

## 2019-07-29 LAB — FOLLICLE STIMULATING HORMONE: FSH: 7 m[IU]/mL

## 2019-07-29 NOTE — Patient Instructions (Addendum)
Drink plenty of water Follow a low-fat, low-cholesterol, low carbohydrate, low sugar diet 20 minutes of exercise at least 4 times a week Follow-up yearly for a general wellness examination Labs, today you will be called with abnormal findings and any directions Take Topiramate for migraine as directed May take Ibuprofen 600-800mg  with Pepcid 20mg  every 8 hour prn for migraine on full stomach.

## 2019-07-29 NOTE — Progress Notes (Addendum)
New Patient Office Visit  Subjective:  Patient ID: Kara Gentry, female    DOB: 20-Dec-1975  Age: 44 y.o. MRN: [id number]  CC:  Chief Complaint  Patient presents with  . Establish Care    HPI Kara Gentry is a 44 year old female presenting to establish care and for adult general exam. Flu completed, Covid completed, HIV screening, Tdap utd.  [facility] has had ablation does not have periods, PAP appr 2 years ago has GYN she has pap every 3 years, Hep C screening pt desires and will complete lab today, will also draw vitamin D with h/o and pt off of medication, she reports nighttime hotflashes would like labs to eval for pre menopause and thyroid also with family "aunt" h/o hypothyroidism.  No cp/ct, gu/gi sxs, pain, sob, edema, palpitation, or falls.   Pt reports h/o migraine headache ran out of her Triptene medication that she does not like to take as it makes her very drowsy. She take Excedrin migraine that helps some. After discussion she would like to try Topamax and alternative for breakthrough headache to Excedrin will try Ibuprofen with Pepcid for stomach protection.   Past Medical History:  Diagnosis Date  . Anemia   . Bursitis    Right shoulder  . Cancer ([place])    soft tissue of left cheeck  . Fibroids 05/31/2016  . Headache   . Hemorrhoids   . PONV (postoperative nausea and vomiting)   . Seasonal allergies   . Vitamin D deficiency 05/23/2016   Take 5000 IU vitamin D3 daily    Past Surgical History:  Procedure Laterality Date  . APPLICATION OF WOUND VAC  01/03/2012   Procedure: APPLICATION OF WOUND VAC;  Surgeon: Jonnie Kind, MD;  [facility]: [facility];  Service: Gynecology;  Laterality: N/A;  . APPLICATION OF WOUND VAC  01/05/2012   Procedure: APPLICATION OF WOUND VAC;  Surgeon: Jonnie Kind, MD;  [facility];  Service: Gynecology;  Laterality: N/A;  . APPLICATION OF WOUND VAC  01/07/2012   Procedure: APPLICATION OF WOUND VAC;  Surgeon: Jonnie Kind, MD;   [facility];  Service: Gynecology;;  . APPLICATION OF WOUND VAC  01/11/2012   Procedure: APPLICATION OF WOUND VAC;  Surgeon: Jonnie Kind, MD;  [facility];  Service: Gynecology;  Laterality: N/A;  Fasicial Closure  . CESAREAN SECTION  12/22/2011   Procedure: CESAREAN SECTION;  Surgeon: Donnamae Jude, MD;  Location: [facility];  Service: Obstetrics;  Laterality: N/A;  Primary cesarean section with delivery of Baby A girl at 39. Baby B girl at 35.  Marland Kitchen CESAREAN SECTION N/A    Phreesia 07/26/2019  . DILITATION & CURRETTAGE/HYSTROSCOPY WITH NOVASURE ABLATION N/A 09/04/2016   Procedure: HYSTEROSCOPY WITH NOVASURE ABLATION;  Surgeon: Jonnie Kind, MD;  [facility];  Service: Gynecology;  Laterality: N/A;  . ECTOPIC PREGNANCY SURGERY    . FRACTURE SURGERY N/A    Phreesia 07/26/2019  . INCISION AND DRAINAGE OF WOUND  01/07/2012   Procedure: IRRIGATION AND DEBRIDEMENT WOUND;  Surgeon: Jonnie Kind, MD;  [facility]: [facility];  Service: Gynecology;;  . INSERTION OF MESH  01/03/2012   Procedure: INSERTION OF MESH;  Surgeon: Jonnie Kind, MD;  [facility];  Service: Gynecology;  Laterality: N/A;  . MANDIBLE SURGERY Right cancer   Cancerous mass  . NASAL SINUS SURGERY    . ORIF ANKLE FRACTURE Right 05/09/2017   Procedure: OPEN REDUCTION INTERNAL FIXATION (ORIF) ANKLE  FRACTURE;  Surgeon: Carole Civil, MD;  Location: [facility];  Service: Orthopedics;  Laterality: Right;  . SECONDARY CLOSURE OF WOUND  01/22/2012   Procedure: SECONDARY CLOSURE OF WOUND;  Surgeon: Jonnie Kind, MD;  [facility];  Service: Gynecology;  Laterality: N/A;  . WOUND EXPLORATION  01/03/2012   Necrotizing fascitis at C section incision  . WOUND EXPLORATION  01/05/2012   Procedure: WOUND EXPLORATION;  Surgeon: Jonnie Kind, MD;  [facility];  Service: Gynecology;  Laterality: N/A;  Re-Exploration of Wound Abscess with Debridement  . WOUND EXPLORATION  01/07/2012   Procedure: WOUND  EXPLORATION;  Surgeon: Jonnie Kind, MD;  [facility];  Service: Gynecology;;  . WOUND EXPLORATION  01/11/2012   Procedure: WOUND EXPLORATION;  Surgeon: Jonnie Kind, MD;  [facility];  Service: Gynecology;  Laterality: N/A;  Debridment of Wound Abscess  . WOUND EXPLORATION  01/22/2012   Procedure: WOUND EXPLORATION;  Surgeon: Jonnie Kind, MD;  [facility]: [facility];  Service: Gynecology;  Laterality: N/A;  Delayed Secondary Wound Closure  . WRIST SURGERY Left    Cyst removal    Family History  Problem Relation Age of Onset  . Cancer Maternal Grandfather        prostate  . Diabetes Mother   . Arthritis Mother   . Cancer Paternal Uncle   . Cancer Maternal Uncle        lung  . Diabetes Maternal Aunt   . Arthritis Maternal Aunt     Social History   Socioeconomic History  . Marital status: Married    Spouse name: Not on file  . Number of children: Not on file  . Years of education: masters  . Highest education level: Not on file  Occupational History  . Occupation: Product manager: [employer]  Tobacco Use  . Smoking status: Never Smoker  . Smokeless tobacco: Never Used  Vaping Use  . Vaping Use: Never used  Substance and Sexual Activity  . Alcohol use: No  . Drug use: No  . Sexual activity: Yes    Birth control/protection: Surgical  Other Topics Concern  . Not on file  Social History Narrative  . Not on file   Social Determinants of Health   Financial Resource Strain:   . Difficulty of Paying Living Expenses:   Food Insecurity:   . Worried About Charity fundraiser in the Last Year:   . Arboriculturist in the Last Year:   Transportation Needs:   . Film/video editor (Medical):   Marland Kitchen Lack of Transportation (Non-Medical):   Physical Activity:   . Days of Exercise per Week:   . Minutes of Exercise per Session:   Stress:   . Feeling of Stress :   Social Connections:   . Frequency of Communication with Friends and Family:   .  Frequency of Social Gatherings with Friends and Family:   . Attends Religious Services:   . Active Member of Clubs or Organizations:   . Attends Archivist Meetings:   Marland Kitchen Marital Status:   Intimate Partner Violence:   . Fear of Current or Ex-Partner:   . Emotionally Abused:   Marland Kitchen Physically Abused:   . Sexually Abused:     ROS Review of Systems  All other systems reviewed and are negative.   Objective:   Today's Vitals: BP 118/80 (BP Location: Left Arm, Patient Position: Sitting, Cuff Size: Normal)  Pulse 72   Temp 97.9 F (36.6 C) (Temporal)   Resp 18   Ht 5\' 2"  (1.575 m)   Wt 195 lb 12.8 oz (88.8 kg)   SpO2 98%   BMI 35.81 kg/m   Physical Exam Vitals and nursing note reviewed.  Constitutional:      Appearance: Normal appearance. She is well-developed and well-groomed.  HENT:     Head: Normocephalic.     Right Ear: Hearing and external ear normal.     Left Ear: Hearing and external ear normal.     Nose: Nose normal.     Mouth/Throat:     Lips: Pink.     Mouth: Mucous membranes are moist.     Pharynx: Oropharynx is clear.  Eyes:     General: Lids are normal. Lids are everted, no foreign bodies appreciated.     Extraocular Movements: Extraocular movements intact.     Conjunctiva/sclera: Conjunctivae normal.     Pupils: Pupils are equal, round, and reactive to light.  Neck:     Thyroid: No thyromegaly.     Vascular: No carotid bruit or JVD.  Cardiovascular:     Rate and Rhythm: Normal rate and regular rhythm.     Pulses: Normal pulses.     Heart sounds: Normal heart sounds, S1 normal and S2 normal.  Pulmonary:     Effort: Pulmonary effort is normal.     Breath sounds: Normal breath sounds.  Chest:     Chest wall: No deformity.  Abdominal:     General: Abdomen is flat. Bowel sounds are normal. There is no abdominal bruit.     Palpations: There is no hepatomegaly or splenomegaly.     Tenderness: There is no abdominal tenderness. There is no rebound.    Musculoskeletal:        General: Normal range of motion.     Cervical back: Full passive range of motion without pain, normal range of motion and neck supple.     Right lower leg: No edema.     Left lower leg: No edema.  Lymphadenopathy:     Head:     Right side of head: No submental, submandibular, tonsillar or occipital adenopathy.     Left side of head: No submental, submandibular, tonsillar or occipital adenopathy.     Cervical: No cervical adenopathy.  Skin:    General: Skin is warm.     Capillary Refill: Capillary refill takes less than 2 seconds.     Coloration: Skin is not jaundiced.  Neurological:     General: No focal deficit present.     Mental Status: She is alert and oriented to person, place, and time.     Cranial Nerves: Cranial nerves are intact.  Psychiatric:        Attention and Perception: Attention normal.        Mood and Affect: Mood normal.        Speech: Speech normal.        Behavior: Behavior normal. Behavior is cooperative.        Thought Content: Thought content normal.        Cognition and Memory: Cognition normal.        Judgment: Judgment normal.     Assessment & Plan:   Problem List Items Addressed This Visit      Other   Vitamin D deficiency - Primary   Relevant Orders   VITAMIN D 25 Hydroxy (Vit-D Deficiency, Fractures) (Completed)   Hx of migraines  Relevant Medications   topiramate (TOPAMAX) 25 MG tablet    Other Visit Diagnoses    Screening, lipid       Relevant Orders   Lipid panel (Completed)   Screening for metabolic disorder       Relevant Orders   CBC with Differential/Platelet (Completed)   COMPLETE METABOLIC PANEL WITH GFR (Completed)   Heat intolerance       Relevant Orders   Prolactin (Completed)   Follicle stimulating hormone (Completed)   Thyroid disorder screen       Relevant Orders   T4, free (Completed)   TSH (Completed)   T3, Free (Completed)   Encounter for hepatitis C screening test for low risk patient        Relevant Orders   Hepatitis panel, acute   Hot flashes       Relevant Orders   Prolactin (Completed)   Follicle stimulating hormone (Completed)     Drink plenty of water Follow a low-fat, low-cholesterol, low carbohydrate, low sugar diet 20 minutes of exercise at least 4 times a week Follow-up yearly for a general wellness examination Labs, today you will be called with abnormal findings and any directions Take Topiramate for migraine as directed May take Ibuprofen 600-800mg  with Pepcid 20mg  every 8 hour prn for migraine on full stomach.   Outpatient Encounter Medications as of 07/29/2019  Medication Sig  . aspirin-acetaminophen-caffeine (EXCEDRIN MIGRAINE) 250-250-65 MG tablet Take 1-2 tablets by mouth daily as needed for headache.   . SUMAtriptan (IMITREX) 50 MG tablet Take 50 mg by mouth daily as needed for migraine.   . topiramate (TOPAMAX) 25 MG tablet Take 1/2 tablet daily for one week Take 1 tablet daily orally starting week two.  . [DISCONTINUED] megestrol (MEGACE) 40 MG tablet Take 1 tablet (40 mg total) by mouth 3 (three) times daily. Until bleeding controlled then once daily  . [DISCONTINUED] naproxen (NAPROSYN) 500 MG tablet Take 1 tablet (500 mg total) by mouth 2 (two) times daily with a meal.   No facility-administered encounter medications on file as of 07/29/2019.    Follow-up: Return in about 1 year (around 07/28/2020) for Labs 1 week prior.   Annie Main, FNP

## 2019-07-30 ENCOUNTER — Other Ambulatory Visit: Payer: Self-pay | Admitting: Nurse Practitioner

## 2019-07-30 DIAGNOSIS — Z8669 Personal history of other diseases of the nervous system and sense organs: Secondary | ICD-10-CM | POA: Insufficient documentation

## 2019-07-30 LAB — HEPATITIS PANEL, ACUTE
Hep A IgM: NONREACTIVE
Hep B C IgM: NONREACTIVE
Hepatitis B Surface Ag: NONREACTIVE
Hepatitis C Ab: NONREACTIVE
SIGNAL TO CUT-OFF: 0.01 (ref <1.00)

## 2019-07-30 LAB — COMPLETE METABOLIC PANEL WITH GFR
AG Ratio: 1.5 (calc) (ref 1.0–2.5)
ALT: 13 U/L (ref 6–29)
AST: 14 U/L (ref 10–30)
Albumin: 4.3 g/dL (ref 3.6–5.1)
Alkaline phosphatase (APISO): 43 U/L (ref 31–125)
BUN: 20 mg/dL (ref 7–25)
CO2: 25 mmol/L (ref 20–32)
Calcium: 9.4 mg/dL (ref 8.6–10.2)
Chloride: 102 mmol/L (ref 98–110)
Creat: 0.81 mg/dL (ref 0.50–1.10)
GFR, Est African American: 102 mL/min/{1.73_m2} (ref >=60)
GFR, Est Non African American: 88 mL/min/{1.73_m2} (ref >=60)
Globulin: 2.8 g/dL (calc) (ref 1.9–3.7)
Glucose, Bld: 90 mg/dL (ref 65–99)
Potassium: 4 mmol/L (ref 3.5–5.3)
Sodium: 137 mmol/L (ref 135–146)
Total Bilirubin: 0.6 mg/dL (ref 0.2–1.2)
Total Protein: 7.1 g/dL (ref 6.1–8.1)

## 2019-07-30 LAB — CBC WITH DIFFERENTIAL/PLATELET
Absolute Monocytes: 402 cells/uL (ref 200–950)
Basophils Absolute: 29 cells/uL (ref 0–200)
Basophils Relative: 0.4 %
Eosinophils Absolute: 80 cells/uL (ref 15–500)
Eosinophils Relative: 1.1 %
HCT: 39.1 % (ref 35.0–45.0)
Hemoglobin: 12.6 g/dL (ref 11.7–15.5)
Lymphs Abs: 2562 cells/uL (ref 850–3900)
MCH: 27.4 pg (ref 27.0–33.0)
MCHC: 32.2 g/dL (ref 32.0–36.0)
MCV: 85 fL (ref 80.0–100.0)
MPV: 10.4 fL (ref 7.5–12.5)
Monocytes Relative: 5.5 %
Neutro Abs: 4227 cells/uL (ref 1500–7800)
Neutrophils Relative %: 57.9 %
Platelets: 298 10*3/uL (ref 140–400)
RBC: 4.6 10*6/uL (ref 3.80–5.10)
RDW: 12.8 % (ref 11.0–15.0)
Total Lymphocyte: 35.1 %
WBC: 7.3 10*3/uL (ref 3.8–10.8)

## 2019-07-30 LAB — LIPID PANEL
Cholesterol: 178 mg/dL (ref <200)
HDL: 62 mg/dL (ref >=50)
LDL Cholesterol (Calc): 101 mg/dL (calc) — ABNORMAL HIGH
Non-HDL Cholesterol (Calc): 116 mg/dL (calc) (ref <130)
Total CHOL/HDL Ratio: 2.9 (calc) (ref <5.0)
Triglycerides: 68 mg/dL (ref <150)

## 2019-07-30 LAB — T3, FREE: T3, Free: 3.1 pg/mL (ref 2.3–4.2)

## 2019-07-30 LAB — TSH: TSH: 1.06 mIU/L

## 2019-07-30 LAB — VITAMIN D 25 HYDROXY (VIT D DEFICIENCY, FRACTURES): Vit D, 25-Hydroxy: 27 ng/mL — ABNORMAL LOW (ref 30–100)

## 2019-07-30 LAB — T4, FREE: Free T4: 1.3 ng/dL (ref 0.8–1.8)

## 2019-07-30 MED ORDER — TOPIRAMATE 25 MG PO TABS: ORAL_TABLET | ORAL | 0 refills | Status: DC

## 2019-07-30 NOTE — Addendum Note (Signed)
Addended by: Ishmael Holter on: 07/30/2019 10:39 AM   Modules accepted: Orders

## 2019-07-30 NOTE — Progress Notes (Signed)
1. Vitamin d is a bit low should take 1000 iu daily orally 2. Thyroid is normal tsh on lower end of normal will check again in 6 months 3. Not in menopause 4. Could lower her bad cholesterol but over was good numbers 5. Hepc negative

## 2019-09-25 ENCOUNTER — Other Ambulatory Visit (HOSPITAL_COMMUNITY): Payer: Self-pay | Admitting: Obstetrics and Gynecology

## 2019-09-25 DIAGNOSIS — Z1231 Encounter for screening mammogram for malignant neoplasm of breast: Secondary | ICD-10-CM

## 2019-10-05 ENCOUNTER — Ambulatory Visit (HOSPITAL_COMMUNITY)
Admission: RE | Admit: 2019-10-05 | Discharge: 2019-10-05 | Disposition: A | Discharge status: Home / Self Care | Payer: BC Managed Care – PPO | Source: Ambulatory Visit | Attending: Obstetrics and Gynecology | Admitting: Obstetrics and Gynecology

## 2019-10-05 ENCOUNTER — Other Ambulatory Visit: Payer: Self-pay

## 2019-10-05 DIAGNOSIS — Z1231 Encounter for screening mammogram for malignant neoplasm of breast: Secondary | ICD-10-CM | POA: Diagnosis present

## 2019-10-13 NOTE — Progress Notes (Signed)
Normal mammogram, will recheck in a year.

## 2019-11-12 ENCOUNTER — Encounter: Payer: Self-pay | Admitting: Obstetrics and Gynecology

## 2019-11-12 ENCOUNTER — Ambulatory Visit: Payer: BC Managed Care – PPO | Admitting: Obstetrics and Gynecology

## 2019-11-12 VITALS — Ht 62.0 in | Wt 179.6 lb

## 2019-11-12 DIAGNOSIS — N939 Abnormal uterine and vaginal bleeding, unspecified: Secondary | ICD-10-CM

## 2019-11-12 NOTE — Progress Notes (Signed)
[facility] Visit  @DATE @            Patient name: Kara Gentry MRN [id number]  Date of birth: [birth year]  CC & HPI:  Kara Gentry is a 44 y.o. female presenting today for followup of light red monthly menstrual bleeding. 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 For twins at 23 wk with pprom   ROS:  ROS   Pertinent History Reviewed:   Reviewed: Significant for  Medical         Past Medical History:  Diagnosis Date  . Anemia   . Bursitis    Right shoulder  . Cancer ([place])    soft tissue of left cheeck  . Fibroids 05/31/2016  . Headache   . Hemorrhoids   . PONV (postoperative nausea and vomiting)   . Seasonal allergies   . Vitamin D deficiency 05/23/2016   Take 5000 IU vitamin D3 daily                              Surgical Hx:    Past Surgical History:  Procedure Laterality Date  . APPLICATION OF WOUND VAC  01/03/2012   Procedure: APPLICATION OF WOUND VAC;  Surgeon: Jonnie Kind, MD;  Location: [facility];  Service: Gynecology;  Laterality: N/A;  . APPLICATION OF WOUND VAC  01/05/2012   Procedure: APPLICATION OF WOUND VAC;  Surgeon: Jonnie Kind, MD;  Location: [facility];  Service: Gynecology;  Laterality: N/A;  . APPLICATION OF WOUND VAC  01/07/2012   Procedure: APPLICATION OF WOUND VAC;  Surgeon: Jonnie Kind, MD;  [facility]: [facility];  Service: Gynecology;;  . APPLICATION OF WOUND VAC  01/11/2012   Procedure: APPLICATION OF WOUND VAC;  Surgeon: Jonnie Kind, MD;  Location: [facility];  Service: Gynecology;  Laterality: N/A;  Fasicial Closure  . CESAREAN SECTION  12/22/2011   Procedure: CESAREAN SECTION;  Surgeon: Donnamae Jude, MD;  Location: [facility];  Service: Obstetrics;  Laterality: N/A;  Primary cesarean section with delivery of Baby A girl at 20. Baby B girl at 7.  Marland Kitchen CESAREAN SECTION N/A    Phreesia 07/26/2019  . DILITATION & CURRETTAGE/HYSTROSCOPY WITH  NOVASURE ABLATION N/A 09/04/2016   Procedure: HYSTEROSCOPY WITH NOVASURE ABLATION;  Surgeon: Jonnie Kind, MD;  Location: [facility];  Service: Gynecology;  Laterality: N/A;  . ECTOPIC PREGNANCY SURGERY    . FRACTURE SURGERY N/A    Phreesia 07/26/2019  . INCISION AND DRAINAGE OF WOUND  01/07/2012   Procedure: IRRIGATION AND DEBRIDEMENT WOUND;  Surgeon: Jonnie Kind, MD;  [facility]: [facility];  Service: Gynecology;;  . INSERTION OF MESH  01/03/2012   Procedure: INSERTION OF MESH;  Surgeon: Jonnie Kind, MD;  [facility]: [facility];  Service: Gynecology;  Laterality: N/A;  . MANDIBLE SURGERY Right cancer   Cancerous mass  . NASAL SINUS SURGERY    . ORIF ANKLE FRACTURE Right 05/09/2017   Procedure: OPEN REDUCTION INTERNAL FIXATION (ORIF) ANKLE FRACTURE;  Surgeon: Carole Civil, MD;  Location: [facility];  Service: Orthopedics;  Laterality: Right;  . SECONDARY CLOSURE OF WOUND  01/22/2012   Procedure: SECONDARY CLOSURE OF WOUND;  Surgeon: Jonnie Kind, MD;  [facility]: [facility];  Service: Gynecology;  Laterality: N/A;  . WOUND EXPLORATION  01/03/2012   Necrotizing fascitis at C  section incision  . WOUND EXPLORATION  01/05/2012   Procedure: WOUND EXPLORATION;  Surgeon: Jonnie Kind, MD;  [facility];  Service: Gynecology;  Laterality: N/A;  Re-Exploration of Wound Abscess with Debridement  . WOUND EXPLORATION  01/07/2012   Procedure: WOUND EXPLORATION;  Surgeon: Jonnie Kind, MD;  [facility];  Service: Gynecology;;  . WOUND EXPLORATION  01/11/2012   Procedure: WOUND EXPLORATION;  Surgeon: Jonnie Kind, MD;  [facility];  Service: Gynecology;  Laterality: N/A;  Debridment of Wound Abscess  . WOUND EXPLORATION  01/22/2012   Procedure: WOUND EXPLORATION;  Surgeon: Jonnie Kind, MD;  [facility]: [facility];  Service: Gynecology;  Laterality: N/A;  Delayed Secondary Wound Closure  . WRIST SURGERY Left    Cyst removal   Medications: Reviewed & Updated - see associated section                        Current Outpatient Medications:  .  aspirin-acetaminophen-caffeine (EXCEDRIN MIGRAINE) 250-250-65 MG tablet, Take 1-2 tablets by mouth daily as needed for headache. , Disp: , Rfl:  .  topiramate (TOPAMAX) 25 MG tablet, Take 1/2 tablet daily for one week Take 1 tablet daily orally starting week two., Disp: 30 tablet, Rfl: 0   Social History: Reviewed -  reports that she has never smoked. She has never used smokeless tobacco.  Objective Findings:  Vitals: Height 5\' 2"  (1.575 m), weight 179 lb 9.6 oz (81.5 kg).  PHYSICAL EXAMINATION General appearance - alert, well appearing, and in no distress and normal appearing weight Mental status - alert, oriented to person, place, and time Chest - clear to auscultation, no wheezes, rales or rhonchi, symmetric air entry Heart - normal rate and regular rhythm Abdomen - soft, nontender, nondistended, no masses or organomegaly Breasts - breasts appear normal, no suspicious masses, no skin or nipple changes or axillary nodes Skin - normal coloration and turgor, no rashes, no suspicious skin lesions noted  PELVIC External genitalia - normal  Vulva - nl Vagina - no blood Cervix - visually normal  Uterus - mobile  Adnexa - nontender [place] - na Rectal - normal rectal, no masses, guaiac negative stool obtained    Assessment & Plan:   A:  1.  vaginal bleeding 3 yr s/p endometrial ablation  P:  1.  reassure normal anatomy found 2. Obtain pelvic u/s 1 wk aph 3.

## 2019-11-13 ENCOUNTER — Encounter: Payer: Self-pay | Admitting: *Deleted

## 2019-11-17 ENCOUNTER — Ambulatory Visit (HOSPITAL_COMMUNITY): Admission: RE | Admit: 2019-11-17 | Payer: BC Managed Care – PPO | Source: Ambulatory Visit

## 2019-12-02 ENCOUNTER — Other Ambulatory Visit: Payer: Self-pay

## 2019-12-02 ENCOUNTER — Encounter: Payer: Self-pay | Admitting: Family Medicine

## 2019-12-02 ENCOUNTER — Ambulatory Visit: Payer: BC Managed Care – PPO | Admitting: Family Medicine

## 2019-12-02 VITALS — BP 128/74 | HR 77 | Temp 98.2°F | Resp 12 | Ht 62.0 in | Wt 180.0 lb

## 2019-12-02 DIAGNOSIS — R102 Pelvic and perineal pain: Secondary | ICD-10-CM

## 2019-12-02 DIAGNOSIS — Z23 Encounter for immunization: Secondary | ICD-10-CM

## 2019-12-02 DIAGNOSIS — N938 Other specified abnormal uterine and vaginal bleeding: Secondary | ICD-10-CM | POA: Diagnosis not present

## 2019-12-02 NOTE — Progress Notes (Signed)
   Subjective:    Patient ID: Kara Gentry, female    DOB: 29-Nov-1975, 44 y.o.   MRN: [id number]  Patient presents for Gynecologic Exam (Pt said that she had an ablation done several years ago by a dr in [place] who has since retired. She said that her cycle has since started back. She said that it is every month and a light pad works fine but it is getting painful. She said that she had been scheduled for an ultrasound but missed that and it was a while back. She said that the color has gotten lighter and brighter which they had told her would be a concern and the cramps are really bothering her. )   Pt here with DUB/PELVIC PAIN this was being worked up by [employer]. She has long standing history of pelvic issues. In 2013 she had a twin gestation complicated by PPROM. She had post operative necrotizing fascitis that required prolonged treatment.  Since then she has had issue with pelvic pain and bleeding. She had Hysteroscopy in 2018 with Novasure ablation and that worked for quite some time She then started having bleeding issues again The past few months :  She doesn't have very heavy cycles, but there has been a change in the bleeding initially had dark brown color, now a bright red blood with cramping   She was scheduled for pelvic US but missed the appt, now her GYN has retired She was not sure who she was supposed to f/u with     No history of anemia  Takes topamax prn migraines   Hx of Mucoepidermoid carcinoma  - jaw 2013     Review Of Systems:  GEN- denies fatigue, fever, weight loss,weakness, recent illness HEENT- denies eye drainage, change in vision, nasal discharge, CVS- denies chest pain, palpitations RESP- denies SOB, cough, wheeze ABD- denies N/V, change in stools, abd pain GU- denies dysuria, hematuria, dribbling, incontinence MSK- denies joint pain, muscle aches, injury Neuro- denies headache, dizziness, syncope, seizure activity       Objective:    BP 128/74    Pulse 77   Temp 98.2 F (36.8 C)   Resp 12   Ht 5\' 2"  (1.575 m)   Wt 180 lb (81.6 kg)   LMP 11/29/2019   SpO2 98%   BMI 32.92 kg/m  GEN- NAD, alert and oriented x3 HEENT- PERRL, EOMI, non injected sclera, pink conjunctiva, MMM, oropharynx clear Neck- Supple, no thyromegaly CVS- RRR, no murmur RESP-CTAB ABD-NABS,soft,NT,ND EXT- No edema Pulses- Radial  2+        Assessment & Plan:      Problem List Items Addressed This Visit      Unprioritized   Pelvic pain    Other Visit Diagnoses    Need for immunization against influenza    -  Primary   Relevant Orders   Flu Vaccine QUAD 36+ mos IM (Completed)   DUB (dysfunctional uterine bleeding)       Pelvic US rescheduled to next Wed, Will contact her current GYN office to see if space available or if she needs a new GYN. Defer treatment to GYN, pt voiced understanding       Note: This dictation was prepared with Dragon dictation along with smaller phrase technology. Any transcriptional errors that result from this process are unintentional.

## 2019-12-02 NOTE — Patient Instructions (Signed)
Wed 10/20 at 2:15pm Full bladder I will get appt with Dr. Elonda Husky F/U as needed

## 2019-12-09 ENCOUNTER — Ambulatory Visit (HOSPITAL_COMMUNITY): Payer: BC Managed Care – PPO

## 2019-12-11 ENCOUNTER — Other Ambulatory Visit: Payer: Self-pay

## 2019-12-11 ENCOUNTER — Ambulatory Visit (HOSPITAL_COMMUNITY)
Admission: RE | Admit: 2019-12-11 | Discharge: 2019-12-11 | Disposition: A | Discharge status: Home / Self Care | Payer: BC Managed Care – PPO | Source: Ambulatory Visit | Attending: Obstetrics and Gynecology | Admitting: Obstetrics and Gynecology

## 2019-12-11 DIAGNOSIS — N939 Abnormal uterine and vaginal bleeding, unspecified: Secondary | ICD-10-CM | POA: Diagnosis not present

## 2019-12-17 ENCOUNTER — Encounter: Payer: Self-pay | Admitting: Obstetrics & Gynecology

## 2019-12-17 ENCOUNTER — Ambulatory Visit (INDEPENDENT_AMBULATORY_CARE_PROVIDER_SITE_OTHER): Payer: BC Managed Care – PPO | Admitting: Obstetrics & Gynecology

## 2019-12-17 VITALS — BP 111/69 | HR 74 | Ht 62.0 in | Wt 181.5 lb

## 2019-12-17 DIAGNOSIS — N946 Dysmenorrhea, unspecified: Secondary | ICD-10-CM

## 2019-12-17 DIAGNOSIS — D250 Submucous leiomyoma of uterus: Secondary | ICD-10-CM | POA: Diagnosis not present

## 2019-12-17 MED ORDER — MEGESTROL ACETATE 40 MG PO TABS: ORAL_TABLET | ORAL | 3 refills | Status: DC

## 2019-12-17 MED ORDER — NAPROXEN SODIUM 550 MG PO TABS: 550.0000 mg | ORAL_TABLET | Freq: Two times a day (BID) | ORAL | 1 refills | Status: DC

## 2019-12-17 NOTE — Progress Notes (Signed)
Follow up appointment for results  Chief Complaint  Patient presents with  . discuss having an ablation    Blood pressure 111/69, pulse 74, height 5\' 2"  (1.575 m), weight 181 lb 8 oz (82.3 kg), last menstrual period 11/29/2019.    Study Result  Narrative & Impression  CLINICAL DATA:  Bleeding, prior endometrial ablation in 2018  EXAM: TRANSABDOMINAL ULTRASOUND OF PELVIS  TECHNIQUE: Transabdominal ultrasound examination of the pelvis was performed including evaluation of the uterus, ovaries, adnexal regions, and pelvic cul-de-sac. Transvaginal imaging was not performed.  COMPARISON:  08/08/2016  FINDINGS: Uterus  Measurements: 10.6 x 4.4 x 8.9 cm = volume: 217 mL. Anteverted. Heterogeneous myometrium. Exophytic mass identified the uterine fundus 3.2 x 3.3 x 4.3 cm consistent with leiomyoma. Additional leiomyomata are seen at posterior mid uterus submucosal 4.1 cm greatest diameter and subserosal at anterior uterus greatest 1.8 cm.  Endometrium  Thickness: 6 mm.  No endometrial fluid or focal abnormality  Right ovary  Measurements: 2.8 x 1.3 x 1.9 cm = volume: 3.5 mL. Normal morphology without mass  Left ovary  Measurements: 2.4 x 1.1 x 2.1 cm = volume: 3.0 mL. Normal morphology without mass  Other findings: No free pelvic fluid. No adnexal masses. Visualized urinary bladder unremarkable.  IMPRESSION: Multiple uterine leiomyomata as above.  Otherwise negative exam.   Electronically Signed   By: Lavonia Dana M.D.   On: 12/11/2019 17:11     MEDS ordered this encounter: Meds ordered this encounter  Medications  . megestrol (MEGACE) 40 MG tablet    Sig: 1 tablet daily    Dispense:  30 tablet    Refill:  3  . naproxen sodium (ANAPROX DS) 550 MG tablet    Sig: Take 1 tablet (550 mg total) by mouth 2 (two) times daily with a meal.    Dispense:  60 tablet    Refill:  1    Orders for this encounter: No orders of the defined types were  placed in this encounter.   Impression:   ICD-10-CM   1. Dysmenorrhea  N94.6    Normal flow post ablation 2018  2. Submucous uterine fibroid  D25.0      Plan: Megestrol therapy daily for 3 weeks, off 1 week With hisotry of abdominal wall abscess,NEC want to avoid surgery if possible  Follow Up: Return in about 3 months (around 03/18/2020) for Follow up, with Dr Elonda Husky.       Face to face time:  20 minutes  Greater than 50% of the visit time was spent in counseling and coordination of care with the patient.  The summary and outline of the counseling and care coordination is summarized in the note above.   All questions were answered.  Past Medical History:  Diagnosis Date  . Anemia   . Bursitis    Right shoulder  . Cancer ([place])    soft tissue of left cheeck  . Fibroids 05/31/2016  . Headache   . Hemorrhoids   . PONV (postoperative nausea and vomiting)   . Seasonal allergies   . Vitamin D deficiency 05/23/2016   Take 5000 IU vitamin D3 daily    Past Surgical History:  Procedure Laterality Date  . APPLICATION OF WOUND VAC  01/03/2012   Procedure: APPLICATION OF WOUND VAC;  Surgeon: Jonnie Kind, MD;  [facility]: [facility];  Service: Gynecology;  Laterality: N/A;  . APPLICATION OF WOUND VAC  01/05/2012   Procedure: APPLICATION OF WOUND VAC;  Surgeon: Jonnie Kind,  MD;  [facility]: [facility];  Service: Gynecology;  Laterality: N/A;  . APPLICATION OF WOUND VAC  01/07/2012   Procedure: APPLICATION OF WOUND VAC;  Surgeon: Jonnie Kind, MD;  Location: [facility];  Service: Gynecology;;  . APPLICATION OF WOUND VAC  01/11/2012   Procedure: APPLICATION OF WOUND VAC;  Surgeon: Jonnie Kind, MD;  [facility];  Service: Gynecology;  Laterality: N/A;  Fasicial Closure  . CESAREAN SECTION  12/22/2011   Procedure: CESAREAN SECTION;  Surgeon: Donnamae Jude, MD;  Location: [facility];  Service: Obstetrics;  Laterality: N/A;  Primary cesarean section with delivery of Baby A girl at 4.  Baby B girl at 31.  Marland Kitchen CESAREAN SECTION N/A    Phreesia 07/26/2019  . DILITATION & CURRETTAGE/HYSTROSCOPY WITH NOVASURE ABLATION N/A 09/04/2016   Procedure: HYSTEROSCOPY WITH NOVASURE ABLATION;  Surgeon: Jonnie Kind, MD;  [facility]: [facility];  Service: Gynecology;  Laterality: N/A;  . ECTOPIC PREGNANCY SURGERY    . FRACTURE SURGERY N/A    Phreesia 07/26/2019  . INCISION AND DRAINAGE OF WOUND  01/07/2012   Procedure: IRRIGATION AND DEBRIDEMENT WOUND;  Surgeon: Jonnie Kind, MD;  [facility]: [facility];  Service: Gynecology;;  . INSERTION OF MESH  01/03/2012   Procedure: INSERTION OF MESH;  Surgeon: Jonnie Kind, MD;  [facility];  Service: Gynecology;  Laterality: N/A;  . MANDIBLE SURGERY Right cancer   Cancerous mass  . NASAL SINUS SURGERY    . ORIF ANKLE FRACTURE Right 05/09/2017   Procedure: OPEN REDUCTION INTERNAL FIXATION (ORIF) ANKLE FRACTURE;  Surgeon: Carole Civil, MD;  Location: [facility];  Service: Orthopedics;  Laterality: Right;  . SECONDARY CLOSURE OF WOUND  01/22/2012   Procedure: SECONDARY CLOSURE OF WOUND;  Surgeon: Jonnie Kind, MD;  [facility];  Service: Gynecology;  Laterality: N/A;  . WOUND EXPLORATION  01/03/2012   Necrotizing fascitis at C section incision  . WOUND EXPLORATION  01/05/2012   Procedure: WOUND EXPLORATION;  Surgeon: Jonnie Kind, MD;  [facility];  Service: Gynecology;  Laterality: N/A;  Re-Exploration of Wound Abscess with Debridement  . WOUND EXPLORATION  01/07/2012   Procedure: WOUND EXPLORATION;  Surgeon: Jonnie Kind, MD;  [facility];  Service: Gynecology;;  . WOUND EXPLORATION  01/11/2012   Procedure: WOUND EXPLORATION;  Surgeon: Jonnie Kind, MD;  [facility];  Service: Gynecology;  Laterality: N/A;  Debridment of Wound Abscess  . WOUND EXPLORATION  01/22/2012   Procedure: WOUND EXPLORATION;  Surgeon: Jonnie Kind, MD;  [facility];  Service: Gynecology;  Laterality: N/A;  Delayed Secondary Wound  Closure  . WRIST SURGERY Left    Cyst removal    OB History    Gravida  3   Para  1   Term  0   Preterm  1   AB  2   Living  0     SAB  2   TAB      Ectopic      Multiple  1   Live Births  2           No Known Allergies  Social History   Socioeconomic History  . Marital status: Married    Spouse name: Not on file  . Number of children: Not on file  . Years of education: masters  . Highest education level: Not on file  Occupational History  . Occupation: Product manager: [employer]  Tobacco Use  . Smoking status: Never Smoker  . Smokeless tobacco: Never Used  Vaping Use  . Vaping Use: Never used  Substance and Sexual Activity  . Alcohol use: No  . Drug use: No  . Sexual activity: Yes    Birth control/protection: Surgical    Comment: ablation  Other Topics Concern  . Not on file  Social History Narrative  . Not on file   Social Determinants of Health   Financial Resource Strain:   . Difficulty of Paying Living Expenses: Not on file  Food Insecurity:   . Worried About Charity fundraiser in the Last Year: Not on file  . Ran Out of Food in the Last Year: Not on file  Transportation Needs:   . Lack of Transportation (Medical): Not on file  . Lack of Transportation (Non-Medical): Not on file  Physical Activity:   . Days of Exercise per Week: Not on file  . Minutes of Exercise per Session: Not on file  Stress:   . Feeling of Stress : Not on file  Social Connections:   . Frequency of Communication with Friends and Family: Not on file  . Frequency of Social Gatherings with Friends and Family: Not on file  . Attends Religious Services: Not on file  . Active Member of Clubs or Organizations: Not on file  . Attends Archivist Meetings: Not on file  . Marital Status: Not on file    Family History  Problem Relation Age of Onset  . Cancer Maternal Grandfather        prostate  . Diabetes Mother   . Arthritis Mother    . Cancer Paternal Uncle   . Cancer Maternal Uncle        lung  . Diabetes Maternal Aunt   . Arthritis Maternal Aunt

## 2020-03-11 ENCOUNTER — Telehealth: Payer: Self-pay | Admitting: *Deleted

## 2020-03-11 NOTE — Telephone Encounter (Signed)
Received call from patient.   Reports that she has tested positive for COVID on 03/09/2020. States that Sx are not severe at this time. Reports cough and slight fever.   Advised to continue Sx management with OTC medications. Advised if chest pain, SOB, high fever unresponsive to antipyretics noted, go to ER for evaluation.

## 2020-03-14 NOTE — Telephone Encounter (Signed)
Noted  

## 2020-03-24 ENCOUNTER — Encounter: Payer: Self-pay | Admitting: Nurse Practitioner

## 2020-03-24 ENCOUNTER — Telehealth (INDEPENDENT_AMBULATORY_CARE_PROVIDER_SITE_OTHER): Payer: Self-pay | Admitting: Nurse Practitioner

## 2020-03-24 ENCOUNTER — Other Ambulatory Visit: Payer: Self-pay

## 2020-03-24 DIAGNOSIS — J0120 Acute ethmoidal sinusitis, unspecified: Secondary | ICD-10-CM

## 2020-03-24 MED ORDER — GUAIFENESIN ER 600 MG PO TB12: 600.0000 mg | ORAL_TABLET | Freq: Two times a day (BID) | ORAL | 0 refills | Status: DC | PRN

## 2020-03-24 MED ORDER — SALINE SPRAY 0.65 % NA SOLN: 1.0000 | NASAL | 0 refills | Status: DC | PRN

## 2020-03-24 MED ORDER — AMOXICILLIN 500 MG PO CAPS: 500.0000 mg | ORAL_CAPSULE | Freq: Three times a day (TID) | ORAL | 0 refills | Status: DC

## 2020-03-24 NOTE — Patient Instructions (Signed)
Sinusitis, Adult Sinusitis is inflammation of your sinuses. Sinuses are hollow spaces in the bones around your face. Your sinuses are located:  Around your eyes.  In the middle of your forehead.  Behind your nose.  In your cheekbones. Mucus normally drains out of your sinuses. When your nasal tissues become inflamed or swollen, mucus can become trapped or blocked. This allows bacteria, viruses, and fungi to grow, which leads to infection. Most infections of the sinuses are caused by a virus. Sinusitis can develop quickly. It can last for up to 4 weeks (acute) or for more than 12 weeks (chronic). Sinusitis often develops after a cold. What are the causes? This condition is caused by anything that creates swelling in the sinuses or stops mucus from draining. This includes:  Allergies.  Asthma.  Infection from bacteria or viruses.  Deformities or blockages in your nose or sinuses.  Abnormal growths in the nose (nasal polyps).  Pollutants, such as chemicals or irritants in the air.  Infection from fungi (rare). What increases the risk? You are more likely to develop this condition if you:  Have a weak body defense system (immune system).  Do a lot of swimming or diving.  Overuse nasal sprays.  Smoke. What are the signs or symptoms? The main symptoms of this condition are pain and a feeling of pressure around the affected sinuses. Other symptoms include:  Stuffy nose or congestion.  Thick drainage from your nose.  Swelling and warmth over the affected sinuses.  Headache.  Upper toothache.  A cough that may get worse at night.  Extra mucus that collects in the throat or the back of the nose (postnasal drip).  Decreased sense of smell and taste.  Fatigue.  A fever.  Sore throat.  Bad breath. How is this diagnosed? This condition is diagnosed based on:  Your symptoms.  Your medical history.  A physical exam.  Tests to find out if your condition is  acute or chronic. This may include: ? Checking your nose for nasal polyps. ? Viewing your sinuses using a device that has a light (endoscope). ? Testing for allergies or bacteria. ? Imaging tests, such as an MRI or CT scan. In rare cases, a bone biopsy may be done to rule out more serious types of fungal sinus disease. How is this treated? Treatment for sinusitis depends on the cause and whether your condition is chronic or acute.  If caused by a virus, your symptoms should go away on their own within 10 days. You may be given medicines to relieve symptoms. They include: ? Medicines that shrink swollen nasal passages (topical intranasal decongestants). ? Medicines that treat allergies (antihistamines). ? A spray that eases inflammation of the nostrils (topical intranasal corticosteroids). ? Rinses that help get rid of thick mucus in your nose (nasal saline washes).  If caused by bacteria, your health care provider may recommend waiting to see if your symptoms improve. Most bacterial infections will get better without antibiotic medicine. You may be given antibiotics if you have: ? A severe infection. ? A weak immune system.  If caused by narrow nasal passages or nasal polyps, you may need to have surgery. Follow these instructions at home: Medicines  Take, use, or apply over-the-counter and prescription medicines only as told by your health care provider. These may include nasal sprays.  If you were prescribed an antibiotic medicine, take it as told by your health care provider. Do not stop taking the antibiotic even if you start   to feel better. Hydrate and humidify  Drink enough fluid to keep your urine pale yellow. Staying hydrated will help to thin your mucus.  Use a cool mist humidifier to keep the humidity level in your home above 50%.  Inhale steam for 10-15 minutes, 3-4 times a day, or as told by your health care provider. You can do this in the bathroom while a hot shower is  running.  Limit your exposure to cool or dry air.   Rest  Rest as much as possible.  Sleep with your head raised (elevated).  Make sure you get enough sleep each night. General instructions  Apply a warm, moist washcloth to your face 3-4 times a day or as told by your health care provider. This will help with discomfort.  Wash your hands often with soap and water to reduce your exposure to germs. If soap and water are not available, use hand sanitizer.  Do not smoke. Avoid being around people who are smoking (secondhand smoke).  Keep all follow-up visits as told by your health care provider. This is important.   Contact a health care provider if:  You have a fever.  Your symptoms get worse.  Your symptoms do not improve within 10 days. Get help right away if:  You have a severe headache.  You have persistent vomiting.  You have severe pain or swelling around your face or eyes.  You have vision problems.  You develop confusion.  Your neck is stiff.  You have trouble breathing. Summary  Sinusitis is soreness and inflammation of your sinuses. Sinuses are hollow spaces in the bones around your face.  This condition is caused by nasal tissues that become inflamed or swollen. The swelling traps or blocks the flow of mucus. This allows bacteria, viruses, and fungi to grow, which leads to infection.  If you were prescribed an antibiotic medicine, take it as told by your health care provider. Do not stop taking the antibiotic even if you start to feel better.  Keep all follow-up visits as told by your health care provider. This is important. This information is not intended to replace advice given to you by your health care provider. Make sure you discuss any questions you have with your health care provider. Document Revised: 07/08/2017 Document Reviewed: 07/08/2017 Elsevier Patient Education  2021 [employer]  

## 2020-03-24 NOTE — Progress Notes (Signed)
Subjective:    Patient ID: Kara Gentry, female    DOB: 07-Sep-1975, 45 y.o.   MRN: [id number]  HPI: Kara Gentry is a 45 y.o. female presenting virtually for "feeling bad"  Chief Complaint  Patient presents with  . URI   UPPER RESPIRATORY TRACT INFECTION Reports she had COVID 2 weeks ago.  Got congested and kept sneezing, felt tired.  Reports her symptoms got better, but never felt 100%. Onset: Tuesday evening Fever: no Cough: no Shortness of breath: no Wheezing: no Chest pain: no Chest tightness: no Chest congestion: no Nasal congestion: yes Runny nose: yes; mucus turned cleared to yellow Post nasal drip: yes Sneezing: no Sore throat: yes  Neck pain: yes, with movement Swollen glands: yes Sinus pressure: yes Headache: no Face pain: no Toothache: no Ear pain: no  Ear pressure: no  Eyes red/itching:no Eye drainage/crusting: no  Nausea: no  Vomiting: no Diarrhea: no  Change in appetite: no Loss of taste/smell: no  Rash: no Fatigue: no Sick contacts: yes Strep contacts: no  Context: stable Recurrent sinusitis: no Treatments attempted: Nyquil, Dayquil Relief with OTC medications: somewhat  No Known Allergies  Outpatient Encounter Medications as of 03/24/2020  Medication Sig  . amoxicillin (AMOXIL) 500 MG capsule Take 1 capsule (500 mg total) by mouth 3 (three) times daily.  Marland Kitchen guaiFENesin (MUCINEX) 600 MG 12 hr tablet Take 1 tablet (600 mg total) by mouth 2 (two) times daily as needed for cough or to loosen phlegm.  . sodium chloride (OCEAN) 0.65 % SOLN nasal spray Place 1 spray into both nostrils as needed for congestion.  Marland Kitchen aspirin-acetaminophen-caffeine (EXCEDRIN MIGRAINE) 250-250-65 MG tablet Take 1-2 tablets by mouth daily as needed for headache.   . Multiple Vitamins-Minerals (ONE-A-DAY WOMENS PO) Take by mouth.  . naproxen sodium (ANAPROX DS) 550 MG tablet Take 1 tablet (550 mg total) by mouth 2 (two) times daily with a meal.  . topiramate (TOPAMAX)  25 MG tablet Take 1/2 tablet daily for one week Take 1 tablet daily orally starting week two.  Marland Kitchen VITAMIN D PO Take by mouth.  . [DISCONTINUED] megestrol (MEGACE) 40 MG tablet 1 tablet daily   No facility-administered encounter medications on file as of 03/24/2020.    Patient Active Problem List   Diagnosis Date Noted  . Hx of migraines 07/30/2019  . Pelvic pain in female 12/19/2016  . Menorrhagia with regular cycle 09/04/2016  . Fibroids 05/31/2016  . Vitamin D deficiency 05/23/2016  . Pelvic pain 05/21/2016    Past Medical History:  Diagnosis Date  . Anemia   . Bursitis    Right shoulder  . Cancer ([place])    soft tissue of left cheeck  . Closed bimalleolar fracture of right ankle 05/27/2017  . Fibroids 05/31/2016  . Headache   . Hemorrhoids   . 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 PONV (postoperative nausea and vomiting)   . S/P ORIF (open reduction internal fixation) fracture 05/09/17 Ankle  Right    . Seasonal allergies   . Vitamin D deficiency 05/23/2016   Take 5000 IU vitamin D3 daily    Relevant past medical, surgical, family and social history reviewed and updated as indicated. Interim medical history since our last visit reviewed.  Review of Systems Per HPI unless specifically indicated above     Objective:    There were no vitals taken for this visit.  Wt Readings from Last 3 Encounters:  12/17/19 181 lb 8 oz (82.3  kg)  12/02/19 180 lb (81.6 kg)  11/12/19 179 lb 9.6 oz (81.5 kg)    Physical Exam Vitals and nursing note reviewed.  Constitutional:      General: She is not in acute distress.    Appearance: Normal appearance. She is not toxic-appearing.  HENT:     Head: Normocephalic and atraumatic.     Right Ear: External ear normal.     Left Ear: External ear normal.     Nose: Congestion present. No rhinorrhea.     Mouth/Throat:     Mouth: Mucous membranes are moist.     Pharynx: Oropharynx is clear. No posterior oropharyngeal  erythema.  Eyes:     General: No scleral icterus.    Extraocular Movements: Extraocular movements intact.  Cardiovascular:     Comments: Unable to assess heart sounds via virtual visit. Pulmonary:     Effort: Pulmonary effort is normal. No respiratory distress.     Comments: Unable to assess lung sounds via virtual visit.  Patient talking in complete sentences during telemedicine visit without accessory muscle use. Skin:    Coloration: Skin is not jaundiced or pale.     Findings: No erythema.  Neurological:     Mental Status: She is alert and oriented to person, place, and time.  Psychiatric:        Mood and Affect: Mood normal.        Behavior: Behavior normal.        Thought Content: Thought content normal.        Judgment: Judgment normal.        Assessment & Plan:  1. Acute non-recurrent ethmoidal sinusitis Acute, ongoing x >2 weeks.  Reports symptoms from COVID-19 never fully resolved.  Will start on amoxicillin for suspected ethmoidal sinusitis.  Continue supportive care with increased fluid intake with water or electrolyte solution like pedialyte. Encouraged acetaminophen as needed for fever/pain. Encouraged salt water gargling, chloraseptic spray and throat lozenges. Encouraged OTC guaifenesin. Encouraged saline sinus flushes and/or neti with humidified air.  Symptoms should gradually improved over next 2 weeks.  Return to clinic if not feeling much improved in 1 week.  - amoxicillin (AMOXIL) 500 MG capsule; Take 1 capsule (500 mg total) by mouth 3 (three) times daily.  Dispense: 15 capsule; Refill: 0 - sodium chloride (OCEAN) 0.65 % SOLN nasal spray; Place 1 spray into both nostrils as needed for congestion.  Dispense: 88 mL; Refill: 0 - guaiFENesin (MUCINEX) 600 MG 12 hr tablet; Take 1 tablet (600 mg total) by mouth 2 (two) times daily as needed for cough or to loosen phlegm.  Dispense: 30 tablet; Refill: 0    Follow up plan: Return if symptoms worsen or fail to  improve.  Due to the catastrophic nature of the COVID-19 pandemic, this visit was completed via audio and visual contact via [facility] due to the restrictions of the COVID-19 pandemic. All issues as above were discussed and addressed. Physical exam was done as above through visual confirmation on [facility]. If it was felt that the patient should be evaluated in the office, they were directed there. The patient verbally consented to this visit."} . Location of the patient: home . Location of the provider: work . Those involved with this call:  . Provider: Carnella Guadalajara, DNP . CMA: n/a . Front Desk/Registration: Santina Evans  . Time spent on call: 20 minutes with patient face to face via video conference. More than 50% of this time was spent in counseling and coordination of  care. 30 minutes total spent in review of patient's record and preparation of their chart.  I verified patient identity using two factors (patient name and date of birth). Patient consents verbally to being seen via telemedicine visit today.

## 2020-04-06 ENCOUNTER — Ambulatory Visit (INDEPENDENT_AMBULATORY_CARE_PROVIDER_SITE_OTHER): Payer: Self-pay | Admitting: Family Medicine

## 2020-04-06 ENCOUNTER — Other Ambulatory Visit: Payer: Self-pay

## 2020-04-06 ENCOUNTER — Encounter: Payer: Self-pay | Admitting: Family Medicine

## 2020-04-06 VITALS — BP 126/68 | HR 76 | Temp 98.1°F | Resp 14 | Ht 62.0 in | Wt 190.0 lb

## 2020-04-06 DIAGNOSIS — Z8616 Personal history of COVID-19: Secondary | ICD-10-CM

## 2020-04-06 DIAGNOSIS — S29012A Strain of muscle and tendon of back wall of thorax, initial encounter: Secondary | ICD-10-CM

## 2020-04-06 DIAGNOSIS — J019 Acute sinusitis, unspecified: Secondary | ICD-10-CM

## 2020-04-06 DIAGNOSIS — T148XXA Other injury of unspecified body region, initial encounter: Secondary | ICD-10-CM

## 2020-04-06 DIAGNOSIS — B9689 Other specified bacterial agents as the cause of diseases classified elsewhere: Secondary | ICD-10-CM

## 2020-04-06 MED ORDER — PREDNISONE 20 MG PO TABS: 40.0000 mg | ORAL_TABLET | Freq: Every day | ORAL | 0 refills | Status: DC

## 2020-04-06 MED ORDER — HYDROCOD POLST-CPM POLST ER 10-8 MG/5ML PO SUER: 5.0000 mL | Freq: Two times a day (BID) | ORAL | 0 refills | Status: DC | PRN

## 2020-04-06 MED ORDER — CYCLOBENZAPRINE HCL 5 MG PO TABS: 5.0000 mg | ORAL_TABLET | Freq: Three times a day (TID) | ORAL | 0 refills | Status: DC | PRN

## 2020-04-06 MED ORDER — AMOXICILLIN-POT CLAVULANATE 875-125 MG PO TABS: 1.0000 | ORAL_TABLET | Freq: Two times a day (BID) | ORAL | 0 refills | Status: DC

## 2020-04-06 NOTE — Progress Notes (Signed)
   Subjective:    Patient ID: Kara Gentry, female    DOB: 1976-01-26, 45 y.o.   MRN: [id number]  Patient presents for Illness (X weeks- sinus pressure, yellow nasal drainage, cough that worsens at night- S/P COVID)  Pt here with ongoing sinus pressure, drainage, cough a tnight She was positive for Covid approx 4 weeks ago She then had teleleaht visit 2 weeks ago with ongoing sinus pressure and drainage she was treated with amoxicilliin, mucinex  She was taking OTC meds, nyquil minimal improvement.  She is can get the sputum out but feels it in her throat.  She denies any wheezing or tightness in her chest.  But she does cough typically in the evening.  She has been using nasal saline as well. Has some muscle pain in shoulder and back, occurred after coughing has improved past 2 days     Review Of Systems:  GEN- denies fatigue, fever, weight loss,weakness, recent illness HEENT- denies eye drainage, change in vision,+ nasal discharge, CVS- denies chest pain, palpitations RESP- denies SOB, +cough, wheeze ABD- denies N/V, change in stools, abd pain GU- denies dysuria, hematuria, dribbling, incontinence MSK- denies joint pain,+ muscle aches, injury Neuro- denies headache, dizziness, syncope, seizure activity       Objective:    BP 126/68   Pulse 76   Temp 98.1 F (36.7 C) (Temporal)   Resp 14   Ht 5\' 2"  (1.575 m)   Wt 190 lb (86.2 kg)   SpO2 97%   BMI 34.75 kg/m  GEN- NAD, alert and oriented x3 HEENT- PERRL, EOMI, non injected sclera, pink conjunctiva, MMM, oropharynx mild injection, TM clear bilat no effusion, hoarse voice  + maxillary sinus tenderness, inflammed turbinates,  Nasal drainage  Neck- Supple, no LAD CVS- RRR, no murmur RESP-CTAB MSK FROM upper ext, rotator cuff in tact  EXT- No edema Pulses- Radial 2+         Assessment & Plan:      Problem List Items Addressed This Visit   None   Visit Diagnoses    Acute bacterial rhinosinusitis    -  Primary    Ongoing sinusitis.  Recent COVID-19 infection.  Minimal response to previous treatment.  We will add prednisone Tussionex for cough.  And Augmentin Zyrtec samples given. Continue nasal saline   Relevant Medications   amoxicillin-clavulanate (AUGMENTIN) 875-125 MG tablet   predniSONE (DELTASONE) 20 MG tablet   chlorpheniramine-HYDROcodone (TUSSIONEX PENNKINETIC ER) 10-8 MG/5ML SUER   History of COVID-19       Muscle strain       Muscular strain or cough.  No red flags on exam.  sHe can use topical anti-inflammatory and also given a short prescription for muscle relaxant      Note: This dictation was prepared with Dragon dictation along with smaller phrase technology. Any transcriptional errors that result from this process are unintentional.

## 2020-04-06 NOTE — Patient Instructions (Signed)
Take the steroids in the morning Take new antibiotics Nasal saline is fine Cough medicine as needed Muscle relaxer  CHANGE TO Kara Gentry

## 2020-05-23 ENCOUNTER — Ambulatory Visit (INDEPENDENT_AMBULATORY_CARE_PROVIDER_SITE_OTHER): Payer: BC Managed Care – PPO | Admitting: Orthopedic Surgery

## 2020-05-23 ENCOUNTER — Encounter: Payer: Self-pay | Admitting: Orthopedic Surgery

## 2020-05-23 ENCOUNTER — Other Ambulatory Visit: Payer: Self-pay

## 2020-05-23 ENCOUNTER — Ambulatory Visit: Payer: BC Managed Care – PPO

## 2020-05-23 VITALS — BP 118/74 | HR 85 | Ht 62.0 in | Wt 192.4 lb

## 2020-05-23 DIAGNOSIS — M25571 Pain in right ankle and joints of right foot: Secondary | ICD-10-CM | POA: Diagnosis not present

## 2020-05-23 NOTE — Progress Notes (Signed)
Chief Complaint  Patient presents with  . Ankle Pain    R/ doing better now, no pain at all.   45 year old female 3 years status post ORIF right ankle presents with pain in her Achilles after wearing fashionable rain boot for full day  Patient reports no problems with her ankle surgery until that point  The pain is completely resolved but since she had had surgery she wanted ankle checked  Review of Systems  Musculoskeletal: Negative for joint pain.  Neurological: Negative for tingling and weakness.   BP 118/74   Pulse 85   Ht 5\' 2"  (1.575 m)   Wt 192 lb 6 oz (87.3 kg)   BMI 35.19 kg/m  Physical Exam Constitutional:      General: She is not in acute distress.    Appearance: Normal appearance. She is normal weight.  HENT:     Head: Normocephalic and atraumatic.  Musculoskeletal:     Comments: Right ankle both incisions healed no tenderness  She does have chronic swelling just below the fibula  Her ankle motion is excellent her ankle is stable she has no tenderness in her Achilles and it is intact  Skin:    General: Skin is warm.     Capillary Refill: Capillary refill takes less than 2 seconds.  Neurological:     General: No focal deficit present.     Mental Status: She is alert.     Gait: Gait normal.  Psychiatric:        Mood and Affect: Mood normal.    Images were obtained and compared to the previous film taken approximately 2 years ago she has medial lateral hardware interfrag screw in the lateral side with a lateral plate and then on the medial side she has a suture anchor with a fibrous union of the medial malleolus and an intact ankle mortise  Encounter Diagnosis  Name Primary?  . Pain in right ankle and joints of right foot Yes    No problems noted with her fixation or her ankle her pain is resolved we advised her to wear normal comfortable shoes and if she has to wear ankle boot is to bring another pair shoes to work  Follow-up as needed

## 2020-05-26 ENCOUNTER — Telehealth (INDEPENDENT_AMBULATORY_CARE_PROVIDER_SITE_OTHER): Payer: BC Managed Care – PPO | Admitting: Nurse Practitioner

## 2020-05-26 ENCOUNTER — Other Ambulatory Visit: Payer: Self-pay

## 2020-05-26 DIAGNOSIS — J0120 Acute ethmoidal sinusitis, unspecified: Secondary | ICD-10-CM | POA: Diagnosis not present

## 2020-05-26 MED ORDER — GUAIFENESIN ER 600 MG PO TB12: 600.0000 mg | ORAL_TABLET | Freq: Two times a day (BID) | ORAL | 0 refills | Status: DC | PRN

## 2020-05-26 NOTE — Progress Notes (Signed)
Subjective:    Patient ID: Kara Gentry, female    DOB: 06/15/1975, 45 y.o.   MRN: [id number]  HPI: Kara Gentry is a 45 y.o. female presenting virtually for sinus infection.  Chief Complaint  Patient presents with  . Sinusitis   UPPER RESPIRATORY TRACT INFECTION Onset: yesterday Worst symptom: neck up Fever: no Cough: no Shortness of breath: no Wheezing: no Chest pain: no Chest tightness: no Chest congestion: no Nasal congestion: yes Runny nose: no Post nasal drip: no Sneezing: yes Sore throat: no Swollen glands: no Sinus pressure: in nose Headache: yes Face pain: no Toothache: no Ear pain: no  Ear pressure: no  Eyes red/itching:no Eye drainage/crusting: no  Nausea: no  Vomiting: no Diarrhea: no  Change in appetite: no  Loss of taste/smell: no  Rash: no Fatigue: no Sick contacts: no Strep contacts: no  Context: worse Recurrent sinusitis: no Treatments attempted: nothing Relief with OTC medications: n/a  No Known Allergies  Outpatient Encounter Medications as of 05/26/2020  Medication Sig  . guaiFENesin (MUCINEX) 600 MG 12 hr tablet Take 1 tablet (600 mg total) by mouth 2 (two) times daily as needed for cough or to loosen phlegm.  Marland Kitchen aspirin-acetaminophen-caffeine (EXCEDRIN MIGRAINE) 250-250-65 MG tablet Take 1-2 tablets by mouth daily as needed for headache.   . cyclobenzaprine (FLEXERIL) 5 MG tablet Take 1 tablet (5 mg total) by mouth 3 (three) times daily as needed for muscle spasms. (Patient not taking: Reported on 05/23/2020)  . Multiple Vitamins-Minerals (ONE-A-DAY WOMENS PO) Take by mouth.  . naproxen sodium (ANAPROX DS) 550 MG tablet Take 1 tablet (550 mg total) by mouth 2 (two) times daily with a meal. (Patient not taking: Reported on 05/23/2020)  . sodium chloride (OCEAN) 0.65 % SOLN nasal spray Place 1 spray into both nostrils as needed for congestion.  . topiramate (TOPAMAX) 25 MG tablet Take 1/2 tablet daily for one week Take 1 tablet daily  orally starting week two. (Patient not taking: Reported on 05/23/2020)  . VITAMIN D PO Take by mouth.  . [DISCONTINUED] amoxicillin-clavulanate (AUGMENTIN) 875-125 MG tablet Take 1 tablet by mouth 2 (two) times daily. (Patient not taking: Reported on 05/23/2020)  . [DISCONTINUED] chlorpheniramine-HYDROcodone (TUSSIONEX PENNKINETIC ER) 10-8 MG/5ML SUER Take 5 mLs by mouth every 12 (twelve) hours as needed. (Patient not taking: Reported on 05/23/2020)  . [DISCONTINUED] predniSONE (DELTASONE) 20 MG tablet Take 2 tablets (40 mg total) by mouth daily with breakfast. (Patient not taking: Reported on 05/23/2020)   No facility-administered encounter medications on file as of 05/26/2020.    Patient Active Problem List   Diagnosis Date Noted  . Hx of migraines 07/30/2019  . Pelvic pain in female 12/19/2016  . Menorrhagia with regular cycle 09/04/2016  . Fibroids 05/31/2016  . Vitamin D deficiency 05/23/2016  . Pelvic pain 05/21/2016    Past Medical History:  Diagnosis Date  . Anemia   . Bursitis    Right shoulder  . Cancer ([place])    soft tissue of left cheeck  . Closed bimalleolar fracture of right ankle 05/27/2017  . Fibroids 05/31/2016  . Headache   . Hemorrhoids   . 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 PONV (postoperative nausea and vomiting)   . S/P ORIF (open reduction internal fixation) fracture 05/09/17 Ankle  Right    . Seasonal allergies   . Vitamin D deficiency 05/23/2016   Take 5000 IU vitamin D3 daily    Relevant past medical, surgical, family and  social history reviewed and updated as indicated. Interim medical history since our last visit reviewed.  Review of Systems Per HPI unless specifically indicated above     Objective:    There were no vitals taken for this visit.  Wt Readings from Last 3 Encounters:  05/23/20 192 lb 6 oz (87.3 kg)  04/06/20 190 lb (86.2 kg)  12/17/19 181 lb 8 oz (82.3 kg)    Physical Exam Physical examination unable to be  performed due to lack of equipment.  Patient talking in complete sentences during telemedicine visit.     Assessment & Plan:  1. Acute non-recurrent ethmoidal sinusitis Acute.  Was treated twice earlier this year with Amoxicillin/Augmentin.  Given symptoms started yesterday, likely viral in etiology.  Reassured patient that symptoms are most consistent with a viral upper respiratory infection and explained lack of efficacy of antibiotics against viruses.  Discussed expected course and features suggestive of secondary bacterial infection.  Continue supportive care. Increase fluid intake with water or electrolyte solution like pedialyte. Encouraged acetaminophen as needed for fever/pain. Encouraged salt water gargling, chloraseptic spray and throat lozenges. Encouraged OTC guaifenesin. Encouraged saline sinus flushes and/or neti with humidified air.  Follow up if symptoms persist greater than 7 days despite OTC therapy.  - guaiFENesin (MUCINEX) 600 MG 12 hr tablet; Take 1 tablet (600 mg total) by mouth 2 (two) times daily as needed for cough or to loosen phlegm.  Dispense: 30 tablet; Refill: 0    Follow up plan: Return if symptoms worsen or fail to improve.  This visit was completed via telephone due to the restrictions of the COVID-19 pandemic. All issues as above were discussed and addressed but no physical exam was performed. If it was felt that the patient should be evaluated in the office, they were directed there. The patient verbally consented to this visit. Patient was unable to complete an audio/visual visit due to Technical difficulties.  I was unable to visualize the patient on the [place] video despite both being connected to the video.  The visit was then transitioned to a telephone visit. . Location of the patient: home . Location of the provider: work . Those involved with this call:  . Provider: Noemi Chapel, DNP, FNP-C . CMA: n/a . Front Desk/Registration: Eliezer Mccoy   . Time spent on call: 11 minutes on the phone discussing health concerns. 14 minutes total spent in review of patient's record and preparation of their chart.  I verified patient identity using two factors (patient name and date of birth). Patient consents verbally to being seen via telemedicine visit today.

## 2020-08-01 ENCOUNTER — Encounter: Payer: Self-pay | Admitting: Family Medicine

## 2020-08-01 LAB — HM COLONOSCOPY

## 2020-08-03 ENCOUNTER — Encounter: Payer: BC Managed Care – PPO | Admitting: Nurse Practitioner

## 2020-08-04 ENCOUNTER — Other Ambulatory Visit: Payer: Self-pay | Admitting: Nurse Practitioner

## 2020-08-08 ENCOUNTER — Telehealth: Payer: Self-pay

## 2020-08-10 NOTE — Telephone Encounter (Signed)
No action required, closing chart

## 2020-09-13 ENCOUNTER — Other Ambulatory Visit (HOSPITAL_COMMUNITY): Payer: Self-pay | Admitting: Nurse Practitioner

## 2020-09-13 DIAGNOSIS — Z1231 Encounter for screening mammogram for malignant neoplasm of breast: Secondary | ICD-10-CM

## 2020-10-10 ENCOUNTER — Other Ambulatory Visit: Payer: Self-pay

## 2020-10-10 ENCOUNTER — Ambulatory Visit (HOSPITAL_COMMUNITY)
Admission: RE | Admit: 2020-10-10 | Discharge: 2020-10-10 | Disposition: A | Discharge status: Home / Self Care | Payer: BC Managed Care – PPO | Source: Ambulatory Visit | Attending: Nurse Practitioner | Admitting: Nurse Practitioner

## 2020-10-10 DIAGNOSIS — Z1231 Encounter for screening mammogram for malignant neoplasm of breast: Secondary | ICD-10-CM | POA: Insufficient documentation

## 2020-10-12 ENCOUNTER — Other Ambulatory Visit (HOSPITAL_COMMUNITY): Payer: Self-pay | Admitting: Nurse Practitioner

## 2020-10-12 DIAGNOSIS — R928 Other abnormal and inconclusive findings on diagnostic imaging of breast: Secondary | ICD-10-CM

## 2020-10-18 ENCOUNTER — Ambulatory Visit (HOSPITAL_COMMUNITY)
Admission: RE | Admit: 2020-10-18 | Discharge: 2020-10-18 | Disposition: A | Discharge status: Home / Self Care | Payer: BC Managed Care – PPO | Source: Ambulatory Visit | Attending: Nurse Practitioner | Admitting: Nurse Practitioner

## 2020-10-18 ENCOUNTER — Other Ambulatory Visit: Payer: Self-pay

## 2020-10-18 DIAGNOSIS — R928 Other abnormal and inconclusive findings on diagnostic imaging of breast: Secondary | ICD-10-CM | POA: Insufficient documentation

## 2021-01-16 ENCOUNTER — Telehealth: Payer: BC Managed Care – PPO | Admitting: Nurse Practitioner

## 2021-01-16 ENCOUNTER — Other Ambulatory Visit: Payer: Self-pay

## 2021-01-16 ENCOUNTER — Encounter: Payer: Self-pay | Admitting: Nurse Practitioner

## 2021-01-16 DIAGNOSIS — J069 Acute upper respiratory infection, unspecified: Secondary | ICD-10-CM

## 2021-01-16 NOTE — Progress Notes (Signed)
Subjective:    Patient ID: Kara Gentry, female    DOB: 1975-04-01, 45 y.o.   MRN: [id number]  HPI: [name] is a 45 y.o. female presenting virtually for feeling unwell.   Chief Complaint  Patient presents with   Cough   URI   UPPER RESPIRATORY TRACT INFECTION Onset: yesterday Fever: no Body aches: no - shoulder has been hurting for 1-2 weeks Chills: no Night sweats: yes Cough: yes; congested Shortness of breath: no Wheezing: no Chest pain: no Chest tightness: no Chest congestion: no Nasal congestion: yes; at night Runny nose: no Post nasal drip: no Sneezing: yes Sore throat: yes; tickle  Swollen glands: no Sinus pressure: no Headache: no Face pain: no Toothache: no Ear pain: no  Ear pressure: no  Eyes red/itching:no Eye drainage/crusting: no  Nausea: yes; queasy Vomiting: no Diarrhea: no  Change in appetite:  yes; decreased   Loss of taste/smell: no  Rash: no Fatigue: yes Sick contacts: yes; 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    cyclobenzaprine (FLEXERIL) 5 MG tablet Take 1 tablet (5 mg total) by mouth 3 (three) times daily as needed for muscle spasms. (Patient not taking: Reported on 05/23/2020)   guaiFENesin (MUCINEX) 600 MG 12 hr tablet Take 1 tablet (600 mg total) by mouth 2 (two) times daily as needed for cough or to loosen phlegm.   Multiple Vitamins-Minerals (ONE-A-DAY WOMENS PO) Take by mouth.   naproxen sodium (ANAPROX DS) 550 MG tablet Take 1 tablet (550 mg total) by mouth 2 (two) times daily with a meal. (Patient not taking: Reported on 05/23/2020)   sodium chloride (OCEAN) 0.65 % SOLN nasal spray Place 1 spray into both  nostrils as needed for congestion.   topiramate (TOPAMAX) 25 MG tablet Take 1/2 tablet daily for one week Take 1 tablet daily orally starting week two. (Patient not taking: Reported on 05/23/2020)   VITAMIN D PO Take by mouth.   No facility-administered encounter medications on file as of 01/16/2021.    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 Interim medical history since our last visit reviewed.  Review of Systems Per HPI unless specifically indicated above     Objective:    There were no vitals taken for this visit.  Wt Readings from Last  3 Encounters:  05/23/20 192 lb 6 oz (87.3 kg)  04/06/20 190 lb (86.2 kg)  12/17/19 181 lb 8 oz (82.3 kg)    Physical Exam Vitals and nursing note reviewed.  Constitutional:      General: She is not in acute distress.    Appearance: Normal appearance. She is not ill-appearing or toxic-appearing.  HENT:     Head: Normocephalic.     Nose: No congestion or rhinorrhea.     Mouth/Throat:     Mouth: Mucous membranes are moist.     Pharynx: Oropharynx is clear.  Eyes:     General: No scleral icterus.       Right eye: No discharge.        Left eye:  No discharge.  Cardiovascular:     Comments: Unable to assess heart sounds via virtual visit. Pulmonary:     Effort: Pulmonary effort is normal. No respiratory distress.     Comments: Unable to assess breath sounds via virtual visit.  Patient talking in complete sentences during telemedicine visit without accessory muscle use. Skin:    Coloration: Skin is not jaundiced or pale.     Findings: No erythema.  Neurological:     Mental Status: She is alert and oriented to person, place, and time.  Psychiatric:        Mood and Affect: Mood normal.        Behavior: Behavior normal.        Thought Content: Thought content normal.        Judgment: Judgment normal.      Assessment & Plan:  1. Viral upper respiratory tract infection Acute x 1 day.  Encouraged respiratory panel testing.  Reassured patient that symptoms and exam findings are most consistent with a viral upper respiratory infection and explained lack of efficacy of antibiotics against viruses.  Discussed expected course and features suggestive of secondary bacterial infection.  Continue supportive care. Increase fluid intake with water or electrolyte solution like pedialyte. Encouraged acetaminophen as needed for fever/pain. Encouraged salt water gargling, chloraseptic spray and throat lozenges. Encouraged OTC guaifenesin. Encouraged saline sinus flushes and/or neti with humidified air.   Follow up with no improvement in symptoms after 7-10 days.  Note sent through [facility] for employer.   - SARS-CoV-2 RNA (COVID-19) and Respiratory Viral Panel, Qualitative NAAT    Follow up plan: Return if symptoms worsen or fail to improve.   Due to the catastrophic nature of the COVID-19 pandemic, this video visit was completed soley via audio and visual contact via [facility] due to the restrictions of the COVID-19 pandemic.  All issues as above were discussed and addressed. Physical exam was done as above through visual confirmation on [employer].  If it was felt that the patient should be evaluated in the office, they were directed there. The patient verbally consented to this visit. Location of the patient: work Location of the provider: home Those involved with this call:  Provider: Noemi Chapel, DNP, FNP-C CMA: n/a Front Desk/Registration: Eliezer Mccoy  Time spent on call:  12 minutes with patient face to face via video conference. More than 50% of this time was spent in counseling and coordination of care. 15 minutes total spent in review of patient's record and preparation of their chart. I verified patient identity using two factors (patient name and date of birth). Patient consents verbally to being seen via telemedicine visit today.

## 2021-01-18 ENCOUNTER — Encounter: Payer: Self-pay | Admitting: Nurse Practitioner

## 2021-01-20 LAB — SARS-COV-2 RNA (COVID-19) RESP VIRAL PNL QL NAAT

## 2021-01-23 ENCOUNTER — Encounter: Payer: Self-pay | Admitting: Nurse Practitioner

## 2021-01-23 MED ORDER — BENZONATATE 100 MG PO CAPS: 100.0000 mg | ORAL_CAPSULE | Freq: Two times a day (BID) | ORAL | 0 refills | Status: DC | PRN

## 2021-01-23 NOTE — Addendum Note (Signed)
Addended by: Noemi Chapel A on: 01/23/2021 07:39 AM   Modules accepted: Orders

## 2021-09-11 ENCOUNTER — Ambulatory Visit
Admission: EM | Admit: 2021-09-11 | Discharge: 2021-09-11 | Disposition: A | Discharge status: Home / Self Care | Payer: BC Managed Care – PPO | Attending: Family Medicine | Admitting: Family Medicine

## 2021-09-11 DIAGNOSIS — S29011A Strain of muscle and tendon of front wall of thorax, initial encounter: Secondary | ICD-10-CM | POA: Diagnosis not present

## 2021-09-11 MED ORDER — CYCLOBENZAPRINE HCL 10 MG PO TABS: 10.0000 mg | ORAL_TABLET | Freq: Three times a day (TID) | ORAL | 0 refills | Status: DC | PRN

## 2021-09-11 MED ORDER — NAPROXEN 500 MG PO TABS: 500.0000 mg | ORAL_TABLET | Freq: Two times a day (BID) | ORAL | 0 refills | Status: DC | PRN

## 2021-09-11 NOTE — ED Triage Notes (Signed)
Pt presents with c/o right shoulder pain that began Saturday after cleaning house

## 2021-09-29 ENCOUNTER — Ambulatory Visit: Payer: BC Managed Care – PPO | Admitting: Family Medicine

## 2021-09-29 ENCOUNTER — Encounter: Payer: Self-pay | Admitting: Family Medicine

## 2021-09-29 VITALS — BP 110/77 | HR 87 | Ht 62.0 in | Wt 196.0 lb

## 2021-09-29 DIAGNOSIS — Z0001 Encounter for general adult medical examination with abnormal findings: Secondary | ICD-10-CM | POA: Diagnosis not present

## 2021-09-29 DIAGNOSIS — Z8669 Personal history of other diseases of the nervous system and sense organs: Secondary | ICD-10-CM

## 2021-09-29 DIAGNOSIS — G43909 Migraine, unspecified, not intractable, without status migrainosus: Secondary | ICD-10-CM | POA: Insufficient documentation

## 2021-09-29 DIAGNOSIS — E559 Vitamin D deficiency, unspecified: Secondary | ICD-10-CM | POA: Diagnosis not present

## 2021-09-29 DIAGNOSIS — G43009 Migraine without aura, not intractable, without status migrainosus: Secondary | ICD-10-CM

## 2021-09-29 DIAGNOSIS — R7301 Impaired fasting glucose: Secondary | ICD-10-CM

## 2021-09-29 MED ORDER — TOPIRAMATE 25 MG PO TABS: ORAL_TABLET | ORAL | 0 refills | Status: DC

## 2021-09-29 NOTE — Progress Notes (Signed)
New Patient Office Visit  Subjective:  Patient ID: Kara Gentry, female    DOB: [birth year]  Age: 46 y.o. MRN: [id number]  CC:  Chief Complaint  Patient presents with   New Patient (Initial Visit)    Pt establishing care, no health concerns just need a PCP.     HPI Kara Gentry is a 46 y.o. female with past medical history of Migraines presents for establishing care.  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   PONV (postoperative nausea and vomiting)    S/P ORIF (open reduction internal fixation) fracture 05/09/17 Ankle  Right     Seasonal allergies    Vitamin D deficiency 05/23/2016   Take 5000 IU vitamin D3 daily    Past Surgical History:  Procedure Laterality Date   APPLICATION OF WOUND VAC  01/03/2012   Procedure: APPLICATION OF WOUND VAC;  Surgeon: Jonnie Kind, MD;  Location: [facility];  Service: Gynecology;  Laterality: N/A;   APPLICATION OF WOUND VAC  01/05/2012   Procedure: APPLICATION OF WOUND VAC;  Surgeon: Jonnie Kind, MD;  Location: [facility];  Service: Gynecology;  Laterality: N/A;   APPLICATION OF WOUND VAC  01/07/2012   Procedure: APPLICATION OF WOUND VAC;  Surgeon: Jonnie Kind, MD;  Location: [facility];  Service: Gynecology;;   APPLICATION OF WOUND VAC  01/11/2012   Procedure: APPLICATION OF WOUND VAC;  Surgeon: Jonnie Kind, MD;  Location: [facility];  Service: Gynecology;  Laterality: N/A;  Fasicial Closure   CESAREAN SECTION  12/22/2011   Procedure: CESAREAN SECTION;  Surgeon: Donnamae Jude, MD;  Location: [facility];  Service: Obstetrics;  Laterality: N/A;  Primary cesarean section with delivery of Baby A girl at 74. Baby B girl at 63.   CESAREAN SECTION N/A    Phreesia 07/26/2019   DILITATION &  CURRETTAGE/HYSTROSCOPY WITH NOVASURE ABLATION N/A 09/04/2016   Procedure: HYSTEROSCOPY WITH NOVASURE ABLATION;  Surgeon: Jonnie Kind, MD;  Location: [facility];  Service: Gynecology;  Laterality: N/A;   ECTOPIC PREGNANCY SURGERY     FRACTURE SURGERY N/A    Phreesia 07/26/2019   INCISION AND DRAINAGE OF WOUND  01/07/2012   Procedure: IRRIGATION AND DEBRIDEMENT WOUND;  Surgeon: Jonnie Kind, MD;  Location: [facility];  Service: Gynecology;;   INSERTION OF MESH  01/03/2012   Procedure: INSERTION OF MESH;  Surgeon: Jonnie Kind, MD;  Location: [facility];  Service: Gynecology;  Laterality: N/A;   MANDIBLE SURGERY Right cancer   Cancerous mass   NASAL SINUS SURGERY     ORIF ANKLE FRACTURE Right 05/09/2017   Procedure: OPEN REDUCTION INTERNAL FIXATION (ORIF) ANKLE FRACTURE;  Surgeon: Carole Civil, MD;  Location: [facility];  Service: Orthopedics;  Laterality: Right;   SECONDARY CLOSURE OF WOUND  01/22/2012   Procedure: SECONDARY CLOSURE OF WOUND;  Surgeon: Jonnie Kind, MD;  Location: [facility];  Service: Gynecology;  Laterality: N/A;   WOUND EXPLORATION  01/03/2012   Necrotizing fascitis at C section incision   WOUND EXPLORATION  01/05/2012   Procedure: WOUND EXPLORATION;  Surgeon: Jonnie Kind, MD;  Location: [facility];  Service: Gynecology;  Laterality: N/A;  Re-Exploration  of Wound Abscess with Debridement   WOUND EXPLORATION  01/07/2012   Procedure: WOUND EXPLORATION;  Surgeon: Jonnie Kind, MD;  Location: [facility];  Service: Gynecology;;   WOUND EXPLORATION  01/11/2012   Procedure: WOUND EXPLORATION;  Surgeon: Jonnie Kind, MD;  Location: [facility];  Service: Gynecology;  Laterality: N/A;  Debridment of Wound Abscess   WOUND EXPLORATION  01/22/2012   Procedure: WOUND EXPLORATION;  Surgeon: Jonnie Kind, MD;  Location: [facility];  Service: Gynecology;  Laterality: N/A;  Delayed Secondary Wound Closure   WRIST SURGERY Left    Cyst removal    Family History  Problem Relation Age of Onset    Cancer Maternal Grandfather        prostate   Diabetes Mother    Arthritis Mother    Cancer Paternal Uncle    Cancer Maternal Uncle        lung   Diabetes Maternal Aunt    Arthritis Maternal Aunt     Social History   Socioeconomic History   Marital status: Married    Spouse name: Not on file   Number of children: Not on file   Years of education: masters   Highest education level: Not on file  Occupational History   Occupation: Product manager: [employer]  Tobacco Use   Smoking status: Never   Smokeless tobacco: Never  Vaping Use   Vaping Use: Never used  Substance and Sexual Activity   Alcohol use: No   Drug use: No   Sexual activity: Yes    Birth control/protection: Surgical    Comment: ablation  Other Topics Concern   Not on file  Social History Narrative   Not on file   Social Determinants of Health   Financial Resource Strain: Not on file  Food Insecurity: Not on file  Transportation Needs: Not on file  Physical Activity: Not on file  Stress: Not on file  Social Connections: Not on file  Intimate Partner Violence: Not on file    ROS Review of Systems  Constitutional:  Negative for diaphoresis, fatigue and fever.  HENT:  Negative for sinus pressure, sinus pain and sore throat.   Eyes:  Negative for pain, redness and itching.  Respiratory:  Negative for chest tightness and shortness of breath.   Cardiovascular:  Negative for chest pain and palpitations.  Gastrointestinal:  Negative for diarrhea, nausea and vomiting.  Endocrine: Negative for polydipsia, polyphagia and polyuria.  Genitourinary:  Negative for menstrual problem, urgency and vaginal pain.  Musculoskeletal:  Negative for back pain and neck stiffness.  Skin:  Negative for pallor and rash.  Neurological:  Negative for dizziness, weakness and headaches.  Psychiatric/Behavioral:  Negative for self-injury and suicidal ideas.     Objective:   Today's Vitals: BP 110/77   Pulse  87   Ht '5\' 2"'  (1.575 m)   Wt 196 lb 0.6 oz (88.9 kg)   SpO2 96%   BMI 35.86 kg/m   Physical Exam HENT:     Head: Normocephalic.     Right Ear: External ear normal.     Left Ear: External ear normal.     Nose: No congestion.     Mouth/Throat:     Mouth: Mucous membranes are moist.  Eyes:     Extraocular Movements: Extraocular movements intact.     Pupils: Pupils are equal, round, and reactive to light.  Cardiovascular:     Rate and Rhythm: Normal rate and regular rhythm.  Pulses: Normal pulses.     Heart sounds: Normal heart sounds.  Pulmonary:     Effort: Pulmonary effort is normal.     Breath sounds: Normal breath sounds.  Abdominal:     Palpations: Abdomen is soft.     Tenderness: There is no right CVA tenderness or left CVA tenderness.  Musculoskeletal:     Cervical back: No rigidity.     Right lower leg: No edema.     Left lower leg: No edema.  Skin:    Findings: No lesion or rash.  Neurological:     Mental Status: She is alert and oriented to person, place, and time.  Psychiatric:     Comments: Normal affect     Assessment & Plan:   Problem List Items Addressed This Visit       Cardiovascular and Mediastinum   Migraine - Primary    The last migraines episode was 1 week ago Notes to taking Excedrin with relief of symptoms Reports to taking Topamax as needed and request a refill Report that her migraines are triggered by decreased PO intake Refilled Topamax      Relevant Medications   topiramate (TOPAMAX) 25 MG tablet     Other   Vitamin D deficiency   Relevant Orders   Vitamin D (25 hydroxy)   Hx of migraines   Relevant Medications   topiramate (TOPAMAX) 25 MG tablet   Other Visit Diagnoses     IFG (impaired fasting glucose)       Relevant Orders   Hemoglobin A1C   Encounter for general adult medical examination with abnormal findings       Relevant Orders   CBC with Differential/Platelet   CMP14+EGFR   TSH + free T4   Lipid Profile        Outpatient Encounter Medications as of 09/29/2021  Medication Sig   aspirin-acetaminophen-caffeine (EXCEDRIN MIGRAINE) 250-250-65 MG tablet Take 1-2 tablets by mouth daily as needed for headache.    cyclobenzaprine (FLEXERIL) 10 MG tablet Take 1 tablet (10 mg total) by mouth 3 (three) times daily as needed for muscle spasms. Do not drink alcohol or drive while taking this medication.  May cause drowsiness.   Multiple Vitamins-Minerals (ONE-A-DAY WOMENS PO) Take by mouth.   VITAMIN D PO Take by mouth.   cyclobenzaprine (FLEXERIL) 5 MG tablet Take 1 tablet (5 mg total) by mouth 3 (three) times daily as needed for muscle spasms. (Patient not taking: Reported on 05/23/2020)   guaiFENesin (MUCINEX) 600 MG 12 hr tablet Take 1 tablet (600 mg total) by mouth 2 (two) times daily as needed for cough or to loosen phlegm. (Patient not taking: Reported on 09/29/2021)   naproxen (NAPROSYN) 500 MG tablet Take 1 tablet (500 mg total) by mouth 2 (two) times daily as needed. (Patient not taking: Reported on 09/29/2021)   naproxen sodium (ANAPROX DS) 550 MG tablet Take 1 tablet (550 mg total) by mouth 2 (two) times daily with a meal. (Patient not taking: Reported on 05/23/2020)   sodium chloride (OCEAN) 0.65 % SOLN nasal spray Place 1 spray into both nostrils as needed for congestion. (Patient not taking: Reported on 09/29/2021)   topiramate (TOPAMAX) 25 MG tablet Take 1/2 tablet daily for one week Take 1 tablet daily orally starting week two.   [DISCONTINUED] benzonatate (TESSALON) 100 MG capsule Take 1 capsule (100 mg total) by mouth 2 (two) times daily as needed for cough. Take at night time and monitor for drowsiness.  If this  medication makes you drowsy, so not take while driving or operating heavy machinery.   [DISCONTINUED] topiramate (TOPAMAX) 25 MG tablet Take 1/2 tablet daily for one week Take 1 tablet daily orally starting week two. (Patient not taking: Reported on 05/23/2020)   No facility-administered  encounter medications on file as of 09/29/2021.    Follow-up: Return in about 3 months (around 12/30/2021).   Alvira Monday, FNP

## 2021-09-29 NOTE — Patient Instructions (Addendum)
I appreciate the opportunity to provide care to you today!    Follow up:  3 months  Labs: please stop by the lab during the week  to get your blood drawn (CBC, CMP, TSH, Lipid profile, HgA1c, Vit D)   Please pick up your refills at the pharmacy   Please continue to a heart-healthy diet and increase your physical activities. Try to exercise for 30mns at least three times a week.      It was a pleasure to see you and I look forward to continuing to work together on your health and well-being. Please do not hesitate to call the office if you need care or have questions about your care.   Have a wonderful day and week. With Gratitude, GAlvira MondayMSN, FNP-BC

## 2021-09-29 NOTE — Assessment & Plan Note (Addendum)
The last migraines episode was 1 week ago Notes to taking Excedrin with relief of symptoms Reports to taking Topamax as needed and request a refill Report that her migraines are triggered by decreased PO intake Refilled Topamax

## 2021-10-03 LAB — CBC WITH DIFFERENTIAL/PLATELET
Basophils Absolute: 0 10*3/uL (ref 0.0–0.2)
Basos: 1 %
EOS (ABSOLUTE): 0.1 10*3/uL (ref 0.0–0.4)
Eos: 2 %
Hematocrit: 38.4 % (ref 34.0–46.6)
Hemoglobin: 12.3 g/dL (ref 11.1–15.9)
Immature Grans (Abs): 0 10*3/uL (ref 0.0–0.1)
Immature Granulocytes: 0 %
Lymphocytes Absolute: 1.8 10*3/uL (ref 0.7–3.1)
Lymphs: 36 %
MCH: 27 pg (ref 26.6–33.0)
MCHC: 32 g/dL (ref 31.5–35.7)
MCV: 84 fL (ref 79–97)
Monocytes Absolute: 0.3 10*3/uL (ref 0.1–0.9)
Monocytes: 6 %
Neutrophils Absolute: 2.7 10*3/uL (ref 1.4–7.0)
Neutrophils: 55 %
Platelets: 294 10*3/uL (ref 150–450)
RBC: 4.56 x10E6/uL (ref 3.77–5.28)
RDW: 13.1 % (ref 11.7–15.4)
WBC: 4.8 10*3/uL (ref 3.4–10.8)

## 2021-10-03 LAB — CMP14+EGFR
ALT: 12 IU/L (ref 0–32)
AST: 13 IU/L (ref 0–40)
Albumin/Globulin Ratio: 1.6 (ref 1.2–2.2)
Albumin: 4.2 g/dL (ref 3.9–4.9)
Alkaline Phosphatase: 54 IU/L (ref 44–121)
BUN/Creatinine Ratio: 31 — ABNORMAL HIGH (ref 9–23)
BUN: 19 mg/dL (ref 6–24)
Bilirubin Total: 0.5 mg/dL (ref 0.0–1.2)
CO2: 20 mmol/L (ref 20–29)
Calcium: 9 mg/dL (ref 8.7–10.2)
Chloride: 106 mmol/L (ref 96–106)
Creatinine, Ser: 0.62 mg/dL (ref 0.57–1.00)
Globulin, Total: 2.6 g/dL (ref 1.5–4.5)
Glucose: 99 mg/dL (ref 70–99)
Potassium: 4 mmol/L (ref 3.5–5.2)
Sodium: 140 mmol/L (ref 134–144)
Total Protein: 6.8 g/dL (ref 6.0–8.5)
eGFR: 111 mL/min/{1.73_m2} (ref >59)

## 2021-10-03 LAB — LIPID PANEL
Chol/HDL Ratio: 3.6 ratio (ref 0.0–4.4)
Cholesterol, Total: 185 mg/dL (ref 100–199)
HDL: 52 mg/dL (ref >39)
LDL Chol Calc (NIH): 125 mg/dL — ABNORMAL HIGH (ref 0–99)
Triglycerides: 39 mg/dL (ref 0–149)
VLDL Cholesterol Cal: 8 mg/dL (ref 5–40)

## 2021-10-03 LAB — TSH+FREE T4
Free T4: 1.27 ng/dL (ref 0.82–1.77)
TSH: 0.857 u[IU]/mL (ref 0.450–4.500)

## 2021-10-03 LAB — HEMOGLOBIN A1C
Est. average glucose Bld gHb Est-mCnc: 120 mg/dL
Hgb A1c MFr Bld: 5.8 % — ABNORMAL HIGH (ref 4.8–5.6)

## 2021-10-03 LAB — VITAMIN D 25 HYDROXY (VIT D DEFICIENCY, FRACTURES): Vit D, 25-Hydroxy: 41.1 ng/mL (ref 30.0–100.0)

## 2021-10-03 NOTE — Progress Notes (Signed)
Please inform the patient that she has prediabetes and her LDL is slightly elevated. I recommend increased physical activities, low cabs, fat and sugar diet.

## 2021-10-19 NOTE — ED Provider Notes (Signed)
[facility]    CSN: [id number] Arrival date & time: 09/11/21  1935      History   Chief Complaint Chief Complaint  Patient presents with   Shoulder Pain    HPI Kara Gentry is a 46 y.o. female.   Patient presenting today with several day history of right shoulder pain that started after cleaning her house vigorously.  States the pain is significantly worse with movement or exertion of the arm and sometimes radiates into the right chest region.  Denies swelling, discoloration, numbness or tingling or weakness into the arm, decreased range of motion, palpitations, shortness of breath, dizziness.  Has been trying over-the-counter pain relievers with no relief.    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   PONV (postoperative nausea and vomiting)    S/P ORIF (open reduction internal fixation) fracture 05/09/17 Ankle  Right     Seasonal allergies    Vitamin D deficiency 05/23/2016   Take 5000 IU vitamin D3 daily    Patient Active Problem List   Diagnosis Date Noted   Migraine 09/29/2021   Hx of migraines 07/30/2019   Pelvic pain in female 12/19/2016   Menorrhagia with regular cycle 09/04/2016   Fibroids 05/31/2016   Vitamin D deficiency 05/23/2016   Pelvic pain 05/21/2016    Past Surgical History:  Procedure Laterality Date   APPLICATION OF WOUND VAC  01/03/2012   Procedure: APPLICATION OF WOUND VAC;  Surgeon: Jonnie Kind, MD;  Location: [facility];  Service: Gynecology;  Laterality: N/A;   APPLICATION OF WOUND VAC  01/05/2012   Procedure: APPLICATION OF WOUND VAC;  Surgeon: Jonnie Kind, MD;  Location: [facility];  Service: Gynecology;  Laterality: N/A;   APPLICATION OF WOUND VAC  01/07/2012   Procedure:  APPLICATION OF WOUND VAC;  Surgeon: Jonnie Kind, MD;  Location: [facility];  Service: Gynecology;;   APPLICATION OF WOUND VAC  01/11/2012   Procedure: APPLICATION OF WOUND VAC;  Surgeon: Jonnie Kind, MD;  Location: [facility];  Service: Gynecology;  Laterality: N/A;  Fasicial Closure   CESAREAN SECTION  12/22/2011   Procedure: CESAREAN SECTION;  Surgeon: Donnamae Jude, MD;  Location: [facility] [facility];  Service: Obstetrics;  Laterality: N/A;  Primary cesarean section with delivery of Baby A girl at 65. Baby B girl at 98.   CESAREAN SECTION N/A    Phreesia 07/26/2019   DILITATION & CURRETTAGE/HYSTROSCOPY WITH NOVASURE ABLATION N/A 09/04/2016   Procedure: HYSTEROSCOPY WITH NOVASURE ABLATION;  Surgeon: Jonnie Kind, MD;  Location: [facility];  Service: Gynecology;  Laterality: N/A;   ECTOPIC PREGNANCY SURGERY     FRACTURE SURGERY N/A    Phreesia 07/26/2019   INCISION AND DRAINAGE OF WOUND  01/07/2012   Procedure: IRRIGATION AND DEBRIDEMENT WOUND;  Surgeon: Jonnie Kind, MD;  Location: [facility];  Service: Gynecology;;   INSERTION OF MESH  01/03/2012   Procedure: INSERTION OF MESH;  Surgeon: Jonnie Kind, MD;  Location: [facility];  Service: Gynecology;  Laterality: N/A;   MANDIBLE SURGERY Right cancer   Cancerous mass   NASAL SINUS SURGERY     ORIF ANKLE FRACTURE Right 05/09/2017   Procedure:  OPEN REDUCTION INTERNAL FIXATION (ORIF) ANKLE FRACTURE;  Surgeon: Carole Civil, MD;  Location: [facility];  Service: Orthopedics;  Laterality: Right;   SECONDARY CLOSURE OF WOUND  01/22/2012   Procedure: SECONDARY CLOSURE OF WOUND;  Surgeon: Jonnie Kind, MD;  [facility]: [facility];  Service: Gynecology;  Laterality: N/A;   WOUND EXPLORATION  01/03/2012   Necrotizing fascitis at C section incision   WOUND EXPLORATION  01/05/2012   Procedure: WOUND EXPLORATION;  Surgeon: Jonnie Kind, MD;  [facility]: [facility];  Service: Gynecology;  Laterality: N/A;  Re-Exploration of Wound Abscess with Debridement   WOUND  EXPLORATION  01/07/2012   Procedure: WOUND EXPLORATION;  Surgeon: Jonnie Kind, MD;  [facility]: [facility];  Service: Gynecology;;   WOUND EXPLORATION  01/11/2012   Procedure: WOUND EXPLORATION;  Surgeon: Jonnie Kind, MD;  [facility]: [facility];  Service: Gynecology;  Laterality: N/A;  Debridment of Wound Abscess   WOUND EXPLORATION  01/22/2012   Procedure: WOUND EXPLORATION;  Surgeon: Jonnie Kind, MD;  Location: [facility];  Service: Gynecology;  Laterality: N/A;  Delayed Secondary Wound Closure   WRIST SURGERY Left    Cyst removal    OB History     Gravida  3   Para  1   Term  0   Preterm  1   AB  2   Living  0      SAB  2   IAB      Ectopic      Multiple  1   Live Births  2            Home Medications    Prior to Admission medications   Medication Sig Start Date End Date Taking? Authorizing Provider  cyclobenzaprine (FLEXERIL) 10 MG tablet Take 1 tablet (10 mg total) by mouth 3 (three) times daily as needed for muscle spasms. Do not drink alcohol or drive while taking this medication.  May cause drowsiness. 09/11/21  Yes Volney American, PA-C  naproxen (NAPROSYN) 500 MG tablet Take 1 tablet (500 mg total) by mouth 2 (two) times daily as needed. Patient not taking: Reported on 09/29/2021 09/11/21  Yes Volney American, PA-C  aspirin-acetaminophen-caffeine (EXCEDRIN MIGRAINE) [phone] MG tablet Take 1-2 tablets by mouth daily as needed for headache.     [provider]  cyclobenzaprine (FLEXERIL) 5 MG tablet Take 1 tablet (5 mg total) by mouth 3 (three) times daily as needed for muscle spasms. Patient not taking: Reported on 05/23/2020 04/06/20   Alycia Rossetti, MD  guaiFENesin (MUCINEX) 600 MG 12 hr tablet Take 1 tablet (600 mg total) by mouth 2 (two) times daily as needed for cough or to loosen phlegm. Patient not taking: Reported on 09/29/2021 05/26/20   Eulogio Bear, NP  Multiple Vitamins-Minerals (ONE-A-DAY WOMENS PO) Take by mouth.     [provider]  naproxen sodium (ANAPROX DS) 550 MG tablet Take 1 tablet (550 mg total) by mouth 2 (two) times daily with a meal. Patient not taking: Reported on 05/23/2020 12/17/19   Florian Buff, MD  sodium chloride (OCEAN) 0.65 % SOLN nasal spray Place 1 spray into both nostrils as needed for congestion. Patient not taking: Reported on 09/29/2021 03/24/20   Eulogio Bear, NP  topiramate (TOPAMAX) 25 MG tablet Take 1/2 tablet daily for one week Take 1 tablet daily orally starting week two. 09/29/21   Alvira Monday, FNP  VITAMIN D PO Take by mouth.    [provider]    Family History Family History  Problem Relation Age of Onset   Cancer Maternal Grandfather        prostate   Diabetes Mother    Arthritis Mother    Cancer Paternal Uncle    Cancer Maternal Uncle        lung   Diabetes Maternal Aunt    Arthritis Maternal Aunt     Social History Social History   Tobacco Use   Smoking status: Never   Smokeless tobacco: Never  Vaping Use   Vaping Use: Never used  Substance Use Topics   Alcohol use: No   Drug use: No     Allergies   Patient has no known allergies.   Review of Systems Review of Systems Per HPI  Physical Exam Triage Vital Signs ED Triage Vitals  Enc Vitals Group     BP 09/11/21 1949 114/80     Pulse Rate 09/11/21 1949 64     Resp 09/11/21 1949 20     Temp 09/11/21 1949 98.3 F (36.8 C)     Temp src --      SpO2 09/11/21 1949 98 %     Weight --      Height --      Head Circumference --      Peak Flow --      Pain Score 09/11/21 1945 4     Pain Loc --      Pain Edu? --      Excl. in [place]? --    No data found.  Updated Vital Signs BP 114/80   Pulse 64   Temp 98.3 F (36.8 C)   Resp 20   SpO2 98%   Visual Acuity Right Eye Distance:   Left Eye Distance:   Bilateral Distance:    Right Eye Near:   Left Eye Near:    Bilateral Near:     Physical Exam Vitals and nursing note reviewed.  Constitutional:       Appearance: Normal appearance. She is not ill-appearing.  HENT:     Head: Atraumatic.  Eyes:     Extraocular Movements: Extraocular movements intact.     Conjunctiva/sclera: Conjunctivae normal.  Cardiovascular:     Rate and Rhythm: Normal rate and regular rhythm.     Heart sounds: Normal heart sounds.  Pulmonary:     Effort: Pulmonary effort is normal.     Breath sounds: Normal breath sounds.  Musculoskeletal:        General: Tenderness present. No swelling. Normal range of motion.     Cervical back: Normal range of motion and neck supple.     Comments: Localized tenderness to palpation right shoulder extending to right pectoralis muscle.  Pain reproducible on palpation and range of motion against resistance exercises.  Grip strength full and equal bilateral hands.  Range of motion of the right shoulder fully intact  Skin:    General: Skin is warm and dry.     Findings: No bruising or erythema.  Neurological:     Mental Status: She is alert and oriented to person, place, and time.     Comments: Right upper extremity neurovascularly intact  Psychiatric:        Mood and Affect: Mood normal.        Thought Content: Thought content normal.        Judgment: Judgment normal.      UC Treatments / Results  Labs (all labs ordered are listed, but  only abnormal results are displayed) Labs Reviewed - No data to display  EKG   Radiology No results found.  Procedures Procedures (including critical care time)  Medications Ordered in UC Medications - No data to display  Initial Impression / Assessment and Plan / UC Course  I have reviewed the triage vital signs and the nursing notes.  Pertinent labs & imaging results that were available during my care of the patient were reviewed by me and considered in my medical decision making (see chart for details).     Suspect pectoral strain, treat with Flexeril, naproxen, heat, muscle rubs, rest.  Return for worsening symptoms.  Final  Clinical Impressions(s) / UC Diagnoses   Final diagnoses:  Pectoralis muscle strain, initial encounter   Discharge Instructions   None    ED Prescriptions     Medication Sig Dispense Auth. Provider   cyclobenzaprine (FLEXERIL) 10 MG tablet Take 1 tablet (10 mg total) by mouth 3 (three) times daily as needed for muscle spasms. Do not drink alcohol or drive while taking this medication.  May cause drowsiness. 15 tablet Volney American, [place]   naproxen (NAPROSYN) 500 MG tablet Take 1 tablet (500 mg total) by mouth 2 (two) times daily as needed. Patient not taking:  Reported on 09/29/2021 30 tablet Volney American, [place]      PDMP not reviewed this encounter.   Merrie Roof [place], [place] 10/19/21 [phone]

## 2021-11-23 ENCOUNTER — Encounter: Payer: Self-pay | Admitting: Family Medicine

## 2021-11-24 NOTE — Telephone Encounter (Signed)
Please inform the patient to schedule a televisit or come in the office to discuss weight loss management options.

## 2021-12-18 ENCOUNTER — Ambulatory Visit
Admission: EM | Admit: 2021-12-18 | Discharge: 2021-12-18 | Disposition: A | Discharge status: Home / Self Care | Payer: BC Managed Care – PPO | Attending: Family Medicine | Admitting: Family Medicine

## 2021-12-18 DIAGNOSIS — J069 Acute upper respiratory infection, unspecified: Secondary | ICD-10-CM | POA: Diagnosis present

## 2021-12-18 DIAGNOSIS — Z1152 Encounter for screening for COVID-19: Secondary | ICD-10-CM | POA: Diagnosis not present

## 2021-12-18 DIAGNOSIS — R509 Fever, unspecified: Secondary | ICD-10-CM | POA: Diagnosis present

## 2021-12-18 DIAGNOSIS — R52 Pain, unspecified: Secondary | ICD-10-CM | POA: Diagnosis present

## 2021-12-18 LAB — RESP PANEL BY RT-PCR (FLU A&B, COVID) ARPGX2
Influenza A by PCR: NEGATIVE
Influenza B by PCR: NEGATIVE
SARS Coronavirus 2 by RT PCR: NEGATIVE

## 2021-12-18 LAB — POCT RAPID STREP A (OFFICE): Rapid Strep A Screen: NEGATIVE

## 2021-12-18 NOTE — ED Provider Notes (Signed)
[facility]    CSN: [id number] Arrival date & time: 12/18/21  1600      History   Chief Complaint No chief complaint on file.   HPI Kara Gentry is a 46 y.o. female.   Patient presenting today with 1 to 2-day history of generalized body aches, fever, sore throat, nasal congestion.  Denies cough, chest pain, shortness of breath, abdominal pain, nausea vomiting or diarrhea.  So far taking over-the-counter pain and fever reducers, NyQuil with mild relief temporarily of symptoms.  No known sick contacts recently.  No known pertinent chronic medical problems.    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   PONV (postoperative nausea and vomiting)    S/P ORIF (open reduction internal fixation) fracture 05/09/17 Ankle  Right     Seasonal allergies    Vitamin D deficiency 05/23/2016   Take 5000 IU vitamin D3 daily    Patient Active Problem List   Diagnosis Date Noted   Migraine 09/29/2021   Hx of migraines 07/30/2019   Pelvic pain in female 12/19/2016   Menorrhagia with regular cycle 09/04/2016   Fibroids 05/31/2016   Vitamin D deficiency 05/23/2016   Pelvic pain 05/21/2016    Past Surgical History:  Procedure Laterality Date   APPLICATION OF WOUND VAC  01/03/2012   Procedure: APPLICATION OF WOUND VAC;  Surgeon: Jonnie Kind, MD;  Location: [facility];  Service: Gynecology;  Laterality: N/A;   APPLICATION OF WOUND VAC  01/05/2012   Procedure: APPLICATION OF WOUND VAC;  Surgeon: Jonnie Kind, MD;  Location: [facility];  Service: Gynecology;  Laterality: N/A;   APPLICATION OF WOUND VAC  01/07/2012   Procedure: APPLICATION OF WOUND VAC;  Surgeon: Jonnie Kind, MD;  Location: [facility];  Service: Gynecology;;    APPLICATION OF WOUND VAC  01/11/2012   Procedure: APPLICATION OF WOUND VAC;  Surgeon: Jonnie Kind, MD;  Location: [facility];  Service: Gynecology;  Laterality: N/A;  Fasicial Closure   CESAREAN SECTION  12/22/2011   Procedure: CESAREAN SECTION;  Surgeon: Donnamae Jude, MD;  Location: [facility];  Service: Obstetrics;  Laterality: N/A;  Primary cesarean section with delivery of Baby A girl at 69. Baby B girl at 29.   CESAREAN SECTION N/A    Phreesia 07/26/2019   DILITATION & CURRETTAGE/HYSTROSCOPY WITH NOVASURE ABLATION N/A 09/04/2016   Procedure: HYSTEROSCOPY WITH NOVASURE ABLATION;  Surgeon: Jonnie Kind, MD;  Location: [facility];  Service: Gynecology;  Laterality: N/A;   ECTOPIC PREGNANCY SURGERY     FRACTURE SURGERY N/A    Phreesia 07/26/2019   INCISION AND DRAINAGE OF WOUND  01/07/2012   Procedure: IRRIGATION AND DEBRIDEMENT WOUND;  Surgeon: Jonnie Kind, MD;  Location: [facility];  Service: Gynecology;;   INSERTION OF MESH  01/03/2012   Procedure: INSERTION OF MESH;  Surgeon: Jonnie Kind, MD;  Location: [facility];  Service: Gynecology;  Laterality: N/A;   MANDIBLE SURGERY Right cancer   Cancerous mass   NASAL SINUS SURGERY     ORIF ANKLE FRACTURE Right 05/09/2017   Procedure: OPEN REDUCTION INTERNAL FIXATION (ORIF) ANKLE FRACTURE;  Surgeon: Carole Civil, MD;  Location: [facility];  Service: Orthopedics;  Laterality: Right;   SECONDARY CLOSURE OF WOUND  01/22/2012   Procedure: SECONDARY CLOSURE OF WOUND;  Surgeon: Jonnie Kind, MD;  Location: [facility];  Service: Gynecology;  Laterality: N/A;   WOUND EXPLORATION  01/03/2012   Necrotizing fascitis at C section incision   WOUND EXPLORATION  01/05/2012   Procedure: WOUND EXPLORATION;  Surgeon: Jonnie Kind, MD;  Location: [facility];  Service: Gynecology;  Laterality: N/A;  Re-Exploration of Wound Abscess with Debridement   WOUND EXPLORATION  01/07/2012   Procedure: WOUND EXPLORATION;  Surgeon: Jonnie Kind, MD;  Location: [facility];   Service: Gynecology;;   WOUND EXPLORATION  01/11/2012   Procedure: WOUND EXPLORATION;  Surgeon: Jonnie Kind, MD;  Location: [facility];  Service: Gynecology;  Laterality: N/A;  Debridment of Wound Abscess   WOUND EXPLORATION  01/22/2012   Procedure: WOUND EXPLORATION;  Surgeon: Jonnie Kind, MD;  Location: [facility];  Service: Gynecology;  Laterality: N/A;  Delayed Secondary Wound Closure   WRIST SURGERY Left    Cyst removal    OB History     Gravida  3   Para  1   Term  0   Preterm  1   AB  2   Living  0      SAB  2   IAB      Ectopic      Multiple  1   Live Births  2            Home Medications    Prior to Admission medications   Medication Sig Start Date End Date Taking? Authorizing Provider  aspirin-acetaminophen-caffeine (EXCEDRIN MIGRAINE) [phone] MG tablet Take 1-2 tablets by mouth daily as needed for headache.     [provider]  cyclobenzaprine (FLEXERIL) 10 MG tablet Take 1 tablet (10 mg total) by mouth 3 (three) times daily as needed for muscle spasms. Do not drink alcohol or drive while taking this medication.  May cause drowsiness. 09/11/21   Volney American, PA-C  cyclobenzaprine (FLEXERIL) 5 MG tablet Take 1 tablet (5 mg total) by mouth 3 (three) times daily as needed for muscle spasms. Patient not taking: Reported on 05/23/2020 04/06/20   Alycia Rossetti, MD  guaiFENesin (MUCINEX) 600 MG 12 hr tablet Take 1 tablet (600 mg total) by mouth 2 (two) times daily as needed for cough or to loosen phlegm. Patient not taking: Reported on 09/29/2021 05/26/20   Eulogio Bear, NP  Multiple Vitamins-Minerals (ONE-A-DAY WOMENS PO) Take by mouth.    [provider]  naproxen (NAPROSYN) 500 MG tablet Take 1 tablet (500 mg total) by mouth 2 (two) times daily as needed. Patient not taking: Reported on 09/29/2021 09/11/21   Volney American, PA-C  naproxen sodium (ANAPROX DS) 550 MG tablet Take 1 tablet (550 mg total) by mouth 2  (two) times daily with a meal. Patient not taking: Reported on 05/23/2020 12/17/19   Florian Buff, MD  sodium chloride (OCEAN) 0.65 % SOLN nasal spray Place 1 spray into both nostrils as needed for congestion. Patient not taking: Reported on 09/29/2021 03/24/20   Eulogio Bear, NP  topiramate (TOPAMAX) 25 MG tablet Take 1/2 tablet daily for one week Take 1 tablet daily orally starting week two. 09/29/21   Alvira Monday, FNP  VITAMIN D PO Take by mouth.    [provider]    Family History Family History  Problem Relation Age of Onset   Cancer  Maternal Grandfather        prostate   Diabetes Mother    Arthritis Mother    Cancer Paternal Uncle    Cancer Maternal Uncle        lung   Diabetes Maternal Aunt    Arthritis Maternal Aunt     Social History Social History   Tobacco Use   Smoking status: Never   Smokeless tobacco: Never  Vaping Use   Vaping Use: Never used  Substance Use Topics   Alcohol use: No   Drug use: No     Allergies   Patient has no known allergies.   Review of Systems Review of Systems Per HPI  Physical Exam Triage Vital Signs ED Triage Vitals  Enc Vitals Group     BP 12/18/21 1608 126/86     Pulse Rate 12/18/21 1608 89     Resp 12/18/21 1608 18     Temp 12/18/21 1608 100.3 F (37.9 C)     Temp Source 12/18/21 1608 Oral     SpO2 12/18/21 1608 96 %     Weight --      Height --      Head Circumference --      Peak Flow --      Pain Score 12/18/21 1610 8     Pain Loc --      Pain Edu? --      Excl. in [place]? --    No data found.  Updated Vital Signs BP 126/86 (BP Location: Right Arm)   Pulse 89   Temp 100.3 F (37.9 C) (Oral)   Resp 18   LMP 12/16/2021 (Exact Date)   SpO2 96%   Visual Acuity Right Eye Distance:   Left Eye Distance:   Bilateral Distance:    Right Eye Near:   Left Eye Near:    Bilateral Near:     Physical Exam Vitals and nursing note reviewed.  Constitutional:      Appearance: Normal appearance.   HENT:     Head: Atraumatic.     Right Ear: Tympanic membrane and external ear normal.     Left Ear: Tympanic membrane and external ear normal.     Nose: Rhinorrhea present.     Mouth/Throat:     Mouth: Mucous membranes are moist.     Pharynx: No oropharyngeal exudate or posterior oropharyngeal erythema.  Eyes:     Extraocular Movements: Extraocular movements intact.     Conjunctiva/sclera: Conjunctivae normal.  Cardiovascular:     Rate and Rhythm: Normal rate and regular rhythm.     Heart sounds: Normal heart sounds.  Pulmonary:     Effort: Pulmonary effort is normal.     Breath sounds: Normal breath sounds. No wheezing.  Musculoskeletal:        General: Normal range of motion.     Cervical back: Normal range of motion and neck supple.  Skin:    General: Skin is warm and dry.  Neurological:     Mental Status: She is alert and oriented to person, place, and time.  Psychiatric:        Mood and Affect: Mood normal.        Thought Content: Thought content normal.      UC Treatments / Results  Labs (all labs ordered are listed, but only abnormal results are displayed) Labs Reviewed  RESP PANEL BY RT-PCR (FLU A&B, COVID) [id number]  POCT RAPID STREP A (OFFICE)    EKG   Radiology No  results found.  Procedures Procedures (including critical care time)  Medications Ordered in UC Medications - No data to display  Initial Impression / Assessment and Plan / UC Course  I have reviewed the triage vital signs and the nursing notes.  Pertinent labs & imaging results that were available during my care of the patient were reviewed by me and considered in my medical decision making (see chart for details).     Rapid strep negative, respiratory panel pending.  Febrile in triage, otherwise vital signs reassuring.  Suspect viral upper respiratory infection, good candidate for antiviral therapy if positive for either COVID or flu.  Discussed supportive over-the-counter medications  and home care.  Return for any worsening symptoms.  Final Clinical Impressions(s) / UC Diagnoses   Final diagnoses:  Viral URI  Fever, unspecified  Generalized body aches   Discharge Instructions   None    ED Prescriptions   None    PDMP not reviewed this encounter.   Volney American, [place] 12/18/21 1655

## 2021-12-18 NOTE — ED Triage Notes (Signed)
Pt rports feeling bad yesterday with shoulder aches, low back aches, whole body aches scratchy throat, headache. Took day and night quil which provides slight relief.

## 2022-01-01 ENCOUNTER — Ambulatory Visit: Payer: BC Managed Care – PPO | Admitting: Family Medicine

## 2022-01-01 ENCOUNTER — Encounter: Payer: Self-pay | Admitting: Family Medicine

## 2022-01-01 VITALS — BP 132/83 | HR 85 | Ht 62.0 in | Wt 201.1 lb

## 2022-01-01 DIAGNOSIS — E038 Other specified hypothyroidism: Secondary | ICD-10-CM | POA: Diagnosis not present

## 2022-01-01 DIAGNOSIS — Z23 Encounter for immunization: Secondary | ICD-10-CM

## 2022-01-01 DIAGNOSIS — E559 Vitamin D deficiency, unspecified: Secondary | ICD-10-CM | POA: Diagnosis not present

## 2022-01-01 DIAGNOSIS — R7301 Impaired fasting glucose: Secondary | ICD-10-CM

## 2022-01-01 DIAGNOSIS — E669 Obesity, unspecified: Secondary | ICD-10-CM

## 2022-01-01 DIAGNOSIS — G43009 Migraine without aura, not intractable, without status migrainosus: Secondary | ICD-10-CM | POA: Diagnosis not present

## 2022-01-01 DIAGNOSIS — E7849 Other hyperlipidemia: Secondary | ICD-10-CM

## 2022-01-01 DIAGNOSIS — E66812 Obesity, class 2: Secondary | ICD-10-CM | POA: Insufficient documentation

## 2022-01-01 NOTE — Assessment & Plan Note (Signed)
controlled She reports having less frequent migraine headaches

## 2022-01-01 NOTE — Progress Notes (Signed)
Established Patient Office Visit  Subjective:  Patient ID: Kara Gentry, female    DOB: 1975-03-06  Age: 46 y.o. MRN: [id number]  CC:  Chief Complaint  Patient presents with   Follow-up    3 month f/u, would like to discuss weight, has been trying on her own and when she gets to a certain weight she is unable to go down from there.    HPI Kara Gentry is a 46 y.o. female with past medical history of migraine headaches, and obesity presents for f/u of  chronic medical conditions.  Migraine headaches: She reports having less frequent migraine headaches, noting that she has not taken Topamax 25 mg for about 2 to 3 months.  Obesity: She reports that she has been trying to lose weight and plateau at a certain weight.  She reports that she has been implementing a heart healthy diet with minimal physical activities.  She would like to discuss her options for weight loss today.  She received her Tdap and flu vaccine today.  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   PONV (postoperative nausea and vomiting)    S/P ORIF (open reduction internal fixation) fracture 05/09/17 Ankle  Right     Seasonal allergies    Vitamin D deficiency 05/23/2016   Take 5000 IU vitamin D3 daily    Past Surgical History:  Procedure Laterality Date   APPLICATION OF WOUND VAC  01/03/2012   Procedure: APPLICATION OF WOUND VAC;  Surgeon: Jonnie Kind, MD;  Location: [facility];  Service: Gynecology;  Laterality: N/A;   APPLICATION OF WOUND VAC  01/05/2012   Procedure: APPLICATION OF WOUND VAC;  Surgeon: Jonnie Kind, MD;  Location: [facility];  Service: Gynecology;  Laterality: N/A;   APPLICATION OF WOUND VAC  01/07/2012   Procedure: APPLICATION OF WOUND VAC;   Surgeon: Jonnie Kind, MD;  Location: [facility];  Service: Gynecology;;   APPLICATION OF WOUND VAC  01/11/2012   Procedure: APPLICATION OF WOUND VAC;  Surgeon: Jonnie Kind, MD;  Location: [facility];  Service: Gynecology;  Laterality: N/A;  Fasicial Closure   CESAREAN SECTION  12/22/2011   Procedure: CESAREAN SECTION;  Surgeon: Donnamae Jude, MD;  Location: [facility];  Service: Obstetrics;  Laterality: N/A;  Primary cesarean section with delivery of Baby A girl at 75. Baby B girl at 55.   CESAREAN SECTION N/A    [employer]rees[employer] 07/26/2019   DILITATION & CURRETTAGE/HYSTROSCOPY WITH NOVASURE ABLATION N/A 09/04/2016   Procedure: HYSTEROSCOPY WITH NOVASURE ABLATION;  Surgeon: Jonnie Kind, MD;  Location: [facility];  Service: Gynecology;  Laterality: N/A;   ECTOPIC PREGNANCY SURGERY     FRACTURE SURGERY N/A    [employer]rees[employer] 07/26/2019   INCISION AND DRAINAGE OF WOUND  01/07/2012   Procedure: IRRIGATION AND DEBRIDEMENT WOUND;  Surgeon: Jonnie Kind, MD;  Location: [facility];  Service: Gynecology;;   INSERTION OF MESH  01/03/2012   Procedure: INSERTION OF MESH;  Surgeon: Jonnie Kind, MD;  Location: [facility];  Service: Gynecology;  Laterality: N/A;   MANDIBLE SURGERY Right cancer   Cancerous mass   NASAL SINUS SURGERY     ORIF ANKLE  FRACTURE Right 05/09/2017   Procedure: OPEN REDUCTION INTERNAL FIXATION (ORIF) ANKLE FRACTURE;  Surgeon: Carole Civil, MD;  Location: [facility];  Service: Orthopedics;  Laterality: Right;   SECONDARY CLOSURE OF WOUND  01/22/2012   Procedure: SECONDARY CLOSURE OF WOUND;  Surgeon: Jonnie Kind, MD;  Location: [facility];  Service: Gynecology;  Laterality: N/A;   WOUND EXPLORATION  01/03/2012   Necrotizing fascitis at C section incision   WOUND EXPLORATION  01/05/2012   Procedure: WOUND EXPLORATION;  Surgeon: Jonnie Kind, MD;  Location: [facility];  Service: Gynecology;  Laterality: N/A;  Re-Exploration of Wound Abscess with Debridement   WOUND EXPLORATION  01/07/2012    Procedure: WOUND EXPLORATION;  Surgeon: Jonnie Kind, MD;  Location: [facility];  Service: Gynecology;;   WOUND EXPLORATION  01/11/2012   Procedure: WOUND EXPLORATION;  Surgeon: Jonnie Kind, MD;  Location: [facility];  Service: Gynecology;  Laterality: N/A;  Debridment of Wound Abscess   WOUND EXPLORATION  01/22/2012   Procedure: WOUND EXPLORATION;  Surgeon: Jonnie Kind, MD;  Location: [facility];  Service: Gynecology;  Laterality: N/A;  Delayed Secondary Wound Closure   WRIST SURGERY Left    Cyst removal    Family History  Problem Relation Age of Onset   Cancer Maternal Grandfather        prostate   Diabetes Mother    Arthritis Mother    Cancer Paternal Uncle    Cancer Maternal Uncle        lung   Diabetes Maternal Aunt    Arthritis Maternal Aunt     Social History   Socioeconomic History   Marital status: Married    Spouse name: Not on file   Number of children: Not on file   Years of education: masters   Highest education level: Not on file  Occupational History   Occupation: Product manager: [employer]  Tobacco Use   Smoking status: Never   Smokeless tobacco: Never  Vaping Use   Vaping Use: Never used  Substance and Sexual Activity   Alcohol use: No   Drug use: No   Sexual activity: Yes    Birth control/protection: Surgical    Comment: ablation  Other Topics Concern   Not on file  Social History Narrative   Not on file   Social Determinants of Health   Financial Resource Strain: Not on file  Food Insecurity: Not on file  Transportation Needs: Not on file  Physical Activity: Not on file  Stress: Not on file  Social Connections: Not on file  Intimate Partner Violence: Not on file    Outpatient Medications Prior to Visit  Medication Sig Dispense Refill   VITAMIN D PO Take by mouth.     aspirin-acetaminophen-caffeine (EXCEDRIN MIGRAINE) 250-250-65 MG tablet Take 1-2 tablets by mouth daily as needed for headache.  (Patient not taking: Reported  on 01/01/2022)     cyclobenzaprine (FLEXERIL) 10 MG tablet Take 1 tablet (10 mg total) by mouth 3 (three) times daily as needed for muscle spasms. Do not drink alcohol or drive while taking this medication.  May cause drowsiness. (Patient not taking: Reported on 01/01/2022) 15 tablet 0   cyclobenzaprine (FLEXERIL) 5 MG tablet Take 1 tablet (5 mg total) by mouth 3 (three) times daily as needed for muscle spasms. (Patient not taking: Reported on 01/01/2022) 20 tablet 0   guaiFENesin (MUCINEX) 600 MG 12 hr tablet Take 1 tablet (600 mg total) by mouth 2 (  two) times daily as needed for cough or to loosen phlegm. (Patient not taking: Reported on 01/01/2022) 30 tablet 0   Multiple Vitamins-Minerals (ONE-A-DAY WOMENS PO) Take by mouth. (Patient not taking: Reported on 01/01/2022)     naproxen (NAPROSYN) 500 MG tablet Take 1 tablet (500 mg total) by mouth 2 (two) times daily as needed. (Patient not taking: Reported on 01/01/2022) 30 tablet 0   naproxen sodium (ANAPROX DS) 550 MG tablet Take 1 tablet (550 mg total) by mouth 2 (two) times daily with a meal. (Patient not taking: Reported on 01/01/2022) 60 tablet 1   sodium chloride (OCEAN) 0.65 % SOLN nasal spray Place 1 spray into both nostrils as needed for congestion. (Patient not taking: Reported on 01/01/2022) 88 mL 0   topiramate (TOPAMAX) 25 MG tablet Take 1/2 tablet daily for one week Take 1 tablet daily orally starting week two. (Patient not taking: Reported on 01/01/2022) 30 tablet 0   No facility-administered medications prior to visit.    No Known Allergies  ROS Review of Systems  Constitutional:  Negative for fatigue and fever.  Eyes:  Negative for photophobia and visual disturbance.  Respiratory:  Negative for chest tightness and shortness of breath.   Cardiovascular:  Negative for chest pain and palpitations.  Neurological:  Negative for dizziness and headaches.      Objective:    Physical Exam Constitutional:      Appearance: She is  obese.  HENT:     Head: Normocephalic.  Cardiovascular:     Rate and Rhythm: Normal rate and regular rhythm.     Pulses: Normal pulses.     Heart sounds: Normal heart sounds.  Pulmonary:     Effort: Pulmonary effort is normal.     Breath sounds: Normal breath sounds.  Abdominal:     Palpations: Abdomen is soft.  Skin:    Findings: No lesion.  Neurological:     Mental Status: She is alert.     BP 132/83   Pulse 85   Ht _0  (1.575 m)   Wt 201 lb 1.9 oz (91.2 kg)   LMP 12/16/2021 (Exact Date)   SpO2 96%   BMI 36.79 kg/m  Wt Readings from Last 3 Encounters:  01/01/22 201 lb 1.9 oz (91.2 kg)  09/29/21 196 lb 0.6 oz (88.9 kg)  05/23/20 192 lb 6 oz (87.3 kg)    Lab Results  Component Value Date   TSH 0.857 10/02/2021   Lab Results  Component Value Date   WBC 4.8 10/02/2021   HGB 12.3 10/02/2021   HCT 38.4 10/02/2021   MCV 84 10/02/2021   PLT 294 10/02/2021   Lab Results  Component Value Date   NA 140 10/02/2021   K 4.0 10/02/2021   CO2 20 10/02/2021   GLUCOSE 99 10/02/2021   BUN 19 10/02/2021   CREATININE 0.62 10/02/2021   BILITOT 0.5 10/02/2021   ALKPHOS 54 10/02/2021   AST 13 10/02/2021   ALT 12 10/02/2021   PROT 6.8 10/02/2021   ALBUMIN 4.2 10/02/2021   CALCIUM 9.0 10/02/2021   ANIONGAP 11 05/08/2017   EGFR 111 10/02/2021   Lab Results  Component Value Date   CHOL 185 10/02/2021   Lab Results  Component Value Date   HDL 52 10/02/2021   Lab Results  Component Value Date   LDLCALC 125 (H) 10/02/2021   Lab Results  Component Value Date   TRIG 39 10/02/2021   Lab Results  Component Value Date  CHOLHDL 3.6 10/02/2021   Lab Results  Component Value Date   HGBA1C 5.8 (H) 10/02/2021      Assessment & Plan:   Problem List Items Addressed This Visit       Cardiovascular and Mediastinum   Migraine - Primary    controlled She reports having less frequent migraine headaches        Other   Vitamin D deficiency   Relevant Orders    VITAMIN D 25 Hydroxy (Vit-D Deficiency, Fractures)   Obesity, Class II, BMI 35-39.9, 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    Discussed weight loss options, including oral and subcutaneous therapy Discussed implications and side effects of Wegovy Discussed implications side effects of oral obesity medication Patient would like to increase her physical activities and implement lifestyle changes to achieve a healthy BMI      Other Visit Diagnoses     Flu vaccine need       Relevant Orders   Flu Vaccine QUAD 6+ mos PF IM (Fluarix Quad PF) (Completed)   IFG (impaired fasting glucose)       Relevant Orders   Hemoglobin A1c   Other specified hypothyroidism       Relevant Orders   TSH + free T4   Other hyperlipidemia       Relevant Orders   Lipid panel   CMP14+EGFR   CBC with Differential/Platelet   Immunization due       Relevant Orders   Tdap vaccine greater than or equal to 7yo IM (Completed)       No orders of the defined types were placed in this encounter.   Follow-up: Return in about 3 months (around 04/03/2022) for CPE.    Alvira Monday, FNP

## 2022-01-01 NOTE — Patient Instructions (Signed)
I appreciate the opportunity to provide care to you today!    Follow up:  3 months CPE  Labs: please stop by the lab during the week to get your blood drawn (CBC, CMP, TSH, Lipid profile, HgA1c, Vit D)     Please continue to a heart-healthy diet and increase your physical activities. Try to exercise for 30mns at least three times a week.      It was a pleasure to see you and I look forward to continuing to work together on your health and well-being. Please do not hesitate to call the office if you need care or have questions about your care.   Have a wonderful day and week. With Gratitude, GAlvira MondayMSN, FNP-BC

## 2022-01-01 NOTE — Assessment & Plan Note (Signed)
Discussed weight loss options, including oral and subcutaneous therapy Discussed implications and side effects of Wegovy Discussed implications side effects of oral obesity medication Patient would like to increase her physical activities and implement lifestyle changes to achieve a healthy BMI

## 2022-01-03 LAB — CMP14+EGFR
ALT: 14 IU/L (ref 0–32)
AST: 13 IU/L (ref 0–40)
Albumin/Globulin Ratio: 1.5 (ref 1.2–2.2)
Albumin: 4.4 g/dL (ref 3.9–4.9)
Alkaline Phosphatase: 61 IU/L (ref 44–121)
BUN/Creatinine Ratio: 20 (ref 9–23)
BUN: 14 mg/dL (ref 6–24)
Bilirubin Total: 0.8 mg/dL (ref 0.0–1.2)
CO2: 20 mmol/L (ref 20–29)
Calcium: 9.4 mg/dL (ref 8.7–10.2)
Chloride: 102 mmol/L (ref 96–106)
Creatinine, Ser: 0.71 mg/dL (ref 0.57–1.00)
Globulin, Total: 2.9 g/dL (ref 1.5–4.5)
Glucose: 92 mg/dL (ref 70–99)
Potassium: 4.5 mmol/L (ref 3.5–5.2)
Sodium: 138 mmol/L (ref 134–144)
Total Protein: 7.3 g/dL (ref 6.0–8.5)
eGFR: 106 mL/min/{1.73_m2} (ref >59)

## 2022-01-03 LAB — CBC WITH DIFFERENTIAL/PLATELET
Basophils Absolute: 0 10*3/uL (ref 0.0–0.2)
Basos: 0 %
EOS (ABSOLUTE): 0 10*3/uL (ref 0.0–0.4)
Eos: 0 %
Hematocrit: 39.8 % (ref 34.0–46.6)
Hemoglobin: 12.9 g/dL (ref 11.1–15.9)
Immature Grans (Abs): 0 10*3/uL (ref 0.0–0.1)
Immature Granulocytes: 0 %
Lymphocytes Absolute: 1.3 10*3/uL (ref 0.7–3.1)
Lymphs: 15 %
MCH: 27.4 pg (ref 26.6–33.0)
MCHC: 32.4 g/dL (ref 31.5–35.7)
MCV: 85 fL (ref 79–97)
Monocytes Absolute: 0.5 10*3/uL (ref 0.1–0.9)
Monocytes: 6 %
Neutrophils Absolute: 7.3 10*3/uL — ABNORMAL HIGH (ref 1.4–7.0)
Neutrophils: 79 %
Platelets: 270 10*3/uL (ref 150–450)
RBC: 4.7 x10E6/uL (ref 3.77–5.28)
RDW: 12.9 % (ref 11.7–15.4)
WBC: 9.2 10*3/uL (ref 3.4–10.8)

## 2022-01-03 LAB — HEMOGLOBIN A1C
Est. average glucose Bld gHb Est-mCnc: 128 mg/dL
Hgb A1c MFr Bld: 6.1 % — ABNORMAL HIGH (ref 4.8–5.6)

## 2022-01-03 LAB — VITAMIN D 25 HYDROXY (VIT D DEFICIENCY, FRACTURES): Vit D, 25-Hydroxy: 40.7 ng/mL (ref 30.0–100.0)

## 2022-01-03 LAB — LIPID PANEL
Chol/HDL Ratio: 3.4 ratio (ref 0.0–4.4)
Cholesterol, Total: 192 mg/dL (ref 100–199)
HDL: 57 mg/dL (ref >39)
LDL Chol Calc (NIH): 125 mg/dL — ABNORMAL HIGH (ref 0–99)
Triglycerides: 52 mg/dL (ref 0–149)
VLDL Cholesterol Cal: 10 mg/dL (ref 5–40)

## 2022-01-03 LAB — TSH+FREE T4
Free T4: 1.14 ng/dL (ref 0.82–1.77)
TSH: 0.88 u[IU]/mL (ref 0.450–4.500)

## 2022-01-04 NOTE — Progress Notes (Signed)
Please inform the patient that she is prediabetic.  I recommend decreasing her intake of foods high in sugar.  Also recommend low carbs and fat diet with increased physical activities.  Her LDL is 125 and I want her LDL less than 100.  All other labs are stable.

## 2022-02-05 ENCOUNTER — Ambulatory Visit
Admission: EM | Admit: 2022-02-05 | Discharge: 2022-02-05 | Disposition: A | Discharge status: Home / Self Care | Payer: BC Managed Care – PPO

## 2022-02-07 ENCOUNTER — Telehealth: Payer: BC Managed Care – PPO | Admitting: Internal Medicine

## 2022-02-07 NOTE — Progress Notes (Incomplete)
     This is a telephone encounter between Kara Gentry and Kara Gentry on 02/07/2022 for congestion. The visit was conducted with the patient located at {[id number]::"home"} and Kara Gentry at {NAMES:[id number]::"RPC","Hospital","Home"}. The patient's identity was confirmed using their DOB and current address. The {WHO:[id number]::"patient","his/her legal guardian","***"} has consented to being evaluated through a telephone encounter and understands the associated risks (an examination cannot be done and the patient may need to come in for an appointment) / benefits (allows the patient to remain at home, decreasing exposure to coronavirus).    CC: ***  HPI:Ms.Kara Gentry is a 46 y.o. female who presents for evaluation of ***. For the details of today's visit, please refer to the assessment and plan.   Depression, PHQ-9: Based on the patients  [place] Visit from 01/01/2022 in [facility]  PHQ-9 Total Score 6      score we have ***.  Past Medical History:  Diagnosis Date  . Anemia   . Bursitis    Right shoulder  . Cancer ([place])    soft tissue of left cheeck  . Closed bimalleolar fracture of right ankle 05/27/2017  . Fibroids 05/31/2016  . Headache   . Hemorrhoids   . 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 PONV (postoperative nausea and vomiting)   . S/P ORIF (open reduction internal fixation) fracture 05/09/17 Ankle  Right    . Seasonal allergies   . Vitamin D deficiency 05/23/2016   Take 5000 IU vitamin D3 daily    Review of Systems:    ROS   Physical Exam: There were no vitals filed for this visit.   Physical Exam   Assessment & Plan:   No problem-specific Assessment & Plan notes found for this encounter.   Time:   Today, I have spent *** minutes reviewing the chart, including problem list, medications, and with the patient with telehealth technology discussing the above problems.  Kara Dy, MD

## 2022-02-08 ENCOUNTER — Encounter: Payer: Self-pay | Admitting: Family Medicine

## 2022-04-03 ENCOUNTER — Encounter: Payer: Self-pay | Admitting: Family Medicine

## 2022-04-03 ENCOUNTER — Telehealth: Payer: Self-pay | Admitting: Family Medicine

## 2022-04-03 ENCOUNTER — Ambulatory Visit: Payer: BC Managed Care – PPO | Admitting: Family Medicine

## 2022-04-03 ENCOUNTER — Other Ambulatory Visit: Payer: Self-pay | Admitting: Family Medicine

## 2022-04-03 VITALS — BP 138/82 | HR 68 | Ht 62.0 in | Wt 197.0 lb

## 2022-04-03 DIAGNOSIS — E7849 Other hyperlipidemia: Secondary | ICD-10-CM

## 2022-04-03 DIAGNOSIS — R5383 Other fatigue: Secondary | ICD-10-CM | POA: Insufficient documentation

## 2022-04-03 DIAGNOSIS — R61 Generalized hyperhidrosis: Secondary | ICD-10-CM

## 2022-04-03 DIAGNOSIS — G43009 Migraine without aura, not intractable, without status migrainosus: Secondary | ICD-10-CM | POA: Diagnosis not present

## 2022-04-03 DIAGNOSIS — E669 Obesity, unspecified: Secondary | ICD-10-CM

## 2022-04-03 DIAGNOSIS — E559 Vitamin D deficiency, unspecified: Secondary | ICD-10-CM

## 2022-04-03 DIAGNOSIS — E038 Other specified hypothyroidism: Secondary | ICD-10-CM

## 2022-04-03 DIAGNOSIS — R7303 Prediabetes: Secondary | ICD-10-CM

## 2022-04-03 MED ORDER — PAROXETINE HCL 10 MG PO TABS: 10.0000 mg | ORAL_TABLET | Freq: Every day | ORAL | 1 refills | Status: DC

## 2022-04-03 MED ORDER — PAROXETINE MESYLATE 7.5 MG PO CAPS: 1.0000 | ORAL_CAPSULE | Freq: Every day | ORAL | 1 refills | Status: DC

## 2022-04-03 NOTE — Assessment & Plan Note (Signed)
Reports increased fatigue and increased headaches She takes Excedrin migraines with relief for her symptoms No headache reported today Encouraged to continue taking Excedrin migraines when symptomatic No red flag symptoms reported

## 2022-04-03 NOTE — Assessment & Plan Note (Signed)
Denies systemic symptoms Complains of night sweats nightly Reports regular menstrual cycles Symptoms have been ongoing for few months Will treat with Paxil 7.5 mg daily I recommend avoiding caffeine, alcohol and spicy food close to your bedtime as this will can affect internal temperature and cause you to sweat. I recommend exercising frequently and regularly during the day to prevent excessive sweating at night I recommend keeping a cool temperature in your room

## 2022-04-03 NOTE — Patient Instructions (Addendum)
I appreciate the opportunity to provide care to you today!    Follow up:  3 months  Labs: please stop by the lab during the week to get your blood drawn (CBC, CMP, TSH, Lipid profile, HgA1c, Vit D)   Night sweats Please start taking Paxil 7.5 mg nightly I recommend avoiding  caffeine, alcohol and spicy food close to your bedtime as this will can affect internal temperature and cause you to sweat. I recommend exercising frequently and regularly during the day to prevent excessive sweating at night I recommend keeping a cool temperature in your room  Fatigue -Vitamin B complex (containing fursultiamine 50 mg, hydroxocobalamin 250 micrograms, pyridoxal phosphate 30 mg, and riboflavin 5 mg) - B12 1036mg daily   Migraine headaches I recommend continue taking Excedrin Migraine as needed    Please continue to a heart-healthy diet and increase your physical activities. Try to exercise for 365ms at least five times a week.   Physical activity helps: Lower your blood glucose, improve your heart health, lower your blood pressure and cholesterol, burn calories to help manage her weight, gave you energy, lower stress, and improve his sleep.  The American diabetes Association (ADA) recommends being active for 2-1/2 hours (150 minutes) or more week.  Exercise for 30 minutes, 5 days a week (150 minutes total)    It was a pleasure to see you and I look forward to continuing to work together on your health and well-being. Please do not hesitate to call the office if you need care or have questions about your care.   Have a wonderful day and week. With Gratitude, GlAlvira MondaySN, FNP-BC

## 2022-04-03 NOTE — Assessment & Plan Note (Signed)
Complains of increased fatigue Denies systemic symptoms Will assess her thyroid levels, vitamin D, and hemoglobin Encouraged to take over-the-counter Vitamin B complex (containing fursultiamine 50 mg, hydroxocobalamin 250 micrograms, pyridoxal phosphate 30 mg, and riboflavin 5 mg) or  vit B12 1055mg daily  Patient verbalized understanding and  is aware of plan of care

## 2022-04-03 NOTE — Progress Notes (Signed)
Established Patient Office Visit  Subjective:  Patient ID: Kara Gentry, female    DOB: June 07, 1975  Age: 47 y.o. MRN: [id number]  CC:  Chief Complaint  Patient presents with   Follow-up    3 month f/u, pt reports weight concerns. Pt reports frequent headaches since last week, 03/27/2022.     HPI Kara Gentry is a 47 y.o. female with past medical history of migraines, vitamin D deficiency, and obesity presents for f/u of  chronic medical conditions. For the details of today's visit, please refer to the assessment and plan.       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   PONV (postoperative nausea and vomiting)    S/P ORIF (open reduction internal fixation) fracture 05/09/17 Ankle  Right     Seasonal allergies    Vitamin D deficiency 05/23/2016   Take 5000 IU vitamin D3 daily    Past Surgical History:  Procedure Laterality Date   APPLICATION OF WOUND VAC  01/03/2012   Procedure: APPLICATION OF WOUND VAC;  Surgeon: Jonnie Kind, MD;  Location: [facility];  Service: Gynecology;  Laterality: N/A;   APPLICATION OF WOUND VAC  01/05/2012   Procedure: APPLICATION OF WOUND VAC;  Surgeon: Jonnie Kind, MD;  Location: [facility];  Service: Gynecology;  Laterality: N/A;   APPLICATION OF WOUND VAC  01/07/2012   Procedure: APPLICATION OF WOUND VAC;  Surgeon: Jonnie Kind, MD;  Location: [facility];  Service: Gynecology;;   APPLICATION OF WOUND VAC  01/11/2012   Procedure: APPLICATION OF WOUND VAC;  Surgeon: Jonnie Kind, MD;  Location: [facility];  Service: Gynecology;  Laterality: N/A;  Fasicial Closure   CESAREAN SECTION  12/22/2011   Procedure: CESAREAN SECTION;  Surgeon: Donnamae Jude, MD;  Location: [facility];  Service: Obstetrics;   Laterality: N/A;  Primary cesarean section with delivery of Baby A girl at 45. Baby B girl at 81.   CESAREAN SECTION N/A    [employer] 07/26/2019   DILITATION & CURRETTAGE/HYSTROSCOPY WITH NOVASURE ABLATION N/A 09/04/2016   Procedure: HYSTEROSCOPY WITH NOVASURE ABLATION;  Surgeon: Jonnie Kind, MD;  Location: [facility];  Service: Gynecology;  Laterality: N/A;   ECTOPIC PREGNANCY SURGERY     FRACTURE SURGERY N/A    [employer]rees[employer] 07/26/2019   INCISION AND DRAINAGE OF WOUND  01/07/2012   Procedure: IRRIGATION AND DEBRIDEMENT WOUND;  Surgeon: Jonnie Kind, MD;  Location: [facility];  Service: Gynecology;;   INSERTION OF MESH  01/03/2012   Procedure: INSERTION OF MESH;  Surgeon: Jonnie Kind, MD;  Location: [facility];  Service: Gynecology;  Laterality: N/A;   MANDIBLE SURGERY Right cancer   Cancerous mass   NASAL SINUS SURGERY     ORIF ANKLE FRACTURE Right 05/09/2017   Procedure: OPEN REDUCTION INTERNAL FIXATION (ORIF) ANKLE FRACTURE;  Surgeon: Carole Civil, MD;  Location: [facility];  Service: Orthopedics;  Laterality: Right;   SECONDARY CLOSURE OF WOUND  01/22/2012   Procedure: SECONDARY CLOSURE OF WOUND;  Surgeon: Jonnie Kind, MD;  Location: [facility];  Service: Gynecology;  Laterality: N/A;   WOUND EXPLORATION  01/03/2012   Necrotizing fascitis at C section incision  WOUND EXPLORATION  01/05/2012   Procedure: WOUND EXPLORATION;  Surgeon: Jonnie Kind, MD;  Location: [facility];  Service: Gynecology;  Laterality: N/A;  Re-Exploration of Wound Abscess with Debridement   WOUND EXPLORATION  01/07/2012   Procedure: WOUND EXPLORATION;  Surgeon: Jonnie Kind, MD;  Location: [facility];  Service: Gynecology;;   WOUND EXPLORATION  01/11/2012   Procedure: WOUND EXPLORATION;  Surgeon: Jonnie Kind, MD;  Location: [facility];  Service: Gynecology;  Laterality: N/A;  Debridment of Wound Abscess   WOUND EXPLORATION  01/22/2012   Procedure: WOUND EXPLORATION;  Surgeon: Jonnie Kind, MD;  Location: [facility];   Service: Gynecology;  Laterality: N/A;  Delayed Secondary Wound Closure   WRIST SURGERY Left    Cyst removal    Family History  Problem Relation Age of Onset   Cancer Maternal Grandfather        prostate   Diabetes Mother    Arthritis Mother    Cancer Paternal Uncle    Cancer Maternal Uncle        lung   Diabetes Maternal Aunt    Arthritis Maternal Aunt     Social History   Socioeconomic History   Marital status: Married    Spouse name: Not on file   Number of children: Not on file   Years of education: masters   Highest education level: Not on file  Occupational History   Occupation: Product manager: [employer]  Tobacco Use   Smoking status: Never   Smokeless tobacco: Never  Vaping Use   Vaping Use: Never used  Substance and Sexual Activity   Alcohol use: No   Drug use: No   Sexual activity: Yes    Birth control/protection: Surgical    Comment: ablation  Other Topics Concern   Not on file  Social History Narrative   Not on file   Social Determinants of Health   Financial Resource Strain: Not on file  Food Insecurity: Not on file  Transportation Needs: Not on file  Physical Activity: Not on file  Stress: Not on file  Social Connections: Not on file  Intimate Partner Violence: Not on file    Outpatient Medications Prior to Visit  Medication Sig Dispense Refill   Multiple Vitamins-Minerals (ONE-A-DAY WOMENS PO) Take by mouth.     VITAMIN D PO Take by mouth.     aspirin-acetaminophen-caffeine (EXCEDRIN MIGRAINE) 250-250-65 MG tablet Take 1-2 tablets by mouth daily as needed for headache.  (Patient not taking: Reported on 01/01/2022)     cyclobenzaprine (FLEXERIL) 10 MG tablet Take 1 tablet (10 mg total) by mouth 3 (three) times daily as needed for muscle spasms. Do not drink alcohol or drive while taking this medication.  May cause drowsiness. (Patient not taking: Reported on 01/01/2022) 15 tablet 0   cyclobenzaprine (FLEXERIL) 5 MG tablet Take  1 tablet (5 mg total) by mouth 3 (three) times daily as needed for muscle spasms. (Patient not taking: Reported on 01/01/2022) 20 tablet 0   guaiFENesin (MUCINEX) 600 MG 12 hr tablet Take 1 tablet (600 mg total) by mouth 2 (two) times daily as needed for cough or to loosen phlegm. (Patient not taking: Reported on 01/01/2022) 30 tablet 0   naproxen (NAPROSYN) 500 MG tablet Take 1 tablet (500 mg total) by mouth 2 (two) times daily as needed. (Patient not taking: Reported on 01/01/2022) 30 tablet 0   sodium chloride (OCEAN) 0.65 % SOLN nasal spray Place 1 spray into  both nostrils as needed for congestion. (Patient not taking: Reported on 01/01/2022) 88 mL 0   topiramate (TOPAMAX) 25 MG tablet Take 1/2 tablet daily for one week Take 1 tablet daily orally starting week two. (Patient not taking: Reported on 01/01/2022) 30 tablet 0   naproxen sodium (ANAPROX DS) 550 MG tablet Take 1 tablet (550 mg total) by mouth 2 (two) times daily with a meal. (Patient not taking: Reported on 01/01/2022) 60 tablet 1   No facility-administered medications prior to visit.    No Known Allergies  ROS Review of Systems  Constitutional:  Positive for fatigue. Negative for chills and fever.       Night sweats  Eyes:  Negative for visual disturbance.  Respiratory:  Negative for chest tightness and shortness of breath.   Neurological:  Negative for dizziness and headaches.      Objective:    Physical Exam Constitutional:      Appearance: She is obese.  HENT:     Head: Normocephalic.     Mouth/Throat:     Mouth: Mucous membranes are moist.  Cardiovascular:     Rate and Rhythm: Normal rate.     Heart sounds: Normal heart sounds.  Pulmonary:     Effort: Pulmonary effort is normal.     Breath sounds: Normal breath sounds.  Neurological:     Mental Status: She is alert.     BP 138/82   Pulse 68   Ht 5' 2"$  (1.575 m)   Wt 197 lb (89.4 kg)   SpO2 98%   BMI 36.03 kg/m  Wt Readings from Last 3 Encounters:   04/03/22 197 lb (89.4 kg)  01/01/22 201 lb 1.9 oz (91.2 kg)  09/29/21 196 lb 0.6 oz (88.9 kg)    Lab Results  Component Value Date   TSH 0.880 01/02/2022   Lab Results  Component Value Date   WBC 9.2 01/02/2022   HGB 12.9 01/02/2022   HCT 39.8 01/02/2022   MCV 85 01/02/2022   PLT 270 01/02/2022   Lab Results  Component Value Date   NA 138 01/02/2022   K 4.5 01/02/2022   CO2 20 01/02/2022   GLUCOSE 92 01/02/2022   BUN 14 01/02/2022   CREATININE 0.71 01/02/2022   BILITOT 0.8 01/02/2022   ALKPHOS 61 01/02/2022   AST 13 01/02/2022   ALT 14 01/02/2022   PROT 7.3 01/02/2022   ALBUMIN 4.4 01/02/2022   CALCIUM 9.4 01/02/2022   ANIONGAP 11 05/08/2017   EGFR 106 01/02/2022   Lab Results  Component Value Date   CHOL 192 01/02/2022   Lab Results  Component Value Date   HDL 57 01/02/2022   Lab Results  Component Value Date   LDLCALC 125 (H) 01/02/2022   Lab Results  Component Value Date   TRIG 52 01/02/2022   Lab Results  Component Value Date   CHOLHDL 3.4 01/02/2022   Lab Results  Component Value Date   HGBA1C 6.1 (H) 01/02/2022      Assessment & Plan:  Night sweats Assessment & Plan: Denies systemic symptoms Complains of night sweats nightly Reports regular menstrual cycles Symptoms have been ongoing for few months Will treat with Paxil 7.5 mg daily I recommend avoiding caffeine, alcohol and spicy food close to your bedtime as this will can affect internal temperature and cause you to sweat. 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 Assessment & Plan: The patient has lost 4 pounds since her last visit She reports implementing lifestyle changes with a heart healthy diet She would like to speak with a dietitian Referral placed Encouraged to increase her physical activities, at least 150  minutes of moderate intensity exercises a week Patient verbalized understanding Wt Readings from Last 3 Encounters:  04/03/22 197 lb (89.4 kg)  01/01/22 201 lb 1.9 oz (91.2 kg)  09/29/21 196 lb 0.6 oz (88.9 kg)      Prediabetes -     Hemoglobin A1c -     Amb ref to Medical Nutrition Therapy-MNT  Migraine without aura and without status migrainosus, not intractable Assessment & Plan: Reports increased fatigue and increased headaches She takes Excedrin migraines with relief for her symptoms No headache reported today Encouraged to continue taking Excedrin migraines when symptomatic No red flag symptoms reported    Other fatigue Assessment & Plan: Complains of increased fatigue Denies systemic symptoms Will assess her thyroid levels, vitamin D, and hemoglobin Encouraged to take over-the-counter Vitamin B complex (containing fursultiamine 50 mg, hydroxocobalamin 250 micrograms, pyridoxal phosphate 30 mg, and riboflavin 5 mg) or  vit B12 1062mg daily  Patient verbalized understanding and  is aware of plan of care   Other hyperlipidemia -     Lipid panel -     CMP14+EGFR -     CBC with Differential/Platelet  Other specified hypothyroidism -     TSH + free T4  Vitamin D deficiency -     VITAMIN D 25 Hydroxy (Vit-D Deficiency, Fractures)  Unexplained night sweats -     PARoxetine Mesylate; Take 1 tablet by mouth daily.  Dispense: 30 capsule; Refill: 1    Follow-up: Return in about 3 months (around 07/02/2022).   GAlvira Monday FNP

## 2022-04-03 NOTE — Assessment & Plan Note (Signed)
The patient has lost 4 pounds since her last visit She reports implementing lifestyle changes with a heart healthy diet She would like to speak with a dietitian Referral placed Encouraged to increase her physical activities, at least 150 minutes of moderate intensity exercises a week Patient verbalized understanding Wt Readings from Last 3 Encounters:  04/03/22 197 lb (89.4 kg)  01/01/22 201 lb 1.9 oz (91.2 kg)  09/29/21 196 lb 0.6 oz (88.9 kg)

## 2022-04-04 NOTE — Telephone Encounter (Signed)
Lvm for pt

## 2022-04-11 NOTE — Progress Notes (Signed)
Your hemoglobin A1c has decreased from 6.1 to 5.8 and your LDL cholesterol has decreased from 125 to109.  Keep up the good work! I recommend avoiding simple carbohydrates, including cakes, sweet desserts, ice cream, soda (diet or regular), sweet tea, candies, chips, cookies, store-bought juices, alcohol in excess of 1-2 drinks a day, lemonade, artificial sweeteners, donuts, coffee creamers, and sugar-free products.  I recommend avoiding greasy, fatty foods with increased physical activity.  All other labs are stable.

## 2022-04-12 LAB — LIPID PANEL
Chol/HDL Ratio: 3 ratio (ref 0.0–4.4)
Cholesterol, Total: 179 mg/dL (ref 100–199)
HDL: 59 mg/dL (ref >39)
LDL Chol Calc (NIH): 109 mg/dL — ABNORMAL HIGH (ref 0–99)
Triglycerides: 56 mg/dL (ref 0–149)
VLDL Cholesterol Cal: 11 mg/dL (ref 5–40)

## 2022-04-12 LAB — CMP14+EGFR
ALT: 16 IU/L (ref 0–32)
AST: 14 IU/L (ref 0–40)
Albumin/Globulin Ratio: 1.8 (ref 1.2–2.2)
Albumin: 4.5 g/dL (ref 3.9–4.9)
Alkaline Phosphatase: 51 IU/L (ref 44–121)
BUN/Creatinine Ratio: 19 (ref 9–23)
BUN: 13 mg/dL (ref 6–24)
Bilirubin Total: 0.5 mg/dL (ref 0.0–1.2)
CO2: 19 mmol/L — ABNORMAL LOW (ref 20–29)
Calcium: 9.3 mg/dL (ref 8.7–10.2)
Chloride: 106 mmol/L (ref 96–106)
Creatinine, Ser: 0.68 mg/dL (ref 0.57–1.00)
Globulin, Total: 2.5 g/dL (ref 1.5–4.5)
Glucose: 111 mg/dL — ABNORMAL HIGH (ref 70–99)
Potassium: 4.1 mmol/L (ref 3.5–5.2)
Sodium: 142 mmol/L (ref 134–144)
Total Protein: 7 g/dL (ref 6.0–8.5)
eGFR: 108 mL/min/{1.73_m2} (ref >59)

## 2022-04-12 LAB — CBC WITH DIFFERENTIAL/PLATELET
Basophils Absolute: 0 10*3/uL (ref 0.0–0.2)
Basos: 1 %
EOS (ABSOLUTE): 0.1 10*3/uL (ref 0.0–0.4)
Eos: 1 %
Hematocrit: 37.9 % (ref 34.0–46.6)
Hemoglobin: 12.2 g/dL (ref 11.1–15.9)
Immature Grans (Abs): 0 10*3/uL (ref 0.0–0.1)
Immature Granulocytes: 0 %
Lymphocytes Absolute: 2 10*3/uL (ref 0.7–3.1)
Lymphs: 32 %
MCH: 27.4 pg (ref 26.6–33.0)
MCHC: 32.2 g/dL (ref 31.5–35.7)
MCV: 85 fL (ref 79–97)
Monocytes Absolute: 0.4 10*3/uL (ref 0.1–0.9)
Monocytes: 7 %
Neutrophils Absolute: 3.7 10*3/uL (ref 1.4–7.0)
Neutrophils: 59 %
Platelets: 265 10*3/uL (ref 150–450)
RBC: 4.45 x10E6/uL (ref 3.77–5.28)
RDW: 13.4 % (ref 11.7–15.4)
WBC: 6.2 10*3/uL (ref 3.4–10.8)

## 2022-04-12 LAB — HEMOGLOBIN A1C
Est. average glucose Bld gHb Est-mCnc: 120 mg/dL
Hgb A1c MFr Bld: 5.8 % — ABNORMAL HIGH (ref 4.8–5.6)

## 2022-04-12 LAB — TSH+FREE T4
Free T4: 1.21 ng/dL (ref 0.82–1.77)
TSH: 1.2 u[IU]/mL (ref 0.450–4.500)

## 2022-04-12 LAB — VITAMIN D 25 HYDROXY (VIT D DEFICIENCY, FRACTURES): Vit D, 25-Hydroxy: 41.8 ng/mL (ref 30.0–100.0)

## 2022-04-19 ENCOUNTER — Encounter: Payer: Self-pay | Admitting: Radiology

## 2022-05-16 ENCOUNTER — Telehealth: Payer: BC Managed Care – PPO | Admitting: Family Medicine

## 2022-05-16 ENCOUNTER — Encounter: Payer: Self-pay | Admitting: Family Medicine

## 2022-05-16 DIAGNOSIS — J069 Acute upper respiratory infection, unspecified: Secondary | ICD-10-CM | POA: Diagnosis not present

## 2022-05-16 MED ORDER — PROMETHAZINE-DM 6.25-15 MG/5ML PO SYRP: 5.0000 mL | ORAL_SOLUTION | Freq: Four times a day (QID) | ORAL | 0 refills | Status: DC | PRN

## 2022-05-16 NOTE — Progress Notes (Signed)
Virtual Visit via Video Note  I connected with Kara Gentry on 05/16/22 at 11:20 AM EDT by a video enabled telemedicine application and verified that I am speaking with the correct person using two identifiers.  Patient Location: Home Provider Location: Home Office  I discussed the limitations, risks, security, and privacy concerns of performing an evaluation and management service by video and the availability of in person appointments. I also discussed with the patient that there may be a patient responsible charge related to this service. The patient expressed understanding and agreed to proceed.  Subjective: PCP: Alvira Monday, FNP  Chief Complaint  Patient presents with   Cough    Cough started 05/02/2022 went to UC and they diagnosed with URI but can't get rid of cough, headache started 05/16/22. Had covid, flu, strep at [facility] Coughing up some phlegm with a yellow tint.   HPI  The patient is in today with complaints of a lingering cough since her upper respiratory infection on 05/02/2022.  Her cough is nonproductive and is worse at nighttime.  She does note a unilateral left-sided headache with runny nose.  No fever or chills reported.  No body aches, nausea, vomiting, or shortness of breath were reported.    ROS: Per HPI  Current Outpatient Medications:    promethazine-dextromethorphan (PROMETHAZINE-DM) 6.25-15 MG/5ML syrup, Take 5 mLs by mouth 4 (four) times daily as needed., Disp: 118 mL, Rfl: 0   aspirin-acetaminophen-caffeine (EXCEDRIN MIGRAINE) 250-250-65 MG tablet, Take 1-2 tablets by mouth daily as needed for headache.  (Patient not taking: Reported on 01/01/2022), Disp: , Rfl:    cyclobenzaprine (FLEXERIL) 10 MG tablet, Take 1 tablet (10 mg total) by mouth 3 (three) times daily as needed for muscle spasms. Do not drink alcohol or drive while taking this medication.  May cause drowsiness. (Patient not taking: Reported on 01/01/2022), Disp: 15 tablet, Rfl: 0    cyclobenzaprine (FLEXERIL) 5 MG tablet, Take 1 tablet (5 mg total) by mouth 3 (three) times daily as needed for muscle spasms. (Patient not taking: Reported on 01/01/2022), Disp: 20 tablet, Rfl: 0   guaiFENesin (MUCINEX) 600 MG 12 hr tablet, Take 1 tablet (600 mg total) by mouth 2 (two) times daily as needed for cough or to loosen phlegm. (Patient not taking: Reported on 01/01/2022), Disp: 30 tablet, Rfl: 0   Multiple Vitamins-Minerals (ONE-A-DAY WOMENS PO), Take by mouth. (Patient not taking: Reported on 05/16/2022), Disp: , Rfl:    naproxen (NAPROSYN) 500 MG tablet, Take 1 tablet (500 mg total) by mouth 2 (two) times daily as needed. (Patient not taking: Reported on 01/01/2022), Disp: 30 tablet, Rfl: 0   PARoxetine (PAXIL) 10 MG tablet, Take 1 tablet (10 mg total) by mouth daily. (Patient not taking: Reported on 05/16/2022), Disp: 30 tablet, Rfl: 1   sodium chloride (OCEAN) 0.65 % SOLN nasal spray, Place 1 spray into both nostrils as needed for congestion. (Patient not taking: Reported on 01/01/2022), Disp: 88 mL, Rfl: 0   topiramate (TOPAMAX) 25 MG tablet, Take 1/2 tablet daily for one week Take 1 tablet daily orally starting week two. (Patient not taking: Reported on 01/01/2022), Disp: 30 tablet, Rfl: 0   VITAMIN D PO, Take by mouth. (Patient not taking: Reported on 05/16/2022), Disp: , Rfl:   Observations/Objective: There were no vitals filed for this visit. Physical Exam Patient is well-developed, well-nourished in no acute distress.  Resting comfortably at home.  Head is normocephalic, atraumatic.  No labored breathing.  Speech is clear and  coherent with logical content.  Patient is alert and oriented at baseline.   Assessment and Plan: Viral upper respiratory tract infection -     Promethazine-DM; Take 5 mLs by mouth 4 (four) times daily as needed.  Dispense: 118 mL; Refill: 0  Take medication as prescribed. Increase fluids and allow for plenty of rest. Recommend Tylenolas needed for  pain, fever, or general discomfort. Recommend using a humidifier at bedtime during sleep to help with cough and nasal congestion.   Follow Up Instructions: Follow-up if your symptoms do not improve     I discussed the assessment and treatment plan with the patient. The patient was provided an opportunity to ask questions, and all were answered. The patient agreed with the plan and demonstrated an understanding of the instructions.   The patient was advised to call back or seek an in-person evaluation if the symptoms worsen or if the condition fails to improve as anticipated.  The above assessment and management plan was discussed with the patient. The patient verbalized understanding of and has agreed to the management plan.   Alvira Monday, FNP

## 2022-07-04 ENCOUNTER — Other Ambulatory Visit: Payer: Self-pay | Admitting: Family Medicine

## 2022-07-04 ENCOUNTER — Encounter: Payer: Self-pay | Admitting: Family Medicine

## 2022-07-04 ENCOUNTER — Ambulatory Visit: Payer: BC Managed Care – PPO | Admitting: Family Medicine

## 2022-07-04 VITALS — BP 128/78 | HR 74 | Ht 62.0 in | Wt 196.2 lb

## 2022-07-04 DIAGNOSIS — R7301 Impaired fasting glucose: Secondary | ICD-10-CM | POA: Diagnosis not present

## 2022-07-04 DIAGNOSIS — G43001 Migraine without aura, not intractable, with status migrainosus: Secondary | ICD-10-CM

## 2022-07-04 DIAGNOSIS — Z1231 Encounter for screening mammogram for malignant neoplasm of breast: Secondary | ICD-10-CM

## 2022-07-04 DIAGNOSIS — E038 Other specified hypothyroidism: Secondary | ICD-10-CM

## 2022-07-04 DIAGNOSIS — E669 Obesity, unspecified: Secondary | ICD-10-CM

## 2022-07-04 DIAGNOSIS — E559 Vitamin D deficiency, unspecified: Secondary | ICD-10-CM

## 2022-07-04 DIAGNOSIS — E7849 Other hyperlipidemia: Secondary | ICD-10-CM

## 2022-07-04 NOTE — Patient Instructions (Addendum)
I appreciate the opportunity to provide care to you today!    Follow up:  3 months  Labs: please stop by the lab today to get your blood drawn (CBC, CMP, TSH, Lipid profile, HgA1c, Vit D)   Eat three meals per day at times discussed. Cut out all diet bevergages and drink only water Eat whole food plant based meals Cut out junk food, fast food and processed foods Exercise 150 minutes a week Lose 1-2 lbs per week. Keep a food journal Choose foods that grow in a garden or in a fruit orchard and protein of animals with fins or feathers.  Lifestyle Medicine - Whole Food, Plant Predominant Nutrition is highly recommended: Eat Plenty of vegetables, Mushrooms, fruits, Legumes, Whole Grains, Nuts, seeds in lieu of processed meats, processed snacks/pastries red meat, poultry, eggs.  -It is better to avoid simple carbohydrates including: Cakes, Sweet Desserts, Ice Cream, Soda (diet and regular), Sweet Tea, Candies, Chips, Cookies, Store Bought Juices, Alcohol in Excess of  1-2 drinks a day, Lemonade,  Artificial Sweeteners, Doughnuts, Coffee Creamers, "Sugar-free" Products, etc, etc.  This is not a complete list..... Exercise: If you are able: 30 -60 minutes a day ,4 days a week, or 150 minutes a week.  The longer the better.  Combine stretch, strength, and aerobic activities.  If you were told in the past that you have high risk for cardiovascular diseases, you may seek evaluation by your heart doctor prior to initiating moderate to intense exercise programs.  Healthy weight isn't about following a diet or program. Instead, it involves a lifestyle with healthy eating patterns, regular physical activity, and stress management. People with gradual, steady weight loss (about 1 to 2 pounds per week) are more likely to keep the weight off than people who lose weight quickly.    Please continue to a heart-healthy diet and increase your physical activities. Try to exercise for at least five days a week.       It was a pleasure to see you and I look forward to continuing to work together on your health and well-being. Please do not hesitate to call the office if you need care or have questions about your care.   Have a wonderful day and week. With Gratitude, Gilmore Laroche MSN, FNP-BC

## 2022-07-04 NOTE — Progress Notes (Signed)
Established Patient Office Visit  Subjective:  Patient ID: Kara Gentry, female    DOB: Nov 02, 1975  Age: 47 y.o. MRN: [id number]  CC:  Chief Complaint  Patient presents with   Chronic Care Management    3 month f/u     HPI Kara Gentry is a 47 y.o. female with past medical history of vasomotor symptoms and migraines presents for f/u of  chronic medical conditions. For the details of today's visit, please refer to the assessment and plan.     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   PONV (postoperative nausea and vomiting)    S/P ORIF (open reduction internal fixation) fracture 05/09/17 Ankle  Right     Seasonal allergies    Vitamin D deficiency 05/23/2016   Take 5000 IU vitamin D3 daily    Past Surgical History:  Procedure Laterality Date   APPLICATION OF WOUND VAC  01/03/2012   Procedure: APPLICATION OF WOUND VAC;  Surgeon: Tilda Burrow, MD;  Location: [facility];  Service: Gynecology;  Laterality: N/A;   APPLICATION OF WOUND VAC  01/05/2012   Procedure: APPLICATION OF WOUND VAC;  Surgeon: Tilda Burrow, MD;  Location: [facility];  Service: Gynecology;  Laterality: N/A;   APPLICATION OF WOUND VAC  01/07/2012   Procedure: APPLICATION OF WOUND VAC;  Surgeon: Tilda Burrow, MD;  Location: [facility];  Service: Gynecology;;   APPLICATION OF WOUND VAC  01/11/2012   Procedure: APPLICATION OF WOUND VAC;  Surgeon: Tilda Burrow, MD;  [facility];  Service: Gynecology;  Laterality: N/A;  Fasicial Closure   CESAREAN SECTION  12/22/2011   Procedure: CESAREAN SECTION;  Surgeon: Reva Bores, MD;  Location: [facility];  Service: Obstetrics;  Laterality: N/A;  Primary cesarean section with delivery of Baby A girl at 67. Baby B girl at  46.   CESAREAN SECTION N/A    Phreesia 07/26/2019   DILITATION & CURRETTAGE/HYSTROSCOPY WITH NOVASURE ABLATION N/A 09/04/2016   Procedure: HYSTEROSCOPY WITH NOVASURE ABLATION;  Surgeon: Tilda Burrow, MD;  Location: [facility];  Service: Gynecology;  Laterality: N/A;   ECTOPIC PREGNANCY SURGERY     FRACTURE SURGERY N/A    Phreesia 07/26/2019   INCISION AND DRAINAGE OF WOUND  01/07/2012   Procedure: IRRIGATION AND DEBRIDEMENT WOUND;  Surgeon: Tilda Burrow, MD;  [facility]: [facility];  Service: Gynecology;;   INSERTION OF MESH  01/03/2012   Procedure: INSERTION OF MESH;  Surgeon: Tilda Burrow, MD;  [facility];  Service: Gynecology;  Laterality: N/A;   MANDIBLE SURGERY Right cancer   Cancerous mass   NASAL SINUS SURGERY     ORIF ANKLE FRACTURE Right 05/09/2017   Procedure: OPEN REDUCTION INTERNAL FIXATION (ORIF) ANKLE FRACTURE;  Surgeon: Vickki Hearing, MD;  Location: [facility];  Service: Orthopedics;  Laterality: Right;   SECONDARY CLOSURE OF WOUND  01/22/2012   Procedure: SECONDARY CLOSURE OF WOUND;  Surgeon: Tilda Burrow, MD;  [facility];  Service: Gynecology;  Laterality: N/A;   WOUND EXPLORATION  01/03/2012   Necrotizing fascitis at C section incision   WOUND EXPLORATION  01/05/2012   Procedure: WOUND EXPLORATION;  Surgeon: Jonny Ruiz  Benancio Deeds, MD;  [facility]: [facility];  Service: Gynecology;  Laterality: N/A;  Re-Exploration of Wound Abscess with Debridement   WOUND EXPLORATION  01/07/2012   Procedure: WOUND EXPLORATION;  Surgeon: Tilda Burrow, MD;  [facility];  Service: Gynecology;;   WOUND EXPLORATION  01/11/2012   Procedure: WOUND EXPLORATION;  Surgeon: Tilda Burrow, MD;  [facility];  Service: Gynecology;  Laterality: N/A;  Debridment of Wound Abscess   WOUND EXPLORATION  01/22/2012   Procedure: WOUND EXPLORATION;  Surgeon: Tilda Burrow, MD;  [facility];  Service: Gynecology;  Laterality: N/A;  Delayed Secondary Wound Closure   WRIST SURGERY Left     Cyst removal    Family History  Problem Relation Age of Onset   Cancer Maternal Grandfather        prostate   Diabetes Mother    Arthritis Mother    Cancer Paternal Uncle    Cancer Maternal Uncle        lung   Diabetes Maternal Aunt    Arthritis Maternal Aunt     Social History   Socioeconomic History   Marital status: Married    Spouse name: Not on file   Number of children: Not on file   Years of education: masters   Highest education level: Not on file  Occupational History   Occupation: Magazine features editor: [employer]  Tobacco Use   Smoking status: Never   Smokeless tobacco: Never  Vaping Use   Vaping Use: Never used  Substance and Sexual Activity   Alcohol use: No   Drug use: No   Sexual activity: Yes    Birth control/protection: Surgical    Comment: ablation  Other Topics Concern   Not on file  Social History Narrative   Not on file   Social Determinants of Health   Financial Resource Strain: Not on file  Food Insecurity: Not on file  Transportation Needs: Not on file  Physical Activity: Not on file  Stress: Not on file  Social Connections: Not on file  Intimate Partner Violence: Not on file    Outpatient Medications Prior to Visit  Medication Sig Dispense Refill   aspirin-acetaminophen-caffeine (EXCEDRIN MIGRAINE) 250-250-65 MG tablet Take 1-2 tablets by mouth daily as needed for headache.     Multiple Vitamins-Minerals (ONE-A-DAY WOMENS PO) Take by mouth.     VITAMIN D PO Take by mouth.     guaiFENesin (MUCINEX) 600 MG 12 hr tablet Take 1 tablet (600 mg total) by mouth 2 (two) times daily as needed for cough or to loosen phlegm. (Patient not taking: Reported on 01/01/2022) 30 tablet 0   PARoxetine (PAXIL) 10 MG tablet Take 1 tablet (10 mg total) by mouth daily. 30 tablet 1   topiramate (TOPAMAX) 25 MG tablet Take 1/2 tablet daily for one week Take 1 tablet daily orally starting week two. (Patient not taking: Reported on 01/01/2022) 30  tablet 0   cyclobenzaprine (FLEXERIL) 10 MG tablet Take 1 tablet (10 mg total) by mouth 3 (three) times daily as needed for muscle spasms. Do not drink alcohol or drive while taking this medication.  May cause drowsiness. (Patient not taking: Reported on 07/04/2022) 15 tablet 0   cyclobenzaprine (FLEXERIL) 5 MG tablet Take 1 tablet (5 mg total) by mouth 3 (three) times daily as needed for muscle spasms. (Patient not taking: Reported on 01/01/2022) 20 tablet 0   naproxen (NAPROSYN) 500 MG tablet Take 1 tablet (500 mg total)  by mouth 2 (two) times daily as needed. (Patient not taking: Reported on 01/01/2022) 30 tablet 0   promethazine-dextromethorphan (PROMETHAZINE-DM) 6.25-15 MG/5ML syrup Take 5 mLs by mouth 4 (four) times daily as needed. 118 mL 0   sodium chloride (OCEAN) 0.65 % SOLN nasal spray Place 1 spray into both nostrils as needed for congestion. (Patient not taking: Reported on 01/01/2022) 88 mL 0   No facility-administered medications prior to visit.    No Known Allergies  ROS Review of Systems  Constitutional:  Negative for chills and fever.  Eyes:  Negative for visual disturbance.  Respiratory:  Negative for chest tightness and shortness of breath.   Neurological:  Negative for dizziness and headaches.      Objective:    Physical Exam Constitutional:      Appearance: She is obese.  HENT:     Head: Normocephalic.     Mouth/Throat:     Mouth: Mucous membranes are moist.  Cardiovascular:     Rate and Rhythm: Normal rate.     Heart sounds: Normal heart sounds.  Pulmonary:     Effort: Pulmonary effort is normal.     Breath sounds: Normal breath sounds.  Neurological:     Mental Status: She is alert.     BP 128/78   Pulse 74   Ht 5\' 2"  (1.575 m)   Wt 196 lb 3.2 oz (89 kg)   SpO2 97%   BMI 35.89 kg/m  Wt Readings from Last 3 Encounters:  07/04/22 196 lb 3.2 oz (89 kg)  04/03/22 197 lb (89.4 kg)  01/01/22 201 lb 1.9 oz (91.2 kg)    Lab Results  Component  Value Date   TSH 1.200 04/09/2022   Lab Results  Component Value Date   WBC 6.2 04/09/2022   HGB 12.2 04/09/2022   HCT 37.9 04/09/2022   MCV 85 04/09/2022   PLT 265 04/09/2022   Lab Results  Component Value Date   NA 142 04/09/2022   K 4.1 04/09/2022   CO2 19 (L) 04/09/2022   GLUCOSE 111 (H) 04/09/2022   BUN 13 04/09/2022   CREATININE 0.68 04/09/2022   BILITOT 0.5 04/09/2022   ALKPHOS 51 04/09/2022   AST 14 04/09/2022   ALT 16 04/09/2022   PROT 7.0 04/09/2022   ALBUMIN 4.5 04/09/2022   CALCIUM 9.3 04/09/2022   ANIONGAP 11 05/08/2017   EGFR 108 04/09/2022   Lab Results  Component Value Date   CHOL 179 04/09/2022   Lab Results  Component Value Date   HDL 59 04/09/2022   Lab Results  Component Value Date   LDLCALC 109 (H) 04/09/2022   Lab Results  Component Value Date   TRIG 56 04/09/2022   Lab Results  Component Value Date   CHOLHDL 3.0 04/09/2022   Lab Results  Component Value Date   HGBA1C 5.8 (H) 04/09/2022      Assessment & Plan:  Obesity, Class II, BMI 35-39.9, 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 Assessment & Plan: Reports inability to lose weight Admits to stress eating with minimal physical activity My women and plan reviewed with the patient Encouraged the patient to implement lifestyle changes with a heart healthy diet and increase physical activity Patient verbalized understanding Wt Readings from Last 3 Encounters:  07/04/22 196 lb 3.2 oz (89 kg)  04/03/22 197 lb (89.4 kg)  01/01/22 201 lb 1.9 oz (91.2 kg)       Migraine without aura and with status migrainosus, not intractable Assessment & Plan: Reports having less  frequent migraine headaches Has not taken Topamax in the last 2 months Will continue to monitor   IFG (impaired fasting glucose) -     Hemoglobin A1c  Vitamin D deficiency -     VITAMIN D 25 Hydroxy (Vit-D Deficiency, Fractures)  Other specified hypothyroidism -     TSH + free T4  Other hyperlipidemia -     Lipid panel -      CMP14+EGFR -     CBC with Differential/Platelet    Follow-up: Return in about 3 months (around 10/04/2022).   Gilmore Laroche, FNP

## 2022-07-04 NOTE — Assessment & Plan Note (Signed)
Reports inability to lose weight Admits to stress eating with minimal physical activity My women and plan reviewed with the patient Encouraged the patient to implement lifestyle changes with a heart healthy diet and increase physical activity Patient verbalized understanding Wt Readings from Last 3 Encounters:  07/04/22 196 lb 3.2 oz (89 kg)  04/03/22 197 lb (89.4 kg)  01/01/22 201 lb 1.9 oz (91.2 kg)

## 2022-07-04 NOTE — Assessment & Plan Note (Signed)
Reports having less frequent migraine headaches Has not taken Topamax in the last 2 months Will continue to monitor

## 2022-07-05 LAB — CMP14+EGFR
ALT: 13 IU/L (ref 0–32)
AST: 13 IU/L (ref 0–40)
Albumin/Globulin Ratio: 1.5 (ref 1.2–2.2)
Albumin: 4.2 g/dL (ref 3.9–4.9)
Alkaline Phosphatase: 47 IU/L (ref 44–121)
BUN/Creatinine Ratio: 24 — ABNORMAL HIGH (ref 9–23)
BUN: 15 mg/dL (ref 6–24)
Bilirubin Total: 0.6 mg/dL (ref 0.0–1.2)
CO2: 22 mmol/L (ref 20–29)
Calcium: 9 mg/dL (ref 8.7–10.2)
Chloride: 106 mmol/L (ref 96–106)
Creatinine, Ser: 0.62 mg/dL (ref 0.57–1.00)
Globulin, Total: 2.8 g/dL (ref 1.5–4.5)
Glucose: 106 mg/dL — ABNORMAL HIGH (ref 70–99)
Potassium: 4.2 mmol/L (ref 3.5–5.2)
Sodium: 141 mmol/L (ref 134–144)
Total Protein: 7 g/dL (ref 6.0–8.5)
eGFR: 110 mL/min/{1.73_m2} (ref >59)

## 2022-07-05 LAB — TSH+FREE T4
Free T4: 1.17 ng/dL (ref 0.82–1.77)
TSH: 0.99 u[IU]/mL (ref 0.450–4.500)

## 2022-07-05 LAB — CBC WITH DIFFERENTIAL/PLATELET
Basophils Absolute: 0 10*3/uL (ref 0.0–0.2)
Basos: 1 %
EOS (ABSOLUTE): 0.1 10*3/uL (ref 0.0–0.4)
Eos: 1 %
Hematocrit: 37.7 % (ref 34.0–46.6)
Hemoglobin: 12.2 g/dL (ref 11.1–15.9)
Immature Grans (Abs): 0 10*3/uL (ref 0.0–0.1)
Immature Granulocytes: 0 %
Lymphocytes Absolute: 1.7 10*3/uL (ref 0.7–3.1)
Lymphs: 32 %
MCH: 27.7 pg (ref 26.6–33.0)
MCHC: 32.4 g/dL (ref 31.5–35.7)
MCV: 86 fL (ref 79–97)
Monocytes Absolute: 0.3 10*3/uL (ref 0.1–0.9)
Monocytes: 6 %
Neutrophils Absolute: 3.3 10*3/uL (ref 1.4–7.0)
Neutrophils: 60 %
Platelets: 257 10*3/uL (ref 150–450)
RBC: 4.41 x10E6/uL (ref 3.77–5.28)
RDW: 13.4 % (ref 11.7–15.4)
WBC: 5.4 10*3/uL (ref 3.4–10.8)

## 2022-07-05 LAB — LIPID PANEL
Chol/HDL Ratio: 3.6 ratio (ref 0.0–4.4)
Cholesterol, Total: 184 mg/dL (ref 100–199)
HDL: 51 mg/dL (ref >39)
LDL Chol Calc (NIH): 125 mg/dL — ABNORMAL HIGH (ref 0–99)
Triglycerides: 39 mg/dL (ref 0–149)
VLDL Cholesterol Cal: 8 mg/dL (ref 5–40)

## 2022-07-05 LAB — HEMOGLOBIN A1C
Est. average glucose Bld gHb Est-mCnc: 128 mg/dL
Hgb A1c MFr Bld: 6.1 % — ABNORMAL HIGH (ref 4.8–5.6)

## 2022-07-05 LAB — VITAMIN D 25 HYDROXY (VIT D DEFICIENCY, FRACTURES): Vit D, 25-Hydroxy: 40.3 ng/mL (ref 30.0–100.0)

## 2022-07-05 NOTE — Progress Notes (Signed)
Please inform the patient her hemoglobin A1c is 6.1.  This indicate that she is prediabetic. I recommend avoiding simple carbohydrates, including cakes, sweet desserts, ice cream, soda (diet or regular), sweet tea, candies, chips, cookies, store-bought juices, alcohol in excess of 1-2 drinks a day, lemonade, artificial sweeteners, donuts, coffee creamers, and sugar-free products.  I recommend avoiding greasy, fatty foods with increased physical activity.  Her LDL cholesterol is elevated.  I want her LDL cholesterol to be less than 100.  I recommend a diet low in greasy, fatty, starchy foods with increased physical activity.  All other labs are stable

## 2022-08-21 ENCOUNTER — Encounter: Payer: Self-pay | Admitting: Internal Medicine

## 2022-08-21 ENCOUNTER — Ambulatory Visit: Payer: BC Managed Care – PPO | Admitting: Internal Medicine

## 2022-08-21 ENCOUNTER — Telehealth: Payer: Self-pay | Admitting: Internal Medicine

## 2022-08-21 VITALS — BP 126/78 | HR 76 | Ht 62.0 in | Wt 199.1 lb

## 2022-08-21 DIAGNOSIS — M25511 Pain in right shoulder: Secondary | ICD-10-CM | POA: Diagnosis not present

## 2022-08-21 DIAGNOSIS — G8929 Other chronic pain: Secondary | ICD-10-CM | POA: Diagnosis not present

## 2022-08-21 DIAGNOSIS — M62838 Other muscle spasm: Secondary | ICD-10-CM

## 2022-08-21 MED ORDER — METHOCARBAMOL 750 MG PO TABS: 750.0000 mg | ORAL_TABLET | Freq: Three times a day (TID) | ORAL | 0 refills | Status: AC | PRN

## 2022-08-21 MED ORDER — MELOXICAM 7.5 MG PO TABS: 7.5000 mg | ORAL_TABLET | Freq: Every day | ORAL | 0 refills | Status: DC

## 2022-08-21 NOTE — Telephone Encounter (Signed)
[facility]ara Gentry [facility] called needs order fixed to say Occupational therapy instead of physical therapy. Kara Gentry call back #  [phone]

## 2022-08-21 NOTE — Progress Notes (Signed)
   HPI:Ms.Kara Gentry is a 47 y.o. female who presents for evaluation of shoulder pain.Has a chronic shoulder pain and it is always worse in her right shoulder . Pain comes and goes since she was young. Told she had bursitis at one time. She avoids sleeping on her shoulder due to this pain. Today she is experiencing right exacerbation of right shoulder pain which started 2 weeks ago. She doesn't recall anything which exacerbated this pain.  Recently she was laying on her bed, on her stomach. When she got up she had muscle spasm in lower cervical neck. This has happened a few more times. She takes tylenol for aching pain.   Physical Exam: Vitals:   08/21/22 0955  BP: 126/78  Pulse: 76  SpO2: 93%  Weight: 199 lb 1.9 oz (90.3 kg)  Height: 5\' 2"  (1.575 m)     Physical Exam Neck:     Comments: Shoulder: No obvious deformity or asymmetry. No bruising. No swelling No TTP Full ROM in flexion, abduction, internal/external rotation NV intact distally Special Tests:  - Impingement: Neg Hawkins and Neers.  - Supraspinatus: Pain with empty can test ,5/5 strength - Infraspinatus/Teres: 5/5 strength with ER - Subscapularis: 5/5 strength with IR - Biceps tendon: Negative Speeds.     Musculoskeletal:     Right shoulder: No swelling.     Cervical back: Normal range of motion and neck supple. No rigidity, torticollis or crepitus. No pain with movement, spinous process tenderness or muscular tenderness.      Assessment & Plan:   Kara Gentry was seen today for follow-up.  Chronic right shoulder pain Assessment & Plan: Chronic problem with exacerbation No significant weakness, patient does have pain in supraspinatus with empty can testing.  Referral to therapist for strengthening exercises Mobic for 10 days Follow up in 6 weeks.     Orders: -     Meloxicam; Take 1 tablet (7.5 mg total) by mouth daily.  Dispense: 10 tablet; Refill: 0 -     Ambulatory referral to Occupational  Therapy  Neck muscle spasm Assessment & Plan: Assessment/Plan: Acute uncomplicated illness Patient has strained posterior neck muscles, likely in setting of compensating shoulder pain.  Prescribed Methocarbamol prn for up to 7 day  Orders: -     Methocarbamol; Take 1 tablet (750 mg total) by mouth every 8 (eight) hours as needed for up to 7 days for muscle spasms.  Dispense: 20 tablet; Refill: 0      Milus Banister, MD

## 2022-08-21 NOTE — Patient Instructions (Signed)
Thank you, Ms.Briani L Sebastiano for allowing [place] to provide your care today.   shoulder pain - Ambulatory referral to Physical Therapy - meloxicam (MOBIC) 7.5 MG tablet; Take 1 tablet (7.5 mg total) by mouth daily.  Dispense: 10 tablet; Refill: 0  Neck muscle spasm - methocarbamol (ROBAXIN) 750 MG tablet; Take 1 tablet (750 mg total) by mouth every 8 (eight) hours as needed for up to 7 days for muscle spasms.  Dispense: 20 tablet; Refill: 0   Referrals ordered today:    Referral Orders         Ambulatory referral to Physical Therapy       Reminders: Follow up in 6 weeks.     Thurmon Fair, M.D.

## 2022-08-26 DIAGNOSIS — G8929 Other chronic pain: Secondary | ICD-10-CM | POA: Insufficient documentation

## 2022-08-26 DIAGNOSIS — M62838 Other muscle spasm: Secondary | ICD-10-CM | POA: Insufficient documentation

## 2022-08-26 NOTE — Assessment & Plan Note (Signed)
Assessment/Plan: Acute uncomplicated illness Patient has strained posterior neck muscles, likely in setting of compensating shoulder pain.  Prescribed Methocarbamol prn for up to 7 day

## 2022-08-26 NOTE — Assessment & Plan Note (Addendum)
Chronic problem with exacerbation No significant weakness, patient does have pain in supraspinatus with empty can testing.  Referral to therapist for strengthening exercises Mobic for 10 days Follow up in 6 weeks.

## 2022-09-07 ENCOUNTER — Ambulatory Visit (HOSPITAL_COMMUNITY): Payer: BC Managed Care – PPO | Attending: Internal Medicine | Admitting: Occupational Therapy

## 2022-09-07 ENCOUNTER — Encounter (HOSPITAL_COMMUNITY): Payer: Self-pay | Admitting: Occupational Therapy

## 2022-09-07 DIAGNOSIS — M25511 Pain in right shoulder: Secondary | ICD-10-CM | POA: Diagnosis present

## 2022-09-07 DIAGNOSIS — G8929 Other chronic pain: Secondary | ICD-10-CM | POA: Diagnosis present

## 2022-09-07 DIAGNOSIS — M25611 Stiffness of right shoulder, not elsewhere classified: Secondary | ICD-10-CM | POA: Insufficient documentation

## 2022-09-07 DIAGNOSIS — R29898 Other symptoms and signs involving the musculoskeletal system: Secondary | ICD-10-CM | POA: Insufficient documentation

## 2022-09-07 NOTE — Therapy (Signed)
OUTPATIENT OCCUPATIONAL THERAPY ORTHO EVALUATION  Patient Name: Kara Gentry MRN: [id number] DOB:14-Aug-1975, 47 y.o., female Today's Date: 09/07/2022   END OF SESSION:  OT End of Session - 09/07/22 1543     Visit Number 1    Number of Visits 5    Date for OT Re-Evaluation 10/19/22    Authorization Type BCBS    OT Start Time [phone]    OT Stop Time 0918    OT Time Calculation (min) 26 min    Activity Tolerance Patient tolerated treatment well    Behavior During Therapy [facility] for tasks assessed/performed             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   PONV (postoperative nausea and vomiting)    S/P ORIF (open reduction internal fixation) fracture 05/09/17 Ankle  Right     Seasonal allergies    Vitamin D deficiency 05/23/2016   Take 5000 IU vitamin D3 daily   Past Surgical History:  Procedure Laterality Date   APPLICATION OF WOUND VAC  01/03/2012   Procedure: APPLICATION OF WOUND VAC;  Surgeon: Tilda Burrow, MD;  [facility]: [facility];  Service: Gynecology;  Laterality: N/A;   APPLICATION OF WOUND VAC  01/05/2012   Procedure: APPLICATION OF WOUND VAC;  Surgeon: Tilda Burrow, MD;  Location: [facility];  Service: Gynecology;  Laterality: N/A;   APPLICATION OF WOUND VAC  01/07/2012   Procedure: APPLICATION OF WOUND VAC;  Surgeon: Tilda Burrow, MD;  [facility]: [facility];  Service: Gynecology;;   APPLICATION OF WOUND VAC  01/11/2012   Procedure: APPLICATION OF WOUND VAC;  Surgeon: Tilda Burrow, MD;  [facility];  Service: Gynecology;  Laterality: N/A;  Fasicial Closure   CESAREAN SECTION  12/22/2011   Procedure: CESAREAN SECTION;  Surgeon: Reva Bores, MD;  Location: [facility];  Service: Obstetrics;  Laterality: N/A;  Primary  cesarean section with delivery of Baby A girl at 51. Baby B girl at 9.   CESAREAN SECTION N/A    Phreesia 07/26/2019   DILITATION & CURRETTAGE/HYSTROSCOPY WITH NOVASURE ABLATION N/A 09/04/2016   Procedure: HYSTEROSCOPY WITH NOVASURE ABLATION;  Surgeon: Tilda Burrow, MD;  Location: [facility];  Service: Gynecology;  Laterality: N/A;   ECTOPIC PREGNANCY SURGERY     FRACTURE SURGERY N/A    Phreesia 07/26/2019   INCISION AND DRAINAGE OF WOUND  01/07/2012   Procedure: IRRIGATION AND DEBRIDEMENT WOUND;  Surgeon: Tilda Burrow, MD;  Location: [facility];  Service: Gynecology;;   INSERTION OF MESH  01/03/2012   Procedure: INSERTION OF MESH;  Surgeon: Tilda Burrow, MD;  [facility]: [facility];  Service: Gynecology;  Laterality: N/A;   MANDIBLE SURGERY Right cancer   Cancerous mass   NASAL SINUS SURGERY     ORIF ANKLE FRACTURE Right 05/09/2017   Procedure: OPEN REDUCTION INTERNAL FIXATION (ORIF) ANKLE FRACTURE;  Surgeon: Vickki Hearing, MD;  Location: [facility];  Service: Orthopedics;  Laterality: Right;   SECONDARY CLOSURE OF WOUND  01/22/2012   Procedure: SECONDARY CLOSURE OF WOUND;  Surgeon: Tilda Burrow, MD;  Location: [facility];  Service: Gynecology;  Laterality: N/A;   WOUND EXPLORATION  01/03/2012  Necrotizing fascitis at C section incision   WOUND EXPLORATION  01/05/2012   Procedure: WOUND EXPLORATION;  Surgeon: Tilda Burrow, MD;  Location: [facility];  Service: Gynecology;  Laterality: N/A;  Re-Exploration of Wound Abscess with Debridement   WOUND EXPLORATION  01/07/2012   Procedure: WOUND EXPLORATION;  Surgeon: Tilda Burrow, MD;  Location: [facility];  Service: Gynecology;;   WOUND EXPLORATION  01/11/2012   Procedure: WOUND EXPLORATION;  Surgeon: Tilda Burrow, MD;  [facility]: [facility];  Service: Gynecology;  Laterality: N/A;  Debridment of Wound Abscess   WOUND EXPLORATION  01/22/2012   Procedure: WOUND EXPLORATION;  Surgeon: Tilda Burrow, MD;  [facility]: [facility];  Service: Gynecology;   Laterality: N/A;  Delayed Secondary Wound Closure   WRIST SURGERY Left    Cyst removal   Patient Active Problem List   Diagnosis Date Noted   Neck muscle spasm 08/26/2022   Chronic right shoulder pain 08/26/2022   Night sweats 04/03/2022   Fatigue 04/03/2022   Obesity, Class II, BMI 35-39.9, 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 01/01/2022   Migraine 09/29/2021   Hx of migraines 07/30/2019   Pelvic pain in female 12/19/2016   Menorrhagia with regular cycle 09/04/2016   Fibroids 05/31/2016   Vitamin D deficiency 05/23/2016   Pelvic pain 05/21/2016    PCP: Gilmore Laroche, NP REFERRING PROVIDER: Albertha Ghee, MD  ONSET DATE: ~3 weeks  REFERRING DIAG: Chronic R shoulder Pain  THERAPY DIAG:  Chronic right shoulder pain  Shoulder stiffness, right  Other symptoms and signs involving the musculoskeletal system  Rationale for Evaluation and Treatment: Rehabilitation  SUBJECTIVE:   SUBJECTIVE STATEMENT: "It is a constant soreness and sometimes it seizes up." Pt accompanied by: self  PERTINENT HISTORY: Past medical history of vasomotor symptoms and migraines   PRECAUTIONS: None  WEIGHT BEARING RESTRICTIONS: No  PAIN:  Are you having pain? Yes: NPRS scale: 2/10 Pain location: top of the shoulder and trapezius Pain description: soreness and aching Aggravating factors: sleeping Relieving factors: changing position  FALLS: Has patient fallen in last 6 months? No  PLOF: Independent  PATIENT GOALS: "To get the pain to stop"  NEXT MD VISIT: 10/04/22  OBJECTIVE:   HAND DOMINANCE: Right  ADLs: Overall ADLs: Pt has limitations with ADL's and IADL's due to increased pain and seizing/grabbing sensation, limiting ROM. Additionally this affects her sleep due to increased pain at night.   FUNCTIONAL OUTCOME MEASURES: FOTO: Will Complete Next Session  UPPER EXTREMITY ROM:       Assessed in seated, er/IR adducted  Active ROM Right eval  Shoulder flexion 156  Shoulder abduction 167   Shoulder internal rotation 90  Shoulder external rotation 36  (Blank rows = not tested)    UPPER EXTREMITY MMT:     Assessed in seated, er/IR adducted  MMT Right eval  Shoulder flexion 5/5  Shoulder abduction 4+/5  Shoulder internal rotation 4+/5  Shoulder external rotation 4+/5  (Blank rows = not tested)  SENSATION: WFL  EDEMA: No swelling noted  OBSERVATIONS: moderate fascial restrictions noted in the trapezius, scapularis, pectoralis, deltoid.   TODAY'S TREATMENT:  DATE: 09/07/22: Evaluation Only    PATIENT EDUCATION: Education details: A/ROM Person educated: Patient Education method: Programmer, multimedia, Facilities manager, and Handouts Education comprehension: verbalized understanding and returned demonstration  HOME EXERCISE PROGRAM: 7/19: A/ROM  GOALS: Goals reviewed with patient? Yes   SHORT TERM GOALS: Target date: 10/19/22  Pt will be provided with and educated on HEP to improve mobility in RUE required for use during ADL completion.   Goal status: INITIAL  Pt will decrease pain in RUE to 3/10 or less to improve ability to sleep for 2+ consecutive hours without waking due to pain.   Goal status: INITIAL  2.  Pt will decrease RUE fascial restrictions to min amounts or less to improve mobility required for functional reaching tasks.   Goal status: INITIAL  3.  Pt will increase RUE A/ROM by 15 degrees to improve ability to use RUE when reaching overhead or behind back during dressing and bathing tasks.   Goal status: INITIAL  4.  Pt will increase RUE strength to 5/5 or greater to improve ability to use RUE when lifting or carrying items during meal preparation/housework/yardwork tasks.   Goal status: INITIAL  5.  Pt will return to highest level of function using RUE as dominant during functional task completion.   Goal status:  INITIAL   ASSESSMENT:  CLINICAL IMPRESSION: Patient is a 47 y.o. female who was seen today for occupational therapy evaluation for RUE pain. Pt presents with increased pain and fascial restrictions, decreased ROM, strength, and functional use of the RUE.   PERFORMANCE DEFICITS: in functional skills including in functional skills including ADLs, IADLs, coordination, tone, ROM, strength, pain, fascial restrictions, muscle spasms, and UE functional use.  IMPAIRMENTS: are limiting patient from ADLs, IADLs, rest and sleep, work, leisure, and social participation.   COMORBIDITIES: has no other co-morbidities that affects occupational performance. Patient will benefit from skilled OT to address above impairments and improve overall function.  MODIFICATION OR ASSISTANCE TO COMPLETE EVALUATION: No modification of tasks or assist necessary to complete an evaluation.  OT OCCUPATIONAL PROFILE AND HISTORY: Problem focused assessment: Including review of records relating to presenting problem.  CLINICAL DECISION MAKING: LOW - limited treatment options, no task modification necessary  REHAB POTENTIAL: Good  EVALUATION COMPLEXITY: Low      PLAN:  OT FREQUENCY: 1-2x/week  OT DURATION: 4 weeks  PLANNED INTERVENTIONS: self care/ADL training, therapeutic exercise, therapeutic activity, neuromuscular re-education, manual therapy, passive range of motion, splinting, electrical stimulation, ultrasound, moist heat, cryotherapy, patient/family education, and DME and/or AE instructions  RECOMMENDED OTHER SERVICES: N/A  CONSULTED AND AGREED WITH PLAN OF CARE: Patient  PLAN FOR NEXT SESSION: Manual Therapy, A/ROM, Scapular strengthening, shoulder strengthening   Trish Mage, OTR/L [facility] [phone] Onell Mcmath Rosemarie Beath, OT 09/07/2022, 3:44 PM

## 2022-09-07 NOTE — Patient Instructions (Signed)

## 2022-09-14 ENCOUNTER — Encounter (HOSPITAL_COMMUNITY): Payer: BC Managed Care – PPO | Admitting: Occupational Therapy

## 2022-10-04 ENCOUNTER — Encounter (HOSPITAL_COMMUNITY): Payer: Self-pay | Admitting: Occupational Therapy

## 2022-10-04 ENCOUNTER — Ambulatory Visit: Payer: BC Managed Care – PPO | Admitting: Family Medicine

## 2022-10-04 ENCOUNTER — Ambulatory Visit (HOSPITAL_COMMUNITY): Payer: BC Managed Care – PPO | Attending: Internal Medicine | Admitting: Occupational Therapy

## 2022-10-04 DIAGNOSIS — G8929 Other chronic pain: Secondary | ICD-10-CM

## 2022-10-04 DIAGNOSIS — R29898 Other symptoms and signs involving the musculoskeletal system: Secondary | ICD-10-CM

## 2022-10-04 DIAGNOSIS — M25611 Stiffness of right shoulder, not elsewhere classified: Secondary | ICD-10-CM | POA: Diagnosis present

## 2022-10-04 DIAGNOSIS — M25511 Pain in right shoulder: Secondary | ICD-10-CM | POA: Diagnosis present

## 2022-10-04 NOTE — Therapy (Signed)
OUTPATIENT OCCUPATIONAL THERAPY ORTHO TREATMENT NOTE  Patient Name: Kara Gentry MRN: [id number] DOB:07-Dec-1975, 47 y.o., female Today's Date: 10/04/2022   END OF SESSION:   10/04/22 1730  OT Visits / Re-Eval  Visit Number 2  Number of Visits 5  Date for OT Re-Evaluation 10/19/22  Authorization  Authorization Type BCBS  OT Time Calculation  OT Start Time 1648  OT Stop Time 1729  OT Time Calculation (min) 41 min  End of Session  Activity Tolerance Patient tolerated treatment well  Behavior During Therapy [facility] for tasks assessed/performed    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   PONV (postoperative nausea and vomiting)    S/P ORIF (open reduction internal fixation) fracture 05/09/17 Ankle  Right     Seasonal allergies    Vitamin D deficiency 05/23/2016   Take 5000 IU vitamin D3 daily   Past Surgical History:  Procedure Laterality Date   APPLICATION OF WOUND VAC  01/03/2012   Procedure: APPLICATION OF WOUND VAC;  Surgeon: Tilda Burrow, MD;  [facility]: [facility];  Service: Gynecology;  Laterality: N/A;   APPLICATION OF WOUND VAC  01/05/2012   Procedure: APPLICATION OF WOUND VAC;  Surgeon: Tilda Burrow, MD;  [facility]: [facility];  Service: Gynecology;  Laterality: N/A;   APPLICATION OF WOUND VAC  01/07/2012   Procedure: APPLICATION OF WOUND VAC;  Surgeon: Tilda Burrow, MD;  [facility];  Service: Gynecology;;   APPLICATION OF WOUND VAC  01/11/2012   Procedure: APPLICATION OF WOUND VAC;  Surgeon: Tilda Burrow, MD;  [facility];  Service: Gynecology;  Laterality: N/A;  Fasicial Closure   CESAREAN SECTION  12/22/2011   Procedure: CESAREAN SECTION;  Surgeon: Reva Bores, MD;  Location: [facility];  Service:  Obstetrics;  Laterality: N/A;  Primary cesarean section with delivery of Baby A girl at 60. Baby B girl at 21.   CESAREAN SECTION N/A    Phreesia 07/26/2019   DILITATION & CURRETTAGE/HYSTROSCOPY WITH NOVASURE ABLATION N/A 09/04/2016   Procedure: HYSTEROSCOPY WITH NOVASURE ABLATION;  Surgeon: Tilda Burrow, MD;  [facility]: [facility];  Service: Gynecology;  Laterality: N/A;   ECTOPIC PREGNANCY SURGERY     FRACTURE SURGERY N/A    Phreesia 07/26/2019   INCISION AND DRAINAGE OF WOUND  01/07/2012   Procedure: IRRIGATION AND DEBRIDEMENT WOUND;  Surgeon: Tilda Burrow, MD;  [facility];  Service: Gynecology;;   INSERTION OF MESH  01/03/2012   Procedure: INSERTION OF MESH;  Surgeon: Tilda Burrow, MD;  [facility];  Service: Gynecology;  Laterality: N/A;   MANDIBLE SURGERY Right cancer   Cancerous mass   NASAL SINUS SURGERY     ORIF ANKLE FRACTURE Right 05/09/2017   Procedure: OPEN REDUCTION INTERNAL FIXATION (ORIF) ANKLE FRACTURE;  Surgeon: Vickki Hearing, MD;  [facility];  Service: Orthopedics;  Laterality: Right;   SECONDARY CLOSURE OF WOUND  01/22/2012   Procedure: SECONDARY CLOSURE OF WOUND;  Surgeon: Tilda Burrow, MD;  [facility];  Service: Gynecology;  Laterality: N/A;   WOUND EXPLORATION  01/03/2012   Necrotizing fascitis at C section incision   WOUND EXPLORATION  01/05/2012  Procedure: WOUND EXPLORATION;  Surgeon: Tilda Burrow, MD;  [facility];  Service: Gynecology;  Laterality: N/A;  Re-Exploration of Wound Abscess with Debridement   WOUND EXPLORATION  01/07/2012   Procedure: WOUND EXPLORATION;  Surgeon: Tilda Burrow, MD;  [facility];  Service: Gynecology;;   WOUND EXPLORATION  01/11/2012   Procedure: WOUND EXPLORATION;  Surgeon: Tilda Burrow, MD;  [facility];  Service: Gynecology;  Laterality: N/A;  Debridment of Wound Abscess   WOUND EXPLORATION  01/22/2012   Procedure: WOUND EXPLORATION;  Surgeon: Tilda Burrow, MD;   [facility];  Service: Gynecology;  Laterality: N/A;  Delayed Secondary Wound Closure   WRIST SURGERY Left    Cyst removal   Patient Active Problem List   Diagnosis Date Noted   Neck muscle spasm 08/26/2022   Chronic right shoulder pain 08/26/2022   Night sweats 04/03/2022   Fatigue 04/03/2022   Obesity, Class II, BMI 35-39.9, 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 01/01/2022   Migraine 09/29/2021   Hx of migraines 07/30/2019   Pelvic pain in female 12/19/2016   Menorrhagia with regular cycle 09/04/2016   Fibroids 05/31/2016   Vitamin D deficiency 05/23/2016   Pelvic pain 05/21/2016    PCP: Gilmore Laroche, NP REFERRING PROVIDER: Albertha Ghee, MD  ONSET DATE: ~3 weeks  REFERRING DIAG: Chronic R shoulder Pain  THERAPY DIAG:  No diagnosis found.  Rationale for Evaluation and Treatment: Rehabilitation  SUBJECTIVE:   SUBJECTIVE STATEMENT: "It hasn't been too bad lately" Pt accompanied by: self  PERTINENT HISTORY: Past medical history of vasomotor symptoms and migraines   PRECAUTIONS: None  WEIGHT BEARING RESTRICTIONS: No  PAIN:  Are you having pain? Yes: NPRS scale: 2/10 Pain location: top of the shoulder and trapezius Pain description: soreness and aching Aggravating factors: sleeping Relieving factors: changing position  FALLS: Has patient fallen in last 6 months? No  PLOF: Independent  PATIENT GOALS: "To get the pain to stop"  NEXT MD VISIT: 10/04/22  OBJECTIVE:   HAND DOMINANCE: Right  ADLs: Overall ADLs: Pt has limitations with ADL's and IADL's due to increased pain and seizing/grabbing sensation, limiting ROM. Additionally this affects her sleep due to increased pain at night.   FUNCTIONAL OUTCOME MEASURES: FOTO: Will Complete Next Session  UPPER EXTREMITY ROM:       Assessed in seated, er/IR adducted  Active ROM Right eval  Shoulder flexion 156  Shoulder abduction 167  Shoulder internal rotation 90  Shoulder external rotation 36  (Blank rows = not  tested)    UPPER EXTREMITY MMT:     Assessed in seated, er/IR adducted  MMT Right eval  Shoulder flexion 5/5  Shoulder abduction 4+/5  Shoulder internal rotation 4+/5  Shoulder external rotation 4+/5  (Blank rows = not tested)  SENSATION: WFL  EDEMA: No swelling noted  OBSERVATIONS: moderate fascial restrictions noted in the trapezius, scapularis, pectoralis, deltoid.   TODAY'S TREATMENT:  DATE:   10/04/22 -manual therapy: myofascial release and trigger point applied to biceps, deltoid, scapular region, and trapezius in order to reduce fascial restrictions and pain, to improve ROM. -A/ROM: Seated, flexion, abduction, protraction, horizontal abduction, er/IR, x15 -Scapular strengthening: green band, extension, retraction, rows, x15 -Shoulder strengthening: green band, flexion, abduction, horizontal abduction, protraction, er, IR, x15 -Overhead lacing    PATIENT EDUCATION: Education details: shoulder strengthening and scapular strengthening Person educated: Patient Education method: Explanation, Demonstration, and Handouts Education comprehension: verbalized understanding and returned demonstration  HOME EXERCISE PROGRAM: 7/19: A/ROM 8/15: shoulder strengthening and scapular strengthening  GOALS: Goals reviewed with patient? Yes   SHORT TERM GOALS: Target date: 10/19/22  Pt will be provided with and educated on HEP to improve mobility in RUE required for use during ADL completion.   Goal status: IN PROGRESS  Pt will decrease pain in RUE to 3/10 or less to improve ability to sleep for 2+ consecutive hours without waking due to pain.   Goal status: IN PROGRESS  2.  Pt will decrease RUE fascial restrictions to min amounts or less to improve mobility required for functional reaching tasks.   Goal status: IN PROGRESS  3.  Pt will increase  RUE A/ROM by 15 degrees to improve ability to use RUE when reaching overhead or behind back during dressing and bathing tasks.   Goal status: IN PROGRESS  4.  Pt will increase RUE strength to 5/5 or greater to improve ability to use RUE when lifting or carrying items during meal preparation/housework/yardwork tasks.   Goal status: IN PROGRESS  5.  Pt will return to highest level of function using RUE as dominant during functional task completion.   Goal status: IN PROGRESS   ASSESSMENT:  CLINICAL IMPRESSION: This session, pt presented with improved ROM and strength, with minimal pain. She was able to tolerate starting on resistance bands with only 1 rest break after completing the entire set of exercises. Pt had increased fatigue with overhead lacing. Verbal and tactile cuing provided throughout session for positioning and technique.   PERFORMANCE DEFICITS: in functional skills including in functional skills including ADLs, IADLs, coordination, tone, ROM, strength, pain, fascial restrictions, muscle spasms, and UE functional use.  PLAN:  OT FREQUENCY: 1-2x/week  OT DURATION: 4 weeks  PLANNED INTERVENTIONS: self care/ADL training, therapeutic exercise, therapeutic activity, neuromuscular re-education, manual therapy, passive range of motion, splinting, electrical stimulation, ultrasound, moist heat, cryotherapy, patient/family education, and DME and/or AE instructions  RECOMMENDED OTHER SERVICES: N/A  CONSULTED AND AGREED WITH PLAN OF CARE: Patient  PLAN FOR NEXT SESSION: Manual Therapy, A/ROM, Scapular strengthening, shoulder strengthening   Trish Mage, OTR/L [facility] [phone] Scottlyn Mchaney Rosemarie Beath, OT 10/04/2022, 4:55 PM

## 2022-10-04 NOTE — Patient Instructions (Signed)
1) (Home) Extension: Isometric / Bilateral Arm Retraction - Sitting ? ? ?Facing anchor, hold hands and elbow at shoulder height, with elbow bent.  Pull arms back to squeeze shoulder blades together. Repeat 10-15 times. 1-3 times/day.  ? ?2) (Clinic) Extension / Flexion (Assist) ? ? ?Face anchor, pull arms back, keeping elbow straight, and squeze shoulder blades together. ?Repeat 10-15 times. 1-3 times/day.  ? ?Copyright ? VHI. All rights reserved.  ? ?3) (Home) Retraction: Row - Bilateral (Anchor) ? ? ?Facing anchor, arms reaching forward, pull hands toward stomach, keeping elbows bent and at your sides and pinching shoulder blades together. ?Repeat 10-15 times. 1-3 times/day.  ? ?Copyright ? VHI. All rights reserved.  ? ? ? ? ?Theraband strengthening: Complete 10-15X, 1-2X/day ? ?1) Shoulder protraction ? ?Anchor band in doorway, stand with back to door. Push your hand forward as much as you can to bringing your shoulder blades forward on your rib cage. ? ? ? ? ? ?2) Shoulder horizontal abduction ? ?Standing with a theraband anchored at chest height, begin with arm straight and some tension in the band. Move your arm out to your side (keeping straight the whole time). Bring the affected arm back to midline. ? ? ? ? ?3) Shoulder Internal Rotation ? ?While holding an elastic band at your side with your elbow bent, start with your hand away from your stomach, then pull the band towards your stomach. Keep your elbow near your side the entire time. ? ? ? ? ?4) Shoulder External Rotation ? ?While holding an elastic band at your side with your elbow bent, start with your hand near your stomach and then pull the band away. Keep your elbow at your side the entire time. ? ? ? ? ?5) Shoulder flexion ? ?While standing with back to the door, holding Theraband at hand level, raise arm in front of you.  Keep elbow straight through entire movement.  ? ? ? ? ?6) Shoulder abduction ? ?While holding an elastic band at your side, draw  up your arm to the side keeping your elbow straight. ?  ? ?

## 2022-10-12 ENCOUNTER — Encounter (HOSPITAL_COMMUNITY): Payer: BC Managed Care – PPO | Admitting: Occupational Therapy

## 2022-10-12 ENCOUNTER — Telehealth (HOSPITAL_COMMUNITY): Payer: Self-pay | Admitting: Occupational Therapy

## 2022-10-12 NOTE — Telephone Encounter (Signed)
This OT spoke with pt regarding her missed appointment on 10/12/22 at 2:30pm. Pt reported that she called the front office and left a message stating that she would not be able to make it. OT provided a reminder that her next visit is scheduled for 10/16/22 at 4:45pm.   Trish Mage, OTR/L [facility] [phone]

## 2022-10-16 ENCOUNTER — Encounter (HOSPITAL_COMMUNITY): Payer: BC Managed Care – PPO | Admitting: Occupational Therapy

## 2022-10-17 ENCOUNTER — Encounter (HOSPITAL_COMMUNITY): Payer: BC Managed Care – PPO | Admitting: Occupational Therapy

## 2022-10-18 ENCOUNTER — Ambulatory Visit (HOSPITAL_COMMUNITY): Payer: BC Managed Care – PPO | Admitting: Occupational Therapy

## 2022-10-18 ENCOUNTER — Encounter (HOSPITAL_COMMUNITY): Payer: Self-pay | Admitting: Occupational Therapy

## 2022-10-18 DIAGNOSIS — R29898 Other symptoms and signs involving the musculoskeletal system: Secondary | ICD-10-CM

## 2022-10-18 DIAGNOSIS — G8929 Other chronic pain: Secondary | ICD-10-CM

## 2022-10-18 DIAGNOSIS — M25611 Stiffness of right shoulder, not elsewhere classified: Secondary | ICD-10-CM

## 2022-10-18 DIAGNOSIS — M25511 Pain in right shoulder: Secondary | ICD-10-CM | POA: Diagnosis not present

## 2022-10-18 NOTE — Therapy (Signed)
OUTPATIENT OCCUPATIONAL THERAPY ORTHO TREATMENT NOTE REASSESSMENT/RECERTIFICATION  Patient Name: Kara Gentry MRN: [id number] DOB:Jan 04, 1976, 47 y.o., female Today's Date: 10/18/2022   END OF SESSION:  OT End of Session - 10/18/22 0858     Visit Number 3    Number of Visits 5    Date for OT Re-Evaluation 11/09/22    Authorization Type BCBS    OT Start Time 0734    OT Stop Time 0816    OT Time Calculation (min) 42 min    Activity Tolerance Patient tolerated treatment well    Behavior During Therapy [facility] for tasks assessed/performed             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   PONV (postoperative nausea and vomiting)    S/P ORIF (open reduction internal fixation) fracture 05/09/17 Ankle  Right     Seasonal allergies    Vitamin D deficiency 05/23/2016   Take 5000 IU vitamin D3 daily   Past Surgical History:  Procedure Laterality Date   APPLICATION OF WOUND VAC  01/03/2012   Procedure: APPLICATION OF WOUND VAC;  Surgeon: Tilda Burrow, MD;  [facility]: [facility];  Service: Gynecology;  Laterality: N/A;   APPLICATION OF WOUND VAC  01/05/2012   Procedure: APPLICATION OF WOUND VAC;  Surgeon: Tilda Burrow, MD;  [facility]: [facility];  Service: Gynecology;  Laterality: N/A;   APPLICATION OF WOUND VAC  01/07/2012   Procedure: APPLICATION OF WOUND VAC;  Surgeon: Tilda Burrow, MD;  [facility]: [facility];  Service: Gynecology;;   APPLICATION OF WOUND VAC  01/11/2012   Procedure: APPLICATION OF WOUND VAC;  Surgeon: Tilda Burrow, MD;  [facility]: [facility];  Service: Gynecology;  Laterality: N/A;  Fasicial Closure   CESAREAN SECTION  12/22/2011   Procedure: CESAREAN SECTION;  Surgeon: Reva Bores, MD;  Location: [facility];  Service:  Obstetrics;  Laterality: N/A;  Primary cesarean section with delivery of Baby A girl at 77. Baby B girl at 7.   CESAREAN SECTION N/A    Phreesia 07/26/2019   DILITATION & CURRETTAGE/HYSTROSCOPY WITH NOVASURE ABLATION N/A 09/04/2016   Procedure: HYSTEROSCOPY WITH NOVASURE ABLATION;  Surgeon: Tilda Burrow, MD;  [facility]: [facility];  Service: Gynecology;  Laterality: N/A;   ECTOPIC PREGNANCY SURGERY     FRACTURE SURGERY N/A    Phreesia 07/26/2019   INCISION AND DRAINAGE OF WOUND  01/07/2012   Procedure: IRRIGATION AND DEBRIDEMENT WOUND;  Surgeon: Tilda Burrow, MD;  [facility]: [facility];  Service: Gynecology;;   INSERTION OF MESH  01/03/2012   Procedure: INSERTION OF MESH;  Surgeon: Tilda Burrow, MD;  [facility]: [facility];  Service: Gynecology;  Laterality: N/A;   MANDIBLE SURGERY Right cancer   Cancerous mass   NASAL SINUS SURGERY     ORIF ANKLE FRACTURE Right 05/09/2017   Procedure: OPEN REDUCTION INTERNAL FIXATION (ORIF) ANKLE FRACTURE;  Surgeon: Vickki Hearing, MD;  [facility]: [facility];  Service: Orthopedics;  Laterality: Right;   SECONDARY CLOSURE OF WOUND  01/22/2012   Procedure: SECONDARY CLOSURE OF WOUND;  Surgeon: Tilda Burrow, MD;  [facility]: [facility];  Service: Gynecology;  Laterality: N/A;   WOUND EXPLORATION  01/03/2012   Necrotizing fascitis at C section incision   WOUND EXPLORATION  01/05/2012   Procedure: WOUND EXPLORATION;  Surgeon: Tilda Burrow, MD;  [facility]: [facility];  Service: Gynecology;  Laterality: N/A;  Re-Exploration of Wound Abscess with Debridement   WOUND EXPLORATION  01/07/2012   Procedure: WOUND EXPLORATION;  Surgeon: Tilda Burrow, MD;  [facility]: [facility];  Service: Gynecology;;   WOUND EXPLORATION  01/11/2012   Procedure: WOUND EXPLORATION;  Surgeon: Tilda Burrow, MD;  [facility]: [facility];  Service: Gynecology;  Laterality: N/A;  Debridment of Wound Abscess   WOUND EXPLORATION  01/22/2012   Procedure: WOUND EXPLORATION;  Surgeon: Tilda Burrow, MD;   [facility]: [facility];  Service: Gynecology;  Laterality: N/A;  Delayed Secondary Wound Closure   WRIST SURGERY Left    Cyst removal   Patient Active Problem List   Diagnosis Date Noted   Neck muscle spasm 08/26/2022   Chronic right shoulder pain 08/26/2022   Night sweats 04/03/2022   Fatigue 04/03/2022   Obesity, Class II, BMI 35-39.9, 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 01/01/2022   Migraine 09/29/2021   Hx of migraines 07/30/2019   Pelvic pain in female 12/19/2016   Menorrhagia with regular cycle 09/04/2016   Fibroids 05/31/2016   Vitamin D deficiency 05/23/2016   Pelvic pain 05/21/2016    PCP: Gilmore Laroche, NP REFERRING PROVIDER: Albertha Ghee, MD  ONSET DATE: ~3 weeks  REFERRING DIAG: Chronic R shoulder Pain  THERAPY DIAG:  Chronic right shoulder pain  Shoulder stiffness, right  Other symptoms and signs involving the musculoskeletal system  Rationale for Evaluation and Treatment: Rehabilitation  SUBJECTIVE:   SUBJECTIVE STATEMENT: "I'm still having pain at night" Pt accompanied by: self  PERTINENT HISTORY: Past medical history of vasomotor symptoms and migraines   PRECAUTIONS: None  WEIGHT BEARING RESTRICTIONS: No  PAIN:  Are you having pain? Yes: NPRS scale: 4/10 Pain location: top of the shoulder and trapezius Pain description: soreness and aching Aggravating factors: sleeping Relieving factors: changing position  FALLS: Has patient fallen in last 6 months? No  PLOF: Independent  PATIENT GOALS: "To get the pain to stop"  NEXT MD VISIT: 10/04/22  OBJECTIVE:   HAND DOMINANCE: Right  ADLs: Overall ADLs: Pt has limitations with ADL's and IADL's due to increased pain and seizing/grabbing sensation, limiting ROM. Additionally this affects her sleep due to increased pain at night.   FUNCTIONAL OUTCOME MEASURES: FOTO: 70.12  UPPER EXTREMITY ROM:       Assessed in seated, er/IR adducted  Active ROM Right eval Right 10/18/22  Shoulder flexion 156 158  Shoulder  abduction 167 169  Shoulder internal rotation 90 90  Shoulder external rotation 36 72  (Blank rows = not tested)    UPPER EXTREMITY MMT:     Assessed in seated, er/IR adducted  MMT Right eval Right 10/18/22  Shoulder flexion 5/5 5/5  Shoulder abduction 4+/5 4+/5  Shoulder internal rotation 4+/5 5/5  Shoulder external rotation 4+/5 4+/5  (Blank rows = not tested)  SENSATION: WFL  EDEMA: No swelling noted  OBSERVATIONS: moderate fascial restrictions noted in the trapezius, scapularis, pectoralis, deltoid.   TODAY'S TREATMENT:  DATE:   10/18/22 -manual therapy: myofascial release and trigger point applied to biceps, deltoid, scapular region, and trapezius in order to reduce fascial restrictions and pain, to improve ROM. -A/ROM: Seated, flexion, abduction, protraction, horizontal abduction, er/IR, x15 -PNF Strengthening: green band, chest pulls, head height pulls, er pulls, PNF up, PNF down, x10 -Measurements for reassessment  10/04/22 -manual therapy: myofascial release and trigger point applied to biceps, deltoid, scapular region, and trapezius in order to reduce fascial restrictions and pain, to improve ROM. -A/ROM: Seated, flexion, abduction, protraction, horizontal abduction, er/IR, x15 -Scapular strengthening: green band, extension, retraction, rows, x15 -Shoulder strengthening: green band, flexion, abduction, horizontal abduction, protraction, er, IR, x15 -Overhead lacing    PATIENT EDUCATION: Education details: PNF Strengthening Person educated: Patient Education method: Explanation, Demonstration, and Handouts Education comprehension: verbalized understanding and returned demonstration  HOME EXERCISE PROGRAM: 7/19: A/ROM 8/15: shoulder strengthening and scapular strengthening 8/29: PNF strengthening  GOALS: Goals reviewed with patient?  Yes   SHORT TERM GOALS: Target date: 10/19/22  Pt will be provided with and educated on HEP to improve mobility in RUE required for use during ADL completion.   Goal status: IN PROGRESS  Pt will decrease pain in RUE to 3/10 or less to improve ability to sleep for 2+ consecutive hours without waking due to pain.   Goal status: IN PROGRESS  2.  Pt will decrease RUE fascial restrictions to min amounts or less to improve mobility required for functional reaching tasks.   Goal status: IN PROGRESS  3.  Pt will increase RUE A/ROM by 15 degrees to improve ability to use RUE when reaching overhead or behind back during dressing and bathing tasks.   Goal status: IN PROGRESS  4.  Pt will increase RUE strength to 5/5 or greater to improve ability to use RUE when lifting or carrying items during meal preparation/housework/yardwork tasks.   Goal status: IN PROGRESS  5.  Pt will return to highest level of function using RUE as dominant during functional task completion.   Goal status: IN PROGRESS   ASSESSMENT:  CLINICAL IMPRESSION: Pt completed reassessment this session, where she demonstrated improving ROM and strength. She continues to have moderate to severe fascial restrictions along her deltoid and biceps, addressed with manual therapy. Pt reports that pain still gets worse at night and she continues to have intermittent spasms along the trapezius. OT recommending pt continue therapy for 2 more sessions at 1 time a week for continued strengthening. Verbal cuing provided for positioning and technique throughout session.   PERFORMANCE DEFICITS: in functional skills including in functional skills including ADLs, IADLs, coordination, tone, ROM, strength, pain, fascial restrictions, muscle spasms, and UE functional use.  PLAN:  OT FREQUENCY: 1-2x/week  OT DURATION: 4 weeks  PLANNED INTERVENTIONS: self care/ADL training, therapeutic exercise, therapeutic activity, neuromuscular  re-education, manual therapy, passive range of motion, splinting, electrical stimulation, ultrasound, moist heat, cryotherapy, patient/family education, and DME and/or AE instructions  RECOMMENDED OTHER SERVICES: N/A  CONSULTED AND AGREED WITH PLAN OF CARE: Patient  PLAN FOR NEXT SESSION: Manual Therapy, A/ROM, Scapular strengthening, shoulder strengthening   Trish Mage, OTR/L [facility] [phone] Lenita Peregrina Rosemarie Beath, OT 10/18/2022, 8:59 AM

## 2022-10-18 NOTE — Patient Instructions (Signed)

## 2022-10-25 LAB — HM PAP SMEAR: HM Pap smear: NORMAL

## 2022-10-26 ENCOUNTER — Encounter (HOSPITAL_COMMUNITY): Payer: Self-pay | Admitting: Occupational Therapy

## 2022-10-26 ENCOUNTER — Ambulatory Visit (HOSPITAL_COMMUNITY): Payer: BC Managed Care – PPO | Attending: Internal Medicine | Admitting: Occupational Therapy

## 2022-10-26 DIAGNOSIS — R29898 Other symptoms and signs involving the musculoskeletal system: Secondary | ICD-10-CM | POA: Diagnosis present

## 2022-10-26 DIAGNOSIS — G8929 Other chronic pain: Secondary | ICD-10-CM | POA: Insufficient documentation

## 2022-10-26 DIAGNOSIS — M25511 Pain in right shoulder: Secondary | ICD-10-CM | POA: Insufficient documentation

## 2022-10-26 DIAGNOSIS — M25611 Stiffness of right shoulder, not elsewhere classified: Secondary | ICD-10-CM | POA: Insufficient documentation

## 2022-10-26 NOTE — Patient Instructions (Signed)

## 2022-10-26 NOTE — Therapy (Signed)
OUTPATIENT OCCUPATIONAL THERAPY ORTHO TREATMENT NOTE   Patient Name: Kara Gentry MRN: [id number] DOB:11-30-1975, 47 y.o., female Today's Date: 10/26/2022   END OF SESSION:  OT End of Session - 10/26/22 0817     Visit Number 4    Number of Visits 5    Date for OT Re-Evaluation 11/09/22    Authorization Type [employer]    OT Start Time 0737    OT Stop Time 0816    OT Time Calculation (min) 39 min    Activity Tolerance Patient tolerated treatment well    Behavior During Therapy  [facility] for tasks assessed/performed              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   PONV (postoperative nausea and vomiting)    S/P ORIF (open reduction internal fixation) fracture 05/09/17 Ankle  Right     Seasonal allergies    Vitamin D deficiency 05/23/2016   Take 5000 IU vitamin D3 daily   Past Surgical History:  Procedure Laterality Date   APPLICATION OF WOUND VAC  01/03/2012   Procedure: APPLICATION OF WOUND VAC;  Surgeon: Tilda Burrow, MD;  [facility]: [facility];  Service: Gynecology;  Laterality: N/A;   APPLICATION OF WOUND VAC  01/05/2012   Procedure: APPLICATION OF WOUND VAC;  Surgeon: Tilda Burrow, MD;  Location: [facility];  Service: Gynecology;  Laterality: N/A;   APPLICATION OF WOUND VAC  01/07/2012   Procedure: APPLICATION OF WOUND VAC;  Surgeon: Tilda Burrow, MD;  Location: [facility];  Service: Gynecology;;   APPLICATION OF WOUND VAC  01/11/2012   Procedure: APPLICATION OF WOUND VAC;  Surgeon: Tilda Burrow, MD;  [facility];  Service: Gynecology;  Laterality: N/A;  Fasicial Closure   CESAREAN SECTION  12/22/2011   Procedure: CESAREAN SECTION;  Surgeon: Reva Bores, MD;  Location: [facility];  Service: Obstetrics;  Laterality: N/A;   Primary cesarean section with delivery of Baby A girl at 28. Baby B girl at 45.   CESAREAN SECTION N/A    Phreesia 07/26/2019   DILITATION & CURRETTAGE/HYSTROSCOPY WITH NOVASURE ABLATION N/A 09/04/2016   Procedure: HYSTEROSCOPY WITH NOVASURE ABLATION;  Surgeon: Tilda Burrow, MD;  Location: [facility];  Service: Gynecology;  Laterality: N/A;   ECTOPIC PREGNANCY SURGERY     FRACTURE SURGERY N/A    Phreesia 07/26/2019   INCISION AND DRAINAGE OF WOUND  01/07/2012   Procedure: IRRIGATION AND DEBRIDEMENT WOUND;  Surgeon: Tilda Burrow, MD;  Location: [facility];  Service: Gynecology;;   INSERTION OF MESH  01/03/2012   Procedure: INSERTION OF MESH;  Surgeon: Tilda Burrow, MD;  [facility]: [facility];  Service: Gynecology;  Laterality: N/A;   MANDIBLE SURGERY Right cancer   Cancerous mass   NASAL SINUS SURGERY     ORIF ANKLE FRACTURE Right 05/09/2017   Procedure: OPEN REDUCTION INTERNAL FIXATION (ORIF) ANKLE FRACTURE;  Surgeon: Vickki Hearing, MD;  Location: [facility];  Service: Orthopedics;  Laterality: Right;   SECONDARY CLOSURE OF WOUND  01/22/2012   Procedure: SECONDARY CLOSURE OF WOUND;  Surgeon: Tilda Burrow, MD;  Location: [facility];  Service: Gynecology;  Laterality: N/A;   WOUND  EXPLORATION  01/03/2012   Necrotizing fascitis at C section incision   WOUND EXPLORATION  01/05/2012   Procedure: WOUND EXPLORATION;  Surgeon: Tilda Burrow, MD;  [facility]: [facility];  Service: Gynecology;  Laterality: N/A;  Re-Exploration of Wound Abscess with Debridement   WOUND EXPLORATION  01/07/2012   Procedure: WOUND EXPLORATION;  Surgeon: Tilda Burrow, MD;  [facility]: [facility];  Service: Gynecology;;   WOUND EXPLORATION  01/11/2012   Procedure: WOUND EXPLORATION;  Surgeon: Tilda Burrow, MD;  [facility]: [facility];  Service: Gynecology;  Laterality: N/A;  Debridment of Wound Abscess   WOUND EXPLORATION  01/22/2012   Procedure: WOUND EXPLORATION;  Surgeon: Tilda Burrow, MD;  [facility]: [facility];  Service:  Gynecology;  Laterality: N/A;  Delayed Secondary Wound Closure   WRIST SURGERY Left    Cyst removal   Patient Active Problem List   Diagnosis Date Noted   Neck muscle spasm 08/26/2022   Chronic right shoulder pain 08/26/2022   Night sweats 04/03/2022   Fatigue 04/03/2022   Obesity, Class II, BMI 35-39.9, 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 01/01/2022   Migraine 09/29/2021   Hx of migraines 07/30/2019   Pelvic pain in female 12/19/2016   Menorrhagia with regular cycle 09/04/2016   Fibroids 05/31/2016   Vitamin D deficiency 05/23/2016   Pelvic pain 05/21/2016    PCP: Gilmore Laroche, NP REFERRING PROVIDER: Albertha Ghee, MD  ONSET DATE: ~3 weeks  REFERRING DIAG: Chronic R shoulder Pain  THERAPY DIAG:  Chronic right shoulder pain  Shoulder stiffness, right  Other symptoms and signs involving the musculoskeletal system  Rationale for Evaluation and Treatment: Rehabilitation  SUBJECTIVE:   SUBJECTIVE STATEMENT: "I'm still having pain at night" Pt accompanied by: self  PERTINENT HISTORY: Past medical history of vasomotor symptoms and migraines   PRECAUTIONS: None  WEIGHT BEARING RESTRICTIONS: No  PAIN:  Are you having pain? Yes: NPRS scale: 4/10 Pain location: top of the shoulder and trapezius Pain description: soreness and aching Aggravating factors: sleeping Relieving factors: changing position  FALLS: Has patient fallen in last 6 months? No  PLOF: Independent  PATIENT GOALS: "To get the pain to stop"  NEXT MD VISIT: 10/04/22  OBJECTIVE:   HAND DOMINANCE: Right  ADLs: Overall ADLs: Pt has limitations with ADL's and IADL's due to increased pain and seizing/grabbing sensation, limiting ROM. Additionally this affects her sleep due to increased pain at night.   FUNCTIONAL OUTCOME MEASURES: FOTO: 70.12  UPPER EXTREMITY ROM:       Assessed in seated, er/IR adducted  Active ROM Right eval Right 10/18/22  Shoulder flexion 156 158  Shoulder abduction 167 169  Shoulder  internal rotation 90 90  Shoulder external rotation 36 72  (Blank rows = not tested)    UPPER EXTREMITY MMT:     Assessed in seated, er/IR adducted  MMT Right eval Right 10/18/22  Shoulder flexion 5/5 5/5  Shoulder abduction 4+/5 4+/5  Shoulder internal rotation 4+/5 5/5  Shoulder external rotation 4+/5 4+/5  (Blank rows = not tested)  SENSATION: WFL  EDEMA: No swelling noted  OBSERVATIONS: moderate fascial restrictions noted in the trapezius, scapularis, pectoralis, deltoid.   TODAY'S TREATMENT:  DATE:   10/26/22 -manual therapy: myofascial release and trigger point applied to biceps, deltoid, scapular region, and trapezius in order to reduce fascial restrictions and pain, to improve ROM. -A/ROM: Seated, flexion, abduction, protraction, horizontal abduction, er/IR, x15 -Proximal shoulder exercises: paddles, criss cross, circles both directions, 2x12 -Isometrics: flexion, extension, abduction, ir, 4x15" -ABC's on the wall -Scapular strengthening: green band, extension, retraction, rows, x15 -Shoulder strengthening: green band, flexion, abduction, horizontal abduction, protraction, er, IR, x15 -UBE: level 2, 2.5 mins forwards and backwards  10/18/22 -manual therapy: myofascial release and trigger point applied to biceps, deltoid, scapular region, and trapezius in order to reduce fascial restrictions and pain, to improve ROM. -A/ROM: Seated, flexion, abduction, protraction, horizontal abduction, er/IR, x15 -PNF Strengthening: green band, chest pulls, head height pulls, er pulls, PNF up, PNF down, x10 -Measurements for reassessment  10/04/22 -manual therapy: myofascial release and trigger point applied to biceps, deltoid, scapular region, and trapezius in order to reduce fascial restrictions and pain, to improve ROM. -A/ROM: Seated, flexion, abduction,  protraction, horizontal abduction, er/IR, x15 -Scapular strengthening: green band, extension, retraction, rows, x15 -Shoulder strengthening: green band, flexion, abduction, horizontal abduction, protraction, er, IR, x15 -Overhead lacing    PATIENT EDUCATION: Education details: Isometrics Person educated: Patient Education method: Programmer, multimedia, Facilities manager, and Handouts Education comprehension: verbalized understanding and returned demonstration  HOME EXERCISE PROGRAM: 7/19: A/ROM 8/15: shoulder strengthening and scapular strengthening 8/29: PNF strengthening 9/6: Isometrics  GOALS: Goals reviewed with patient? Yes   SHORT TERM GOALS: Target date: 10/19/22  Pt will be provided with and educated on HEP to improve mobility in RUE required for use during ADL completion.   Goal status: IN PROGRESS  Pt will decrease pain in RUE to 3/10 or less to improve ability to sleep for 2+ consecutive hours without waking due to pain.   Goal status: IN PROGRESS  2.  Pt will decrease RUE fascial restrictions to min amounts or less to improve mobility required for functional reaching tasks.   Goal status: IN PROGRESS  3.  Pt will increase RUE A/ROM by 15 degrees to improve ability to use RUE when reaching overhead or behind back during dressing and bathing tasks.   Goal status: IN PROGRESS  4.  Pt will increase RUE strength to 5/5 or greater to improve ability to use RUE when lifting or carrying items during meal preparation/housework/yardwork tasks.   Goal status: IN PROGRESS  5.  Pt will return to highest level of function using RUE as dominant during functional task completion.   Goal status: IN PROGRESS   ASSESSMENT:  CLINICAL IMPRESSION: This session, pt continuing to report pain and discomfort intermittently each day. OT added proximal shoulder exercises and isometrics this session for joint strengthening and stability, which pt reported has provided some relief in pain. Pt  only requiring short rest breaks this session during 3 exercises due to muscle fatigue. OT providing verbal and tactile cuing for positioning and technique.   PERFORMANCE DEFICITS: in functional skills including in functional skills including ADLs, IADLs, coordination, tone, ROM, strength, pain, fascial restrictions, muscle spasms, and UE functional use.  PLAN:  OT FREQUENCY: 1-2x/week  OT DURATION: 4 weeks  PLANNED INTERVENTIONS: self care/ADL training, therapeutic exercise, therapeutic activity, neuromuscular re-education, manual therapy, passive range of motion, splinting, electrical stimulation, ultrasound, moist heat, cryotherapy, patient/family education, and DME and/or AE instructions  RECOMMENDED OTHER SERVICES: N/A  CONSULTED AND AGREED WITH PLAN OF CARE: Patient  PLAN FOR NEXT SESSION: Manual Therapy, A/ROM, Scapular strengthening, shoulder strengthening  Trish Mage, OTR/L [facility] [phone] Kennyth Arnold, OT 10/26/2022, 8:19 AM

## 2022-10-31 ENCOUNTER — Ambulatory Visit (HOSPITAL_COMMUNITY)
Admission: RE | Admit: 2022-10-31 | Discharge: 2022-10-31 | Disposition: A | Discharge status: Home / Self Care | Payer: BC Managed Care – PPO | Source: Ambulatory Visit | Attending: Family Medicine | Admitting: Family Medicine

## 2022-10-31 ENCOUNTER — Encounter (HOSPITAL_COMMUNITY): Payer: Self-pay

## 2022-10-31 ENCOUNTER — Other Ambulatory Visit (HOSPITAL_COMMUNITY): Payer: Self-pay | Admitting: Family Medicine

## 2022-10-31 DIAGNOSIS — N63 Unspecified lump in unspecified breast: Secondary | ICD-10-CM

## 2022-11-02 ENCOUNTER — Encounter (HOSPITAL_COMMUNITY): Payer: Self-pay | Admitting: Occupational Therapy

## 2022-11-02 ENCOUNTER — Ambulatory Visit (HOSPITAL_COMMUNITY): Payer: BC Managed Care – PPO | Admitting: Occupational Therapy

## 2022-11-02 DIAGNOSIS — G8929 Other chronic pain: Secondary | ICD-10-CM

## 2022-11-02 DIAGNOSIS — R29898 Other symptoms and signs involving the musculoskeletal system: Secondary | ICD-10-CM

## 2022-11-02 DIAGNOSIS — M25611 Stiffness of right shoulder, not elsewhere classified: Secondary | ICD-10-CM

## 2022-11-02 DIAGNOSIS — M25511 Pain in right shoulder: Secondary | ICD-10-CM | POA: Diagnosis not present

## 2022-11-02 NOTE — Patient Instructions (Signed)
Complete _______ repetitions each. Complete _______ time a day.  Wall taps with theraband  With a looped elastic band around forearms/wrists and arms at a 90 angle pressed against the wall. Keeping elbows on the wall, tap right arm out to the right. Hold for 1 second. Return right arm back to neutral. Repeat with left arm.    Wall V slides with Theraband  Place a band loop around hands/forearms and face a wall. Extend both arms diagonally into a V shape on the wall. Hold this stretch for specified amount of time. Lower arms slowly back into neutral position. Repeat.       scap clocks  Tie a loop with a theraband and place around your wrists.  Stand with a wall in front of you.  Picture a clock in front of you.  Place both palms on the wall, arms straight.  While keeping the left/right hand planted, use the right/left hand to pull away and tap each number (1, 3, 5- right OR 11,9,7 left), coming back to center each time.

## 2022-11-02 NOTE — Therapy (Unsigned)
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   PONV (postoperative nausea and vomiting)    S/P ORIF (open reduction internal fixation) fracture 05/09/17 Ankle  Right     Seasonal allergies    Vitamin D deficiency 05/23/2016   Take 5000 IU vitamin D3 daily   Past Surgical History:  Procedure Laterality Date   APPLICATION OF WOUND VAC  01/03/2012   Procedure: APPLICATION OF WOUND VAC;  Surgeon: Tilda Burrow, MD;  [facility]: [facility];  Service: Gynecology;  Laterality: N/A;   APPLICATION OF WOUND VAC  01/05/2012   Procedure: APPLICATION OF WOUND VAC;  Surgeon: Tilda Burrow, MD;  Location: [facility];  Service: Gynecology;  Laterality: N/A;   APPLICATION OF WOUND VAC  01/07/2012   Procedure: APPLICATION OF WOUND VAC;  Surgeon: Tilda Burrow, MD;  [facility]: [facility];  Service: Gynecology;;   APPLICATION OF WOUND VAC  01/11/2012   Procedure: APPLICATION OF WOUND VAC;  Surgeon: Tilda Burrow, MD;  [facility]: [facility];  Service: Gynecology;  Laterality: N/A;  Fasicial Closure   CESAREAN SECTION  12/22/2011   Procedure: CESAREAN SECTION;  Surgeon: Reva Bores, MD;  Location: [facility];  Service: Obstetrics;  Laterality: N/A;  Primary cesarean section with delivery of Baby A girl at 77. Baby B girl at 34.   CESAREAN SECTION N/A    Phreesia 07/26/2019   DILITATION & CURRETTAGE/HYSTROSCOPY WITH NOVASURE ABLATION N/A 09/04/2016   Procedure: HYSTEROSCOPY WITH NOVASURE ABLATION;  Surgeon: Tilda Burrow, MD;  Location: [facility];  Service: Gynecology;  Laterality: N/A;    ECTOPIC PREGNANCY SURGERY     FRACTURE SURGERY N/A    Phreesia 07/26/2019   INCISION AND DRAINAGE OF WOUND  01/07/2012   Procedure: IRRIGATION AND DEBRIDEMENT WOUND;  Surgeon: Tilda Burrow, MD;  [facility]: [facility];  Service: Gynecology;;   INSERTION OF MESH  01/03/2012   Procedure: INSERTION OF MESH;  Surgeon: Tilda Burrow, MD;  [facility]: [facility];  Service: Gynecology;  Laterality: N/A;   MANDIBLE SURGERY Right cancer   Cancerous mass   NASAL SINUS SURGERY     ORIF ANKLE FRACTURE Right 05/09/2017   Procedure: OPEN REDUCTION INTERNAL FIXATION (ORIF) ANKLE FRACTURE;  Surgeon: Vickki Hearing, MD;  Location: [facility];  Service: Orthopedics;  Laterality: Right;   SECONDARY CLOSURE OF WOUND  01/22/2012   Procedure: SECONDARY CLOSURE OF WOUND;  Surgeon: Tilda Burrow, MD;  Location: [facility];  Service: Gynecology;  Laterality: N/A;   WOUND EXPLORATION  01/03/2012   Necrotizing fascitis at C section incision   WOUND EXPLORATION  01/05/2012   Procedure: WOUND EXPLORATION;  Surgeon: Tilda Burrow, MD;  Location: [facility];  Service: Gynecology;  Laterality: N/A;  Re-Exploration of Wound Abscess with Debridement   WOUND EXPLORATION  01/07/2012   Procedure: WOUND EXPLORATION;  Surgeon: Tilda Burrow, MD;  [facility]: [facility];  Service: Gynecology;;   WOUND EXPLORATION  01/11/2012   Procedure: WOUND EXPLORATION;  Surgeon: Tilda Burrow, MD;  Location: [facility];  Service: Gynecology;  Laterality: N/A;  Debridment of Wound Abscess   WOUND EXPLORATION  01/22/2012   Procedure: WOUND EXPLORATION;  Surgeon: Tilda Burrow, MD;  Location: [facility];  Service: Gynecology;  Laterality: N/A;  Delayed Secondary Wound Closure   WRIST SURGERY Left    Cyst removal   Patient Active Problem List   Diagnosis Date Noted   Neck muscle spasm 08/26/2022   Chronic right shoulder pain 08/26/2022   Night sweats 04/03/2022   Fatigue 04/03/2022   Obesity, Class II, BMI 35-39.9, 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 01/01/2022   Migraine  09/29/2021   Hx of migraines 07/30/2019   Pelvic pain in female 12/19/2016   Menorrhagia with regular cycle 09/04/2016   Fibroids 05/31/2016   Vitamin D deficiency 05/23/2016   Pelvic pain 05/21/2016    PCP: Gilmore Laroche, NP REFERRING PROVIDER: Albertha Ghee, MD  ONSET DATE: ~3 weeks  REFERRING DIAG: Chronic R shoulder Pain  THERAPY DIAG:  No diagnosis found.  Rationale for Evaluation and Treatment: Rehabilitation  SUBJECTIVE:   SUBJECTIVE STATEMENT: "I'm still having pain at night" Pt accompanied by: self  PERTINENT HISTORY: Past medical history of vasomotor symptoms and migraines   PRECAUTIONS: None  WEIGHT BEARING RESTRICTIONS: No  PAIN:  Are you having pain? Yes: NPRS scale: 4/10 Pain location: top of the shoulder and trapezius Pain description: soreness and aching Aggravating factors: sleeping Relieving factors: changing position  FALLS: Has patient fallen in last 6 months? No  PLOF: Independent  PATIENT GOALS: "To get the pain to stop"  NEXT MD VISIT: 10/04/22  OBJECTIVE:   HAND DOMINANCE: Right  ADLs: Overall ADLs: Pt has limitations with ADL's and IADL's due to increased pain and seizing/grabbing sensation, limiting ROM. Additionally this affects her sleep due to increased pain at night.   FUNCTIONAL OUTCOME MEASURES: FOTO: 70.12 FOTO: 11/02/22: 64.93  UPPER EXTREMITY ROM:       Assessed in seated, er/IR adducted  Active ROM Right eval Right 10/18/22 Right 11/02/22  Shoulder flexion 156 158 160  Shoulder abduction 167 169 170  Shoulder internal rotation 90 90 90  Shoulder external rotation 36 72 74  (Blank rows = not tested)    UPPER EXTREMITY MMT:     Assessed in seated, er/IR adducted  MMT Right eval Right 10/18/22 Right 11/02/22  Shoulder flexion 5/5 5/5 5/5  Shoulder abduction 4+/5 4+/5 5/5  Shoulder internal rotation 4+/5 5/5 5/5  Shoulder external rotation 4+/5 4+/5 5/5  (Blank rows = not  tested)  SENSATION: WFL  EDEMA: No swelling noted  OBSERVATIONS: moderate fascial restrictions noted in the trapezius, scapularis, pectoralis, deltoid.   TODAY'S TREATMENT:                                                                                                                              DATE:   11/02/22 -manual therapy: myofascial release and trigger point applied to biceps, deltoid, scapular region, and trapezius in  order to reduce fascial restrictions and pain, to improve ROM. -A/ROM: Seated, flexion, abduction, protraction, horizontal abduction, er/IR, x15 -Proximal shoulder exercises: paddles, criss cross, circles both directions, 2x12 -Loop Band Exercises: green band, wall taps, V ups, wall clocks, x10 -Reviewed HEP -Measurements for Reassessment and FOTO  10/26/22 -manual therapy: myofascial release and trigger point applied to biceps, deltoid, scapular region, and trapezius in order to reduce fascial restrictions and pain, to improve ROM. -A/ROM: Seated, flexion, abduction, protraction, horizontal abduction, er/IR, x15 -Proximal shoulder exercises: paddles, criss cross, circles both directions, 2x12 -Isometrics: flexion, extension, abduction, ir, 4x15" -ABC's on the wall -Scapular strengthening: green band, extension, retraction, rows, x15 -Shoulder strengthening: green band, flexion, abduction, horizontal abduction, protraction, er, IR, x15 -UBE: level 2, 2.5 mins forwards and backwards  10/18/22 -manual therapy: myofascial release and trigger point applied to biceps, deltoid, scapular region, and trapezius in order to reduce fascial restrictions and pain, to improve ROM. -A/ROM: Seated, flexion, abduction, protraction, horizontal abduction, er/IR, x15 -PNF Strengthening: green band, chest pulls, head height pulls, er pulls, PNF up, PNF down, x10 -Measurements for reassessment  10/04/22 -manual therapy: myofascial release and trigger point applied to biceps,  deltoid, scapular region, and trapezius in order to reduce fascial restrictions and pain, to improve ROM. -A/ROM: Seated, flexion, abduction, protraction, horizontal abduction, er/IR, x15 -Scapular strengthening: green band, extension, retraction, rows, x15 -Shoulder strengthening: green band, flexion, abduction, horizontal abduction, protraction, er, IR, x15 -Overhead lacing    PATIENT EDUCATION: Education details: Isometrics Person educated: Patient Education method: Programmer, multimedia, Facilities manager, and Handouts Education comprehension: verbalized understanding and returned demonstration  HOME EXERCISE PROGRAM: 7/19: A/ROM 8/15: shoulder strengthening and scapular strengthening 8/29: PNF strengthening 9/6: Isometrics  GOALS: Goals reviewed with patient? Yes   SHORT TERM GOALS: Target date: 10/19/22  Pt will be provided with and educated on HEP to improve mobility in RUE required for use during ADL completion.   Goal status: IN PROGRESS  Pt will decrease pain in RUE to 3/10 or less to improve ability to sleep for 2+ consecutive hours without waking due to pain.   Goal status: NOT MET  2.  Pt will decrease RUE fascial restrictions to min amounts or less to improve mobility required for functional reaching tasks.   Goal status: NOT MET  3.  Pt will increase RUE A/ROM by 15 degrees to improve ability to use RUE when reaching overhead or behind back during dressing and bathing tasks.   Goal status: MET  4.  Pt will increase RUE strength to 5/5 or greater to improve ability to use RUE when lifting or carrying items during meal preparation/housework/yardwork tasks.   Goal status: MET  5.  Pt will return to highest level of function using RUE as dominant during functional task completion.   Goal status: MET   ASSESSMENT:  CLINICAL IMPRESSION: This session, pt continuing to report pain and discomfort intermittently each day. OT added proximal shoulder exercises and isometrics  this session for joint strengthening and stability, which pt reported has provided some relief in pain. Pt only requiring short rest breaks this session during 3 exercises due to muscle fatigue. OT providing verbal and tactile cuing for positioning and technique.   PERFORMANCE DEFICITS: in functional skills including in functional skills including ADLs, IADLs, coordination, tone, ROM, strength, pain, fascial restrictions, muscle spasms, and UE functional use.  PLAN:  OT FREQUENCY: 1-2x/week  OT DURATION: 4 weeks  PLANNED INTERVENTIONS: self care/ADL training, therapeutic exercise, therapeutic activity, neuromuscular re-education, manual therapy, passive  range of motion, splinting, electrical stimulation, ultrasound, moist heat, cryotherapy, patient/family education, and DME and/or AE instructions  RECOMMENDED OTHER SERVICES: N/A  CONSULTED AND AGREED WITH PLAN OF CARE: Patient  PLAN FOR NEXT SESSION: Manual Therapy, A/ROM, Scapular strengthening, shoulder strengthening   Trish Mage, OTR/L [facility] [phone] Kennyth Arnold, OT 11/02/2022, 7:53 AM

## 2022-11-07 LAB — HM MAMMOGRAPHY

## 2022-11-12 ENCOUNTER — Encounter: Payer: Self-pay | Admitting: Family Medicine

## 2022-11-12 ENCOUNTER — Ambulatory Visit: Payer: BC Managed Care – PPO | Admitting: Family Medicine

## 2022-11-12 VITALS — BP 123/85 | HR 78 | Ht 62.0 in | Wt 199.1 lb

## 2022-11-12 DIAGNOSIS — R7303 Prediabetes: Secondary | ICD-10-CM | POA: Diagnosis not present

## 2022-11-12 DIAGNOSIS — E559 Vitamin D deficiency, unspecified: Secondary | ICD-10-CM | POA: Diagnosis not present

## 2022-11-12 DIAGNOSIS — R0981 Nasal congestion: Secondary | ICD-10-CM

## 2022-11-12 DIAGNOSIS — M25511 Pain in right shoulder: Secondary | ICD-10-CM | POA: Diagnosis not present

## 2022-11-12 DIAGNOSIS — E038 Other specified hypothyroidism: Secondary | ICD-10-CM

## 2022-11-12 DIAGNOSIS — E7849 Other hyperlipidemia: Secondary | ICD-10-CM

## 2022-11-12 DIAGNOSIS — Z23 Encounter for immunization: Secondary | ICD-10-CM

## 2022-11-12 DIAGNOSIS — G8929 Other chronic pain: Secondary | ICD-10-CM

## 2022-11-12 DIAGNOSIS — R7301 Impaired fasting glucose: Secondary | ICD-10-CM

## 2022-11-12 NOTE — Assessment & Plan Note (Signed)
For managing prediabetes, I recommend the following lifestyle changes:  Reduce Intake of High-Sugar Foods and Beverages: Limit foods and drinks high in sugar to help regulate blood sugar levels. Increase Consumption of Nutrient-Rich Foods: Focus on incorporating more fruits, vegetables, and whole grains into your diet. Choose Lean Proteins: Opt for lean proteins such as chicken, fish, beans, and legumes. Select Low-Fat Dairy Products: Choose low-fat or non-fat dairy options. Minimize Saturated Fats, Trans Fats, and Cholesterol: Reduce intake of foods high in saturated fats, trans fatty acids, and cholesterol. Engage in Regular Physical Activity: Aim for at least 30 minutes of brisk walking or other moderate activity at least 5 days a week.

## 2022-11-12 NOTE — Assessment & Plan Note (Signed)
Encouraged to continue taking weekly Vit d supplement

## 2022-11-12 NOTE — Assessment & Plan Note (Signed)
-  Continue taking your medications as needed. -Increase fluids and allow for plenty of rest. -Recommend using a humidifier at bedtime during sleep to help with nasal congestion. -Follow-up if your symptoms do not improve

## 2022-11-12 NOTE — Patient Instructions (Signed)
I appreciate the opportunity to provide care to you today!    Follow up:  4 months  Labs: please stop by the lab today to get your blood drawn (CBC, CMP, TSH, Lipid profile, HgA1c, Vit D)   URI -Continue taking your medication as needed. -Increase fluids and allow for plenty of rest. -Recommend using a humidifier at bedtime during sleep to help with nasal congestion. -Follow-up if your symptoms do not improve    For managing prediabetes, I recommend the following lifestyle changes:  Reduce Intake of High-Sugar Foods and Beverages: Limit foods and drinks high in sugar to help regulate blood sugar levels. Increase Consumption of Nutrient-Rich Foods: Focus on incorporating more fruits, vegetables, and whole grains into your diet. Choose Lean Proteins: Opt for lean proteins such as chicken, fish, beans, and legumes. Select Low-Fat Dairy Products: Choose low-fat or non-fat dairy options. Minimize Saturated Fats, Trans Fats, and Cholesterol: Reduce intake of foods high in saturated fats, trans fatty acids, and cholesterol. Engage in Regular Physical Activity: Aim for at least 30 minutes of brisk walking or other moderate activity at least 5 days a week.    Here are some foods to avoid or reduce in your diet to help manage cholesterol levels:  Fried Foods:Deep-fried items such as french fries, fried chicken, and fried snacks are high in unhealthy fats and can raise LDL (bad) cholesterol levels. Processed Meats:Foods like bacon, sausage, hot dogs, and deli meats are often high in saturated fat and cholesterol. Full-Fat Dairy Products:Whole milk, full-fat yogurt, butter, cream, and cheese are rich in saturated fats, which can increase cholesterol levels. Baked Gough and Sweets:Pastries, cakes, cookies, and donuts often contain trans fats and added sugars, which can raise LDL cholesterol and lower HDL (good) cholesterol. Red Meat:Beef, lamb, and pork are high in saturated fat. Lean cuts or  plant-based protein alternatives are better options. Lard and Shortening:Used in some baked [place], lard and shortening are high in trans fats and should be avoided. Fast Food:Many fast food items are cooked with unhealthy oils and contain high amounts of saturated and trans fats. Processed Snacks:Chips, crackers, and certain microwave popcorns can contain trans fats and high levels of unhealthy oils. Shellfish:While nutritious in other ways, some shellfish like shrimp, lobster, and crab are high in cholesterol. They should be consumed in moderation. Coconut and Palm Oils:these oils are high in saturated fat and can raise cholesterol levels when used in cooking or baking.      Please continue to a heart-healthy diet and increase your physical activities. Try to exercise for at least five days a week.    It was a pleasure to see you and I look forward to continuing to work together on your health and well-being. Please do not hesitate to call the office if you need care or have questions about your care.  In case of emergency, please visit the Emergency Department for urgent care, or contact our clinic at [phone] to schedule an appointment. We're here to help you!   Have a wonderful day and week. With Gratitude, Gilmore Laroche MSN, FNP-BC

## 2022-11-12 NOTE — Progress Notes (Signed)
Established Patient Office Visit  Subjective:  Patient ID: Kara Gentry, female    DOB: [birth year]  Age: 47 y.o. MRN: [id number]  CC:  Chief Complaint  Patient presents with   Care Management    3 month f/u, reports right shoulder pain still present, went to OT, didn't help much.    Nasal Congestion    Pt reports illness last week was seen at new urgent care in [place] was given as needed medication doesn't recall the name of meds, not taking anything at this time, still having nasal congestion.     HPI Kara Gentry is a 47 y.o. female with past medical history of vit d def, chronic right shoulder pain presents for f/u of  chronic medical conditions.  Chronic Right Shoulder Pain:The patient has completed occupational therapy for right shoulder stiffness and reports occasional stiffness and achiness in her right shoulder, particularly after prolonged inactivity. She denies experiencing shoulder pain and notes that her symptoms come and go. She has been prescribed a muscle relaxant, which she takes as needed with relief of her symptoms. There are no reports of numbness, tingling, or weakness in the affected shoulder and arms, and she has not experienced any recent trauma or injury.  Nasal Congestion:The patient reports that her nasal congestion began a week ago and that she followed up at urgent care, where she received pharmacological therapy. She indicates that her nasal congestion has improved and that she takes her medication as needed. Although she cannot recall the name of the medication, she confirms that it has provided relief. She was tested for COVID-19, RSV, and flu, all of which returned negative results. The patient denies experiencing fever, headaches, body aches, sore throat, or generalized malaise.  Vitamin D Deficiency:The patient is compliant with her treatment regimen and takes a weekly vitamin D supplement.  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   PONV (postoperative nausea and vomiting)    S/P ORIF (open reduction internal fixation) fracture 05/09/17 Ankle  Right     Seasonal allergies    Vitamin D deficiency 05/23/2016   Take 5000 IU vitamin D3 daily    Past Surgical History:  Procedure Laterality Date   APPLICATION OF WOUND VAC  01/03/2012   Procedure: APPLICATION OF WOUND VAC;  Surgeon: Tilda Burrow, MD;  Location: [facility];  Service: Gynecology;  Laterality: N/A;   APPLICATION OF WOUND VAC  01/05/2012   Procedure: APPLICATION OF WOUND VAC;  Surgeon: Tilda Burrow, MD;  Location: [facility];  Service: Gynecology;  Laterality: N/A;   APPLICATION OF WOUND VAC  01/07/2012   Procedure: APPLICATION OF WOUND VAC;  Surgeon: Tilda Burrow, MD;  Location: [facility];  Service: Gynecology;;   APPLICATION OF WOUND VAC  01/11/2012   Procedure: APPLICATION OF WOUND VAC;  Surgeon: Tilda Burrow, MD;  Location: [facility];  Service: Gynecology;  Laterality: N/A;  Fasicial Closure   CESAREAN SECTION  12/22/2011   Procedure: CESAREAN SECTION;  Surgeon: Reva Bores, MD;  Location: [facility];  Service: Obstetrics;  Laterality: N/A;  Primary cesarean section with delivery of Baby A girl at 44. Baby B girl at 50.  CESAREAN SECTION N/A    Phreesia 07/26/2019   DILITATION & CURRETTAGE/HYSTROSCOPY WITH NOVASURE ABLATION N/A 09/04/2016   Procedure: HYSTEROSCOPY WITH NOVASURE ABLATION;  Surgeon: Tilda Burrow, MD;  Location: [facility];  Service: Gynecology;  Laterality: N/A;   ECTOPIC PREGNANCY SURGERY     FRACTURE SURGERY N/A    [employer]reesia 07/26/2019   INCISION AND DRAINAGE OF WOUND  01/07/2012   Procedure: IRRIGATION AND DEBRIDEMENT WOUND;  Surgeon: Tilda Burrow, MD;  Location: [facility];  Service: Gynecology;;    INSERTION OF MESH  01/03/2012   Procedure: INSERTION OF MESH;  Surgeon: Tilda Burrow, MD;  Location: [facility];  Service: Gynecology;  Laterality: N/A;   MANDIBLE SURGERY Right cancer   Cancerous mass   NASAL SINUS SURGERY     ORIF ANKLE FRACTURE Right 05/09/2017   Procedure: OPEN REDUCTION INTERNAL FIXATION (ORIF) ANKLE FRACTURE;  Surgeon: Vickki Hearing, MD;  Location: [facility];  Service: Orthopedics;  Laterality: Right;   SECONDARY CLOSURE OF WOUND  01/22/2012   Procedure: SECONDARY CLOSURE OF WOUND;  Surgeon: Tilda Burrow, MD;  Location: [facility];  Service: Gynecology;  Laterality: N/A;   WOUND EXPLORATION  01/03/2012   Necrotizing fascitis at C section incision   WOUND EXPLORATION  01/05/2012   Procedure: WOUND EXPLORATION;  Surgeon: Tilda Burrow, MD;  Location: [facility];  Service: Gynecology;  Laterality: N/A;  Re-Exploration of Wound Abscess with Debridement   WOUND EXPLORATION  01/07/2012   Procedure: WOUND EXPLORATION;  Surgeon: Tilda Burrow, MD;  Location: [facility];  Service: Gynecology;;   WOUND EXPLORATION  01/11/2012   Procedure: WOUND EXPLORATION;  Surgeon: Tilda Burrow, MD;  Location: [facility];  Service: Gynecology;  Laterality: N/A;  Debridment of Wound Abscess   WOUND EXPLORATION  01/22/2012   Procedure: WOUND EXPLORATION;  Surgeon: Tilda Burrow, MD;  Location: [facility];  Service: Gynecology;  Laterality: N/A;  Delayed Secondary Wound Closure   WRIST SURGERY Left    Cyst removal    Family History  Problem Relation Age of Onset   Cancer Maternal Grandfather        prostate   Diabetes Mother    Arthritis Mother    Cancer Paternal Uncle    Cancer Maternal Uncle        lung   Diabetes Maternal Aunt    Arthritis Maternal Aunt     Social History   Socioeconomic History   Marital status: Married    Spouse name: Not on file   Number of children: Not on file   Years of education: masters   Highest education level: Not on file  Occupational History    Occupation: Magazine features editor: [employer]  Tobacco Use   Smoking status: Never   Smokeless tobacco: Never  Vaping Use   Vaping status: Never Used  Substance and Sexual Activity   Alcohol use: No   Drug use: No   Sexual activity: Yes    Birth control/protection: Surgical    Comment: ablation  Other Topics Concern   Not on file  Social History Narrative   Not on file   Social Determinants of Health   Financial Resource Strain: Not on file  Food Insecurity: Not on file  Transportation Needs: Not on file  Physical Activity: Not on file  Stress: Not on file  Social Connections: Unknown (07/04/2021)   [facility]   Social Network    Social Network: Not on  file  Intimate Partner Violence: Unknown (05/26/2021)   Received from [facility], [facility]   HITS    Physically Hurt: Not on file    Insult or Talk Down To: Not on file    Threaten Physical Harm: Not on file    Scream or Curse: Not on file    Outpatient Medications Prior to Visit  Medication Sig Dispense Refill   aspirin-acetaminophen-caffeine (EXCEDRIN MIGRAINE) 250-250-65 MG tablet Take 1-2 tablets by mouth daily as needed for headache.     cyclobenzaprine (FLEXERIL) 5 MG tablet Take 5 mg by mouth 3 (three) times daily as needed for muscle spasms.     Multiple Vitamins-Minerals (ONE-A-DAY WOMENS PO) Take by mouth.     VITAMIN D PO Take by mouth.     meloxicam (MOBIC) 7.5 MG tablet Take 1 tablet (7.5 mg total) by mouth daily. 10 tablet 0   PARoxetine (PAXIL) 10 MG tablet Take 1 tablet (10 mg total) by mouth daily. 30 tablet 1   No facility-administered medications prior to visit.    No Known Allergies  ROS Review of Systems  Constitutional:  Negative for chills and fever.  Eyes:  Negative for visual disturbance.  Respiratory:  Negative for chest tightness and shortness of breath.   Neurological:  Negative for dizziness and headaches.      Objective:    Physical  Exam HENT:     Head: Normocephalic.     Mouth/Throat:     Mouth: Mucous membranes are moist.  Cardiovascular:     Rate and Rhythm: Normal rate.     Heart sounds: Normal heart sounds.  Pulmonary:     Effort: Pulmonary effort is normal.     Breath sounds: Normal breath sounds.  Musculoskeletal:     Comments: Active ROM of the right and left shoulder with no pain reported.   Neurological:     Mental Status: She is alert.     BP 123/85   Pulse 78   Ht 5\' 2"  (1.575 m)   Wt 199 lb 1.9 oz (90.3 kg)   SpO2 97%   BMI 36.42 kg/m  Wt Readings from Last 3 Encounters:  11/12/22 199 lb 1.9 oz (90.3 kg)  08/21/22 199 lb 1.9 oz (90.3 kg)  07/04/22 196 lb 3.2 oz (89 kg)    Lab Results  Component Value Date   TSH 0.990 07/04/2022   Lab Results  Component Value Date   WBC 5.4 07/04/2022   HGB 12.2 07/04/2022   HCT 37.7 07/04/2022   MCV 86 07/04/2022   PLT 257 07/04/2022   Lab Results  Component Value Date   NA 141 07/04/2022   K 4.2 07/04/2022   CO2 22 07/04/2022   GLUCOSE 106 (H) 07/04/2022   BUN 15 07/04/2022   CREATININE 0.62 07/04/2022   BILITOT 0.6 07/04/2022   ALKPHOS 47 07/04/2022   AST 13 07/04/2022   ALT 13 07/04/2022   PROT 7.0 07/04/2022   ALBUMIN 4.2 07/04/2022   CALCIUM 9.0 07/04/2022   ANIONGAP 11 05/08/2017   EGFR 110 07/04/2022   Lab Results  Component Value Date   CHOL 184 07/04/2022   Lab Results  Component Value Date   HDL 51 07/04/2022   Lab Results  Component Value Date   LDLCALC 125 (H) 07/04/2022   Lab Results  Component Value Date   TRIG 39 07/04/2022   Lab Results  Component Value Date   CHOLHDL 3.6 07/04/2022   Lab Results  Component Value Date  HGBA1C 6.1 (H) 07/04/2022      Assessment & Plan:  Prediabetes Assessment & Plan: For managing prediabetes, I recommend the following lifestyle changes:  Reduce Intake of High-Sugar Foods and Beverages: Limit foods and drinks high in sugar to help regulate blood sugar  levels. Increase Consumption of Nutrient-Rich Foods: Focus on incorporating more fruits, vegetables, and whole grains into your diet. Choose Lean Proteins: Opt for lean proteins such as chicken, fish, beans, and legumes. Select Low-Fat Dairy Products: Choose low-fat or non-fat dairy options. Minimize Saturated Fats, Trans Fats, and Cholesterol: Reduce intake of foods high in saturated fats, trans fatty acids, and cholesterol. Engage in Regular Physical Activity: Aim for at least 30 minutes of brisk walking or other moderate activity at least 5 days a week.    Nasal congestion Assessment & Plan: -Continue taking your medications as needed. -Increase fluids and allow for plenty of rest. -Recommend using a humidifier at bedtime during sleep to help with nasal congestion. -Follow-up if your symptoms do not improve     Chronic right shoulder pain Assessment & Plan: Encouraged the patient to perform stretching and strengthening exercises, ensure adequate rest, apply heat, and avoid aggravating activities. Additionally, the patient is advised to continue taking the prescribed muscle relaxant and to follow up with orthopedic surgery if symptoms worsen.   Vitamin D deficiency Assessment & Plan: Encouraged to continue taking weekly Vit d supplement  Orders: -     VITAMIN D 25 Hydroxy (Vit-D Deficiency, Fractures)  IFG (impaired fasting glucose) -     Hemoglobin A1c  Other specified hypothyroidism -     TSH + free T4  Other hyperlipidemia -     Lipid panel -     CMP14+EGFR -     CBC with Differential/Platelet  Encounter for immunization -     Flu vaccine trivalent PF, 6mos and older(Flulaval,Afluria,Fluarix,Fluzone)  Note: This chart has been completed using Engineer, civil (consulting) software, and while attempts have been made to ensure accuracy, certain words and phrases may not be transcribed as intended.    Follow-up: Return in about 4 months (around 03/14/2023).   Gilmore Laroche, FNP

## 2022-11-12 NOTE — Assessment & Plan Note (Signed)
Encouraged the patient to perform stretching and strengthening exercises, ensure adequate rest, apply heat, and avoid aggravating activities. Additionally, the patient is advised to continue taking the prescribed muscle relaxant and to follow up with orthopedic surgery if symptoms worsen.

## 2022-11-13 LAB — CBC WITH DIFFERENTIAL/PLATELET
Basophils Absolute: 0 10*3/uL (ref 0.0–0.2)
Basos: 0 %
EOS (ABSOLUTE): 0.1 10*3/uL (ref 0.0–0.4)
Eos: 1 %
Hematocrit: 39.4 % (ref 34.0–46.6)
Hemoglobin: 12.4 g/dL (ref 11.1–15.9)
Immature Grans (Abs): 0 10*3/uL (ref 0.0–0.1)
Immature Granulocytes: 0 %
Lymphocytes Absolute: 1.7 10*3/uL (ref 0.7–3.1)
Lymphs: 31 %
MCH: 27.2 pg (ref 26.6–33.0)
MCHC: 31.5 g/dL (ref 31.5–35.7)
MCV: 86 fL (ref 79–97)
Monocytes Absolute: 0.3 10*3/uL (ref 0.1–0.9)
Monocytes: 5 %
Neutrophils Absolute: 3.5 10*3/uL (ref 1.4–7.0)
Neutrophils: 63 %
Platelets: 310 10*3/uL (ref 150–450)
RBC: 4.56 x10E6/uL (ref 3.77–5.28)
RDW: 12.7 % (ref 11.7–15.4)
WBC: 5.6 10*3/uL (ref 3.4–10.8)

## 2022-11-13 LAB — CMP14+EGFR
ALT: 14 IU/L (ref 0–32)
AST: 15 IU/L (ref 0–40)
Albumin: 4.3 g/dL (ref 3.9–4.9)
Alkaline Phosphatase: 55 IU/L (ref 44–121)
BUN/Creatinine Ratio: 23 (ref 9–23)
BUN: 15 mg/dL (ref 6–24)
Bilirubin Total: 0.4 mg/dL (ref 0.0–1.2)
CO2: 21 mmol/L (ref 20–29)
Calcium: 9.1 mg/dL (ref 8.7–10.2)
Chloride: 105 mmol/L (ref 96–106)
Creatinine, Ser: 0.66 mg/dL (ref 0.57–1.00)
Globulin, Total: 3.1 g/dL (ref 1.5–4.5)
Glucose: 101 mg/dL — ABNORMAL HIGH (ref 70–99)
Potassium: 4.1 mmol/L (ref 3.5–5.2)
Sodium: 142 mmol/L (ref 134–144)
Total Protein: 7.4 g/dL (ref 6.0–8.5)
eGFR: 109 mL/min/{1.73_m2} (ref >59)

## 2022-11-13 LAB — LIPID PANEL
Chol/HDL Ratio: 4.3 ratio (ref 0.0–4.4)
Cholesterol, Total: 195 mg/dL (ref 100–199)
HDL: 45 mg/dL (ref >39)
LDL Chol Calc (NIH): 137 mg/dL — ABNORMAL HIGH (ref 0–99)
Triglycerides: 73 mg/dL (ref 0–149)
VLDL Cholesterol Cal: 13 mg/dL (ref 5–40)

## 2022-11-13 LAB — TSH+FREE T4
Free T4: 1.31 ng/dL (ref 0.82–1.77)
TSH: 1.21 u[IU]/mL (ref 0.450–4.500)

## 2022-11-13 LAB — HEMOGLOBIN A1C
Est. average glucose Bld gHb Est-mCnc: 134 mg/dL
Hgb A1c MFr Bld: 6.3 % — ABNORMAL HIGH (ref 4.8–5.6)

## 2022-11-13 LAB — VITAMIN D 25 HYDROXY (VIT D DEFICIENCY, FRACTURES): Vit D, 25-Hydroxy: 28 ng/mL — ABNORMAL LOW (ref 30.0–100.0)

## 2022-11-14 ENCOUNTER — Other Ambulatory Visit: Payer: Self-pay | Admitting: Family Medicine

## 2022-11-14 DIAGNOSIS — E559 Vitamin D deficiency, unspecified: Secondary | ICD-10-CM

## 2022-11-14 MED ORDER — VITAMIN D (ERGOCALCIFEROL) 1.25 MG (50000 UNIT) PO CAPS: 50000.0000 [IU] | ORAL_CAPSULE | ORAL | 1 refills | Status: DC

## 2022-11-14 NOTE — Progress Notes (Signed)
Please inform the patient that her weekly vitamin D supplement has been sent to her pharmacy, and she should begin taking it due to low vitamin D levels. Her labs indicate that she is prediabetic, so I recommend decreasing her intake of high-sugar foods and beverages while increasing physical activity. Additionally, her cholesterol levels are elevated; therefore, reducing her intake of greasy, starchy, and fatty foods, along with increasing physical activity, is advised. All other lab results are stable.

## 2022-12-03 ENCOUNTER — Other Ambulatory Visit (INDEPENDENT_AMBULATORY_CARE_PROVIDER_SITE_OTHER): Payer: BC Managed Care – PPO

## 2022-12-03 ENCOUNTER — Encounter: Payer: Self-pay | Admitting: Family Medicine

## 2022-12-03 ENCOUNTER — Encounter: Payer: Self-pay | Admitting: Orthopedic Surgery

## 2022-12-03 ENCOUNTER — Ambulatory Visit: Payer: BC Managed Care – PPO | Admitting: Orthopedic Surgery

## 2022-12-03 VITALS — BP 137/84 | HR 76 | Ht 62.0 in | Wt 199.0 lb

## 2022-12-03 DIAGNOSIS — M4722 Other spondylosis with radiculopathy, cervical region: Secondary | ICD-10-CM

## 2022-12-03 DIAGNOSIS — M792 Neuralgia and neuritis, unspecified: Secondary | ICD-10-CM | POA: Diagnosis not present

## 2022-12-03 DIAGNOSIS — G8929 Other chronic pain: Secondary | ICD-10-CM

## 2022-12-03 DIAGNOSIS — M25511 Pain in right shoulder: Secondary | ICD-10-CM

## 2022-12-03 MED ORDER — MELOXICAM 7.5 MG PO TABS: 7.5000 mg | ORAL_TABLET | Freq: Every day | ORAL | 5 refills | Status: DC

## 2022-12-03 MED ORDER — GABAPENTIN 100 MG PO CAPS
100.0000 mg | ORAL_CAPSULE | Freq: Every day | ORAL | 2 refills | Status: AC
Start: 2022-12-03 — End: ?

## 2022-12-03 NOTE — Progress Notes (Signed)
Office Visit Note   Patient: Kara Gentry           Date of Birth: 11/15/1975           MRN: [id number] Visit Date: 12/03/2022 Requested by: Gilmore Laroche, FNP [address] [place],  [place] [address] PCP: Gilmore Laroche, FNP   Assessment & Plan:   Encounter Diagnoses  Name Primary?   Radicular pain of right upper extremity    Chronic right shoulder pain    Cervical spondylosis with radiculopathy Yes    Meds ordered this encounter  Medications   gabapentin (NEURONTIN) 100 MG capsule    Sig: Take 1 capsule (100 mg total) by mouth at bedtime.    Dispense:  60 capsule    Refill:  2   meloxicam (MOBIC) 7.5 MG tablet    Sig: Take 1 tablet (7.5 mg total) by mouth daily.    Dispense:  30 tablet    Refill:  5    Differential diagnosis cervical spine disease versus shoulder pathology  Seems to have cervical pathology with cervical spondylosis and radiculopathy into the right upper extremity without surgical indications  Recommend physical therapy and medication  Follow-up in 6 weeks  Meds ordered this encounter  Medications   gabapentin (NEURONTIN) 100 MG capsule    Sig: Take 1 capsule (100 mg total) by mouth at bedtime.    Dispense:  60 capsule    Refill:  2   meloxicam (MOBIC) 7.5 MG tablet    Sig: Take 1 tablet (7.5 mg total) by mouth daily.    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  Seems to be asymptomatic during the day.  Her symptoms began after she was watching TV  with her neck extended and then began to have symptoms.  She has not had formal neck therapy  She did have some medication which was a muscle relaxer              ROS: Does not drop things does not seem to have any carpal tunnel symptoms   Images personally read and my interpretation : Cervical spondylosis normal right shoulder  Visit Diagnoses:  1. Cervical spondylosis with radiculopathy   2. Radicular pain of right upper extremity   3. Chronic right shoulder pain      Follow-Up Instructions: Return in about 6 weeks (around 01/14/2023) for FOLLOW UP C spine.    Objective: Vital Signs: BP 137/84   Pulse 76   Ht 5\' 2"  (1.575 m)   Wt 199 lb (90.3 kg)   BMI 36.40 kg/m   Physical Exam Vitals and nursing note reviewed.  Constitutional:      Appearance: Normal appearance.  HENT:     Head: Normocephalic and atraumatic.  Eyes:     General: No scleral icterus.       Right eye: No discharge.  Left eye: No discharge.     Extraocular Movements: Extraocular movements intact.     Conjunctiva/sclera: Conjunctivae normal.     Pupils: Pupils are equal, round, and reactive to light.  Cardiovascular:     Rate and Rhythm: Normal rate.     Pulses: Normal pulses.  Skin:    General: Skin is warm and dry.     Capillary Refill: Capillary refill takes less than 2 seconds.  Neurological:     General: No focal deficit present.     Mental Status: She is alert and oriented to person, place, and time.  Psychiatric:        Mood and Affect: Mood normal.        Behavior: Behavior normal.        Thought Content: Thought content normal.        Judgment: Judgment normal.      Ortho Exam  Shoulder exam Skin normal no surgical incisions no tenderness Full range of motion No instability Strength normal no cuff findings  C-spine upper extremity exam no tenderness in the mid cervical spine or lateral musculature however when she rotated to the right ear to shoulder to the right and  extension she felt like "I ve been working out" this was different than what she felt when we did these test to the opposite side   Specialty Comments:  No specialty comments available.  Imaging: DG Shoulder Right  Result Date: 12/03/2022 Right shoulder AP lateral Normal glenohumeral joint without osteophyte formation no narrowing normal humeral head normal visible humerus and chest wall Normal shoulder   DG Cervical Spine 2 or 3 views  Result Date: 12/03/2022 Arm and shoulder pain with tingling in the hand radiating from the shoulder and neck area shows straightening of the cervical dosis with C5-6 and C6-7 disc changes Mid cervical arthrosis as well Impression cervical spondylosis     PMFS History: Patient Active Problem List   Diagnosis Date Noted   Nasal congestion 11/12/2022   Prediabetes 11/12/2022   Neck muscle spasm 08/26/2022   Chronic right shoulder pain 08/26/2022   Night sweats 04/03/2022   Fatigue 04/03/2022   Obesity, Class II, BMI 35-39.9, 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 01/01/2022   Migraine 09/29/2021   Hx of migraines 07/30/2019   Pelvic pain in female 12/19/2016   Menorrhagia with regular cycle 09/04/2016   Fibroids 05/31/2016   Vitamin D deficiency 05/23/2016   Pelvic pain 05/21/2016   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   PONV (postoperative nausea and vomiting)    S/P ORIF (open reduction internal fixation) fracture 05/09/17 Ankle  Right     Seasonal allergies    Vitamin D deficiency 05/23/2016   Take 5000 IU vitamin D3 daily    Family History  Problem Relation Age of Onset   Cancer Maternal Grandfather        prostate   Diabetes Mother    Arthritis Mother    Cancer Paternal Uncle    Cancer Maternal Uncle         lung   Diabetes Maternal Aunt    Arthritis Maternal Aunt     Past Surgical History:  Procedure Laterality Date   APPLICATION OF  WOUND VAC  01/03/2012   Procedure: APPLICATION OF WOUND VAC;  Surgeon: Tilda Burrow, MD;  Location: [facility];  Service: Gynecology;  Laterality: N/A;   APPLICATION OF WOUND VAC  01/05/2012   Procedure: APPLICATION OF WOUND VAC;  Surgeon: Tilda Burrow, MD;  Location: [facility];  Service: Gynecology;  Laterality: N/A;   APPLICATION OF WOUND VAC  01/07/2012   Procedure: APPLICATION OF WOUND VAC;  Surgeon: Tilda Burrow, MD;  Location: [facility];  Service: Gynecology;;   APPLICATION OF WOUND VAC  01/11/2012   Procedure: APPLICATION OF WOUND VAC;  Surgeon: Tilda Burrow, MD;  Location: [facility];  Service: Gynecology;  Laterality: N/A;  Fasicial Closure   CESAREAN SECTION  12/22/2011   Procedure: CESAREAN SECTION;  Surgeon: Reva Bores, MD;  Location: [facility];  Service: Obstetrics;  Laterality: N/A;  Primary cesarean section with delivery of Baby A girl at 38. Baby B girl at 34.   CESAREAN SECTION N/A    Phreesia 07/26/2019   DILITATION & CURRETTAGE/HYSTROSCOPY WITH NOVASURE ABLATION N/A 09/04/2016   Procedure: HYSTEROSCOPY WITH NOVASURE ABLATION;  Surgeon: Tilda Burrow, MD;  Location: [facility];  Service: Gynecology;  Laterality: N/A;   ECTOPIC PREGNANCY SURGERY     FRACTURE SURGERY N/A    Phreesia 07/26/2019   INCISION AND DRAINAGE OF WOUND  01/07/2012   Procedure: IRRIGATION AND DEBRIDEMENT WOUND;  Surgeon: Tilda Burrow, MD;  Location: [facility];  Service: Gynecology;;   INSERTION OF MESH  01/03/2012   Procedure: INSERTION OF MESH;  Surgeon: Tilda Burrow, MD;  Location: [facility];  Service: Gynecology;  Laterality: N/A;   MANDIBLE SURGERY Right cancer   Cancerous mass   NASAL SINUS SURGERY     ORIF ANKLE FRACTURE Right 05/09/2017   Procedure: OPEN REDUCTION INTERNAL FIXATION (ORIF) ANKLE FRACTURE;  Surgeon: Vickki Hearing, MD;  Location: [facility];   Service: Orthopedics;  Laterality: Right;   SECONDARY CLOSURE OF WOUND  01/22/2012   Procedure: SECONDARY CLOSURE OF WOUND;  Surgeon: Tilda Burrow, MD;  [facility]: [facility];  Service: Gynecology;  Laterality: N/A;   WOUND EXPLORATION  01/03/2012   Necrotizing fascitis at C section incision   WOUND EXPLORATION  01/05/2012   Procedure: WOUND EXPLORATION;  Surgeon: Tilda Burrow, MD;  [facility]: [facility];  Service: Gynecology;  Laterality: N/A;  Re-Exploration of Wound Abscess with Debridement   WOUND EXPLORATION  01/07/2012   Procedure: WOUND EXPLORATION;  Surgeon: Tilda Burrow, MD;  [facility]: [facility];  Service: Gynecology;;   WOUND EXPLORATION  01/11/2012   Procedure: WOUND EXPLORATION;  Surgeon: Tilda Burrow, MD;  [facility]: [facility];  Service: Gynecology;  Laterality: N/A;  Debridment of Wound Abscess   WOUND EXPLORATION  01/22/2012   Procedure: WOUND EXPLORATION;  Surgeon: Tilda Burrow, MD;  Location: [facility];  Service: Gynecology;  Laterality: N/A;  Delayed Secondary Wound Closure   WRIST SURGERY Left    Cyst removal   Social History   Occupational History   Occupation: Magazine features editor: [employer]  Tobacco Use   Smoking status: Never   Smokeless tobacco: Never  Vaping Use   Vaping status: Never Used  Substance and Sexual Activity   Alcohol use: No   Drug use: No   Sexual activity: Yes    Birth control/protection: Surgical    Comment: ablation

## 2022-12-04 ENCOUNTER — Encounter (HOSPITAL_COMMUNITY): Payer: Self-pay

## 2022-12-04 ENCOUNTER — Ambulatory Visit (HOSPITAL_COMMUNITY): Payer: BC Managed Care – PPO

## 2022-12-04 ENCOUNTER — Ambulatory Visit (HOSPITAL_COMMUNITY): Admission: RE | Admit: 2022-12-04 | Payer: BC Managed Care – PPO | Source: Ambulatory Visit

## 2022-12-04 ENCOUNTER — Encounter (HOSPITAL_COMMUNITY): Payer: BC Managed Care – PPO

## 2022-12-05 ENCOUNTER — Other Ambulatory Visit: Payer: Self-pay | Admitting: Family Medicine

## 2022-12-05 DIAGNOSIS — L309 Dermatitis, unspecified: Secondary | ICD-10-CM

## 2022-12-05 MED ORDER — TRIAMCINOLONE ACETONIDE 0.1 % EX CREA
1.0000 | TOPICAL_CREAM | Freq: Two times a day (BID) | CUTANEOUS | 1 refills | Status: AC
Start: 2022-12-05 — End: ?

## 2022-12-13 NOTE — Therapy (Signed)
OUTPATIENT PHYSICAL THERAPY CERVICAL EVALUATION   Patient Name: Kara Gentry MRN: [id number] DOB:1975-08-02, 47 y.o., female Today's Date: 12/14/2022  END OF SESSION:  PT End of Session - 12/14/22 0726     Visit Number 1    Number of Visits 6    Date for PT Re-Evaluation 01/25/23    Authorization Type [employer] [place]    PT Start Time 0720    PT Stop Time 0800    PT Time Calculation (min) 40 min    Activity Tolerance Patient tolerated treatment well    Behavior During Therapy [facility] for tasks assessed/performed             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   PONV (postoperative nausea and vomiting)    S/P ORIF (open reduction internal fixation) fracture 05/09/17 Ankle  Right     Seasonal allergies    Vitamin D deficiency 05/23/2016   Take 5000 IU vitamin D3 daily   Past Surgical History:  Procedure Laterality Date   APPLICATION OF WOUND VAC  01/03/2012   Procedure: APPLICATION OF WOUND VAC;  Surgeon: Tilda Burrow, MD;  [facility]: [facility];  Service: Gynecology;  Laterality: N/A;   APPLICATION OF WOUND VAC  01/05/2012   Procedure: APPLICATION OF WOUND VAC;  Surgeon: Tilda Burrow, MD;  [facility]: [facility];  Service: Gynecology;  Laterality: N/A;   APPLICATION OF WOUND VAC  01/07/2012   Procedure: APPLICATION OF WOUND VAC;  Surgeon: Tilda Burrow, MD;  [facility]: [facility];  Service: Gynecology;;   APPLICATION OF WOUND VAC  01/11/2012   Procedure: APPLICATION OF WOUND VAC;  Surgeon: Tilda Burrow, MD;  [facility];  Service: Gynecology;  Laterality: N/A;  Fasicial Closure   CESAREAN SECTION  12/22/2011   Procedure: CESAREAN SECTION;  Surgeon: Reva Bores, MD;  Location: [facility];  Service: Obstetrics;  Laterality: N/A;   Primary cesarean section with delivery of Baby A girl at 80. Baby B girl at 58.   CESAREAN SECTION N/A    Phreesia 07/26/2019   DILITATION & CURRETTAGE/HYSTROSCOPY WITH NOVASURE ABLATION N/A 09/04/2016   Procedure: HYSTEROSCOPY WITH NOVASURE ABLATION;  Surgeon: Tilda Burrow, MD;  [facility]: [facility];  Service: Gynecology;  Laterality: N/A;   ECTOPIC PREGNANCY SURGERY     FRACTURE SURGERY N/A    Phreesia 07/26/2019   INCISION AND DRAINAGE OF WOUND  01/07/2012   Procedure: IRRIGATION AND DEBRIDEMENT WOUND;  Surgeon: Tilda Burrow, MD;  [facility]: [facility];  Service: Gynecology;;   INSERTION OF MESH  01/03/2012   Procedure: INSERTION OF MESH;  Surgeon: Tilda Burrow, MD;  [facility]: [facility];  Service: Gynecology;  Laterality: N/A;   MANDIBLE SURGERY Right cancer   Cancerous mass   NASAL SINUS SURGERY     ORIF ANKLE FRACTURE Right 05/09/2017   Procedure: OPEN REDUCTION INTERNAL FIXATION (ORIF) ANKLE FRACTURE;  Surgeon: Vickki Hearing, MD;  Location: [facility];  Service: Orthopedics;  Laterality: Right;   SECONDARY CLOSURE OF WOUND  01/22/2012   Procedure: SECONDARY CLOSURE OF WOUND;  Surgeon: Tilda Burrow, MD;  [facility]: [facility];  Service: Gynecology;  Laterality: N/A;   WOUND EXPLORATION  01/03/2012   Necrotizing fascitis at C section incision   WOUND EXPLORATION  01/05/2012   Procedure: WOUND EXPLORATION;  Surgeon: Tilda Burrow, MD;  [facility]: [facility];  Service: Gynecology;  Laterality: N/A;  Re-Exploration of Wound Abscess with Debridement   WOUND EXPLORATION  01/07/2012   Procedure: WOUND EXPLORATION;  Surgeon: Tilda Burrow, MD;  [facility]: [facility];  Service: Gynecology;;   WOUND EXPLORATION  01/11/2012   Procedure: WOUND EXPLORATION;  Surgeon: Tilda Burrow, MD;  [facility]: [facility];  Service: Gynecology;  Laterality: N/A;  Debridment of Wound Abscess   WOUND EXPLORATION  01/22/2012   Procedure: WOUND EXPLORATION;  Surgeon: Tilda Burrow, MD;  [facility];  Service:  Gynecology;  Laterality: N/A;  Delayed Secondary Wound Closure   WRIST SURGERY Left    Cyst removal   Patient Active Problem List   Diagnosis Date Noted   Nasal congestion 11/12/2022   Prediabetes 11/12/2022   Neck muscle spasm 08/26/2022   Chronic right shoulder pain 08/26/2022   Night sweats 04/03/2022   Fatigue 04/03/2022   Obesity, Class II, BMI 35-39.9, 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 01/01/2022   Migraine 09/29/2021   Hx of migraines 07/30/2019   Pelvic pain in female 12/19/2016   Menorrhagia with regular cycle 09/04/2016   Fibroids 05/31/2016   Vitamin D deficiency 05/23/2016   Pelvic pain 05/21/2016    PCP: Gilmore Laroche, FNP  REFERRING PROVIDER: Vickki Hearing, MD  REFERRING DIAG:  Diagnosis  M79.2 (ICD-10-CM) - Radicular pain of right upper extremity  M47.22 (ICD-10-CM) - Cervical spondylosis with radiculopathy    THERAPY DIAG:  Neck pain  Radiculopathy, cervical region  Rationale for Evaluation and Treatment: Rehabilitation  ONSET DATE: 10 years ago/off and on  SUBJECTIVE:                                                                                                                                                                                                         SUBJECTIVE STATEMENT: Was coming for here for her shoulder; last visit 9/13 Hand dominance: Right  PERTINENT HISTORY:  History of ankle fx 2019  PAIN:  Are you having pain? Yes: NPRS scale: 3-9/10 Pain location: right shoulder Pain description: aching, sore Aggravating factors: nighttime Relieving factors: warmth, medication  PRECAUTIONS: None   WEIGHT BEARING RESTRICTIONS: No  FALLS:  Has patient fallen in last 6 months? No  OCCUPATION: PRINCIPLE  PLOF: Independent  PATIENT GOALS: no pain right shoulder  NEXT MD VISIT: 01/21/2023  OBJECTIVE:  Note: Objective measures were completed at Evaluation unless otherwise noted.  DIAGNOSTIC FINDINGS:    Arm and shoulder pain with  tingling in the hand radiating from the shoulder and neck area shows straightening of the cervical dosis with C5-6 and C6-7 disc changes   Mid cervical arthrosis as well   Impression cervical spondylosis  PATIENT SURVEYS:  FOTO [id number]  COGNITION: Overall cognitive status: Within functional limits for tasks assessed  SENSATION: WFL Sometimes right arm "goes to sleep" at night  POSTURE: rounded shoulders, forward head, and increased thoracic kyphosis  PALPATION: Bilateral tightness upper trap   CERVICAL ROM: right upper trap soreness  Active ROM AROM (deg) eval  Flexion 36  Extension 52  Right lateral flexion 45  Left lateral flexion 44  Right rotation 72  Left rotation 74   (Blank rows = not tested)  UPPER EXTREMITY ROM:  Active ROM Right eval Left eval  Shoulder flexion wfl wfl  Shoulder extension    Shoulder abduction    Shoulder adduction    Shoulder extension    Shoulder internal rotation    Shoulder external rotation    Elbow flexion    Elbow extension    Wrist flexion    Wrist extension    Wrist ulnar deviation    Wrist radial deviation    Wrist pronation    Wrist supination     (Blank rows = not tested)  UPPER EXTREMITY MMT:  MMT Right eval Left eval  Shoulder flexion    Shoulder extension    Shoulder abduction    Shoulder adduction    Shoulder extension    Shoulder internal rotation    Shoulder external rotation    Middle trapezius    Lower trapezius    Elbow flexion    Elbow extension    Wrist flexion    Wrist extension    Wrist ulnar deviation    Wrist radial deviation    Wrist pronation    Wrist supination    Grip strength     (Blank rows = not tested)  CERVICAL SPECIAL TESTS:  Distraction test: Negative  FUNCTIONAL TESTS:    TODAY'S TREATMENT:                                                                                                                              DATE: 12/14/22 physical therapy evaluation and HEP  instruction   PATIENT EDUCATION:  Education details: Patient educated on exam findings, POC, scope of PT, HEP, and what to expect next visit. Person educated: Patient Education method: Explanation, Demonstration, and Handouts Education comprehension: verbalized understanding, returned demonstration, verbal cues required, and tactile cues required   HOME EXERCISE PROGRAM: Access Code: [facility] URL: [email or web address] Date: 12/14/2022 Prepared by: [facility]  Exercises - Seated Cervical Retraction  - 1 x daily - 7 x weekly - 3 sets - 10 reps - Supine Chin Tuck  - 1 x daily - 7 x weekly - 3 sets - 10 reps - seated posture with towel  roll  - 1 x daily - 7 x weekly - 3 sets - 10 reps  ASSESSMENT:  CLINICAL IMPRESSION: Patient is a 47 y.o. female who was seen today for physical therapy evaluation and treatment for  Diagnosis  M79.2 (ICD-10-CM) - Radicular pain of right upper extremity  M47.22 (ICD-10-CM) - Cervical spondylosis with radiculopathy  .   OBJECTIVE IMPAIRMENTS: decreased activity tolerance, decreased mobility, decreased strength, increased fascial restrictions, impaired perceived functional ability, increased muscle spasms, postural dysfunction, and pain.   ACTIVITY LIMITATIONS: carrying, lifting, sitting, reach over head, and caring for others  PARTICIPATION LIMITATIONS: meal prep, cleaning, community activity, and occupation  REHAB POTENTIAL: Good  CLINICAL DECISION MAKING: Stable/uncomplicated  EVALUATION COMPLEXITY: Low   GOALS: Goals reviewed with patient? No  SHORT TERM GOALS: Target date: 01/04/2023  patient will be independent with initial HEP  Baseline:  Goal status: INITIAL  2.  Patient will self report 50% improvement to improve tolerance for functional activity  Baseline:  Goal status: INITIAL   LONG TERM GOALS: Target date: 01/25/2023  Patient will be independent in self management strategies to improve quality of life and  functional outcomes.  Baseline:  Goal status: INITIAL  2.  Patient will self report 75% improvement to improve tolerance for functional activity  Baseline:  Goal status: INITIAL  3.  Patient will increase bilateral UE MMTs to 5/5 without pain to promote return to normal reaching and lifting in home and at work  Baseline: see above Goal status: INITIAL  4.  Patient will increase cervical mobility by 20 degrees grossly to improve ability to scan for safety with driving Baseline:  Goal status: INITIAL  5.  Patient will demonstrate good sitting posture x 15' to drive without neck or shoulder symptoms to work and back Baseline:  Goal status: INITIAL  6.  Patient will have decreased Right shoulder pain to 2/10 at night to allow her to sleep through the night without pain waking her Baseline:  Goal status: INITIAL   PLAN:  PT FREQUENCY: 1x/week  PT DURATION: 6 weeks  PLANNED INTERVENTIONS: [id number]- PT Re-evaluation, 97110-Therapeutic exercises, 97530- Therapeutic activity, 97112- Neuromuscular re-education, 97535- Self Care, 30865- Manual therapy, [phone]- Gait training, [phone]- Orthotic Fit/training, [phone]- Canalith repositioning, [id number]- Aquatic Therapy, [phone]- Splinting, Patient/Family education, Balance training, Stair training, Taping, Dry Needling, Joint mobilization, Joint manipulation, Spinal manipulation, Spinal mobilization, Scar mobilization, and DME instructions.   PLAN FOR NEXT SESSION: Review HEP and goals; monitor reaction to cervical retractions; looking to centralize symptoms; manual as needed; postural strengthening.   8:57 AM, 12/14/22 Kara Gentry Sahasra Belue MPT [facility] [place] [phone]

## 2022-12-14 ENCOUNTER — Other Ambulatory Visit: Payer: Self-pay

## 2022-12-14 ENCOUNTER — Ambulatory Visit (HOSPITAL_COMMUNITY): Payer: BC Managed Care – PPO | Attending: Orthopedic Surgery

## 2022-12-14 DIAGNOSIS — G8929 Other chronic pain: Secondary | ICD-10-CM | POA: Insufficient documentation

## 2022-12-14 DIAGNOSIS — M4722 Other spondylosis with radiculopathy, cervical region: Secondary | ICD-10-CM | POA: Insufficient documentation

## 2022-12-14 DIAGNOSIS — M542 Cervicalgia: Secondary | ICD-10-CM | POA: Diagnosis present

## 2022-12-14 DIAGNOSIS — M792 Neuralgia and neuritis, unspecified: Secondary | ICD-10-CM | POA: Diagnosis not present

## 2022-12-14 DIAGNOSIS — M25511 Pain in right shoulder: Secondary | ICD-10-CM | POA: Insufficient documentation

## 2022-12-14 DIAGNOSIS — M5412 Radiculopathy, cervical region: Secondary | ICD-10-CM | POA: Diagnosis present

## 2022-12-28 ENCOUNTER — Ambulatory Visit (HOSPITAL_COMMUNITY): Payer: BC Managed Care – PPO | Attending: Orthopedic Surgery

## 2022-12-28 ENCOUNTER — Encounter (HOSPITAL_COMMUNITY): Payer: BC Managed Care – PPO

## 2022-12-28 DIAGNOSIS — M25511 Pain in right shoulder: Secondary | ICD-10-CM | POA: Insufficient documentation

## 2022-12-28 DIAGNOSIS — M5412 Radiculopathy, cervical region: Secondary | ICD-10-CM | POA: Diagnosis present

## 2022-12-28 DIAGNOSIS — M542 Cervicalgia: Secondary | ICD-10-CM | POA: Insufficient documentation

## 2022-12-28 DIAGNOSIS — G8929 Other chronic pain: Secondary | ICD-10-CM | POA: Diagnosis present

## 2022-12-28 NOTE — Therapy (Signed)
OUTPATIENT PHYSICAL THERAPY CERVICAL TREATMENT    Patient Name: Kara Gentry MRN: [id number] DOB:March 25, 1975, 47 y.o., female Today's Date: 12/28/2022  END OF SESSION:  PT End of Session - 12/28/22 0719     Visit Number 2    Number of Visits 6    Date for PT Re-Evaluation 01/25/23    Authorization Type [employer] [place]    PT Start Time 0719    PT Stop Time 0800    PT Time Calculation (min) 41 min    Activity Tolerance Patient tolerated treatment well    Behavior During Therapy [facility] for tasks assessed/performed             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   PONV (postoperative nausea and vomiting)    S/P ORIF (open reduction internal fixation) fracture 05/09/17 Ankle  Right     Seasonal allergies    Vitamin D deficiency 05/23/2016   Take 5000 IU vitamin D3 daily   Past Surgical History:  Procedure Laterality Date   APPLICATION OF WOUND VAC  01/03/2012   Procedure: APPLICATION OF WOUND VAC;  Surgeon: Tilda Burrow, MD;  Location: [facility];  Service: Gynecology;  Laterality: N/A;   APPLICATION OF WOUND VAC  01/05/2012   Procedure: APPLICATION OF WOUND VAC;  Surgeon: Tilda Burrow, MD;  Location: [facility];  Service: Gynecology;  Laterality: N/A;   APPLICATION OF WOUND VAC  01/07/2012   Procedure: APPLICATION OF WOUND VAC;  Surgeon: Tilda Burrow, MD;  Location: [facility];  Service: Gynecology;;   APPLICATION OF WOUND VAC  01/11/2012   Procedure: APPLICATION OF WOUND VAC;  Surgeon: Tilda Burrow, MD;  Location: [facility];  Service: Gynecology;  Laterality: N/A;  Fasicial Closure   CESAREAN SECTION  12/22/2011   Procedure: CESAREAN SECTION;  Surgeon: Reva Bores, MD;  Location: [facility];  Service: Obstetrics;  Laterality: N/A;   Primary cesarean section with delivery of Baby A girl at 4. Baby B girl at 63.   CESAREAN SECTION N/A    Phreesia 07/26/2019   DILITATION & CURRETTAGE/HYSTROSCOPY WITH NOVASURE ABLATION N/A 09/04/2016   Procedure: HYSTEROSCOPY WITH NOVASURE ABLATION;  Surgeon: Tilda Burrow, MD;  Location: [facility];  Service: Gynecology;  Laterality: N/A;   ECTOPIC PREGNANCY SURGERY     FRACTURE SURGERY N/A    Phreesia 07/26/2019   INCISION AND DRAINAGE OF WOUND  01/07/2012   Procedure: IRRIGATION AND DEBRIDEMENT WOUND;  Surgeon: Tilda Burrow, MD;  Location: [facility];  Service: Gynecology;;   INSERTION OF MESH  01/03/2012   Procedure: INSERTION OF MESH;  Surgeon: Tilda Burrow, MD;  Location: [facility];  Service: Gynecology;  Laterality: N/A;   MANDIBLE SURGERY Right cancer   Cancerous mass   NASAL SINUS SURGERY     ORIF ANKLE FRACTURE Right 05/09/2017   Procedure: OPEN REDUCTION INTERNAL FIXATION (ORIF) ANKLE FRACTURE;  Surgeon: Vickki Hearing, MD;  Location: [facility];  Service: Orthopedics;  Laterality: Right;   SECONDARY CLOSURE OF WOUND  01/22/2012   Procedure: SECONDARY CLOSURE OF WOUND;  Surgeon: Tilda Burrow, MD;  Location: [facility];  Service: Gynecology;  Laterality: N/A;   WOUND  EXPLORATION  01/03/2012   Necrotizing fascitis at C section incision   WOUND EXPLORATION  01/05/2012   Procedure: WOUND EXPLORATION;  Surgeon: Tilda Burrow, MD;  Location: [facility];  Service: Gynecology;  Laterality: N/A;  Re-Exploration of Wound Abscess with Debridement   WOUND EXPLORATION  01/07/2012   Procedure: WOUND EXPLORATION;  Surgeon: Tilda Burrow, MD;  Location: [facility];  Service: Gynecology;;   WOUND EXPLORATION  01/11/2012   Procedure: WOUND EXPLORATION;  Surgeon: Tilda Burrow, MD;  Location: [facility];  Service: Gynecology;  Laterality: N/A;  Debridment of Wound Abscess   WOUND EXPLORATION  01/22/2012   Procedure: WOUND EXPLORATION;  Surgeon: Tilda Burrow, MD;  Location: [facility];  Service:  Gynecology;  Laterality: N/A;  Delayed Secondary Wound Closure   WRIST SURGERY Left    Cyst removal   Patient Active Problem List   Diagnosis Date Noted   Nasal congestion 11/12/2022   Prediabetes 11/12/2022   Neck muscle spasm 08/26/2022   Chronic right shoulder pain 08/26/2022   Night sweats 04/03/2022   Fatigue 04/03/2022   Obesity, Class II, BMI 35-39.9, 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 01/01/2022   Migraine 09/29/2021   Hx of migraines 07/30/2019   Pelvic pain in female 12/19/2016   Menorrhagia with regular cycle 09/04/2016   Fibroids 05/31/2016   Vitamin D deficiency 05/23/2016   Pelvic pain 05/21/2016    PCP: Gilmore Laroche, FNP  REFERRING PROVIDER: Vickki Hearing, MD  REFERRING DIAG:  Diagnosis  M79.2 (ICD-10-CM) - Radicular pain of right upper extremity  M47.22 (ICD-10-CM) - Cervical spondylosis with radiculopathy    THERAPY DIAG:  Neck pain  Radiculopathy, cervical region  Chronic right shoulder pain  Rationale for Evaluation and Treatment: Rehabilitation  ONSET DATE: 10 years ago/off and on  SUBJECTIVE:                                                                                                                                                                                                         SUBJECTIVE STATEMENT: Patient reports maybe a little less tender than at last visit; has tried sleeping with a roll in her pillow; still painful in right upper trap; has done her exercises but maybe not quite as frequently as suggested. Has some tingling in her right arm when she wakes up in the morning.    Eval:Was coming for here for her shoulder; last visit 9/13 Hand dominance: Right  PERTINENT HISTORY:  History of ankle fx 2019  PAIN:  Are you having pain? Yes: NPRS scale: 3-9/10 Pain location: right shoulder  Pain description: aching, sore Aggravating factors: nighttime Relieving factors: warmth, medication  PRECAUTIONS: None   WEIGHT BEARING RESTRICTIONS:  No  FALLS:  Has patient fallen in last 6 months? No  OCCUPATION: [employer]  PLOF: Independent  PATIENT GOALS: no pain right shoulder  NEXT MD VISIT: 01/21/2023  OBJECTIVE:  Note: Objective measures were completed at Evaluation unless otherwise noted.  DIAGNOSTIC FINDINGS:    Arm and shoulder pain with tingling in the hand radiating from the shoulder and neck area shows straightening of the cervical dosis with C5-6 and C6-7 disc changes   Mid cervical arthrosis as well   Impression cervical spondylosis  PATIENT SURVEYS:  FOTO 73  COGNITION: Overall cognitive status: Within functional limits for tasks assessed  SENSATION: WFL Sometimes right arm "goes to sleep" at night  POSTURE: rounded shoulders, forward head, and increased thoracic kyphosis  PALPATION: Bilateral tightness upper trap   CERVICAL ROM: right upper trap soreness  Active ROM AROM (deg) eval  Flexion 36  Extension 52  Right lateral flexion 45  Left lateral flexion 44  Right rotation 72  Left rotation 74   (Blank rows = not tested)  UPPER EXTREMITY ROM:  Active ROM Right eval Left eval  Shoulder flexion wfl wfl  Shoulder extension    Shoulder abduction    Shoulder adduction    Shoulder extension    Shoulder internal rotation    Shoulder external rotation    Elbow flexion    Elbow extension    Wrist flexion    Wrist extension    Wrist ulnar deviation    Wrist radial deviation    Wrist pronation    Wrist supination     (Blank rows = not tested)  UPPER EXTREMITY MMT:  MMT Right eval Left eval  Shoulder flexion 4+ 5  Shoulder extension    Shoulder abduction 4+ 5  Shoulder adduction    Shoulder extension    Shoulder internal rotation    Shoulder external rotation    Middle trapezius    Lower trapezius    Elbow flexion    Elbow extension    Wrist flexion    Wrist extension    Wrist ulnar deviation    Wrist radial deviation    Wrist pronation    Wrist supination    Grip  strength     (Blank rows = not tested)  CERVICAL SPECIAL TESTS:  Distraction test: Negative  FUNCTIONAL TESTS:    TODAY'S TREATMENT:                                                                                                                              DATE:  12/28/22 Review HEP and goals Sitting: Cervical retractions x 8 Cervical retractions with self overpressure x 8 Supine: Cervical retractions x 8 Scapular retractions with 5" hold x 8 Manual traction 5" hold x 10 Right uppper trap manual sof tissue work   12/14/22 physical therapy evaluation and HEP  instruction   PATIENT EDUCATION:  Education details: Patient educated on exam findings, POC, scope of PT, HEP, and what to expect next visit. Person educated: Patient Education method: Explanation, Demonstration, and Handouts Education comprehension: verbalized understanding, returned demonstration, verbal cues required, and tactile cues required   HOME EXERCISE PROGRAM:  Access Code: [facility] URL: [email or web address] Date: 12/28/2022 Prepared by: [facility] - [facility]  Exercises - Seated Cervical Retraction  - 5 x daily - 7 x weekly - 1 sets - 10 reps - Supine Chin Tuck  - 1 x daily - 7 x weekly - 1 sets - 10 reps - seated posture with towel roll  - 1 x daily - 7 x weekly - 1 sets - 10 reps - Cervical Retraction with Overpressure  - 5 x daily - 7 x weekly - 3 sets - 10 reps - Supine Scapular Retraction  - 2 x daily - 7 x weekly - 1 sets - 10 reps  ASSESSMENT:  CLINICAL IMPRESSION: Today's session started with a review of HEP and goals; patient verbalizes agreement with set rehab goals.  She has decreased pain in the right upper trap with retractions; added overpressure and pain completely resolved.  Also trial of supine cervical retractions; scapular retractions. No pain at end of session; encouraged her to do retractions more frequently.  Patient will benefit from continued skilled therapy services  to  address deficits and promote return to optimal function.      Patient is a 47 y.o. female who was seen today for physical therapy evaluation and treatment for  Diagnosis  M79.2 (ICD-10-CM) - Radicular pain of right upper extremity  M47.22 (ICD-10-CM) - Cervical spondylosis with radiculopathy  Patient demonstrates decreased strength, ROM restriction, reduced flexibility, increased tenderness to palpation and postural abnormalities which are likely contributing to symptoms of pain and are negatively impacting patient ability to perform ADLs. Patient will benefit from skilled physical therapy services to address these deficits to reduce pain and improve level of function with ADLs .   OBJECTIVE IMPAIRMENTS: decreased activity tolerance, decreased mobility, decreased strength, increased fascial restrictions, impaired perceived functional ability, increased muscle spasms, postural dysfunction, and pain.   ACTIVITY LIMITATIONS: carrying, lifting, sitting, reach over head, and caring for others  PARTICIPATION LIMITATIONS: meal prep, cleaning, community activity, and occupation  REHAB POTENTIAL: Good  CLINICAL DECISION MAKING: Stable/uncomplicated  EVALUATION COMPLEXITY: Low   GOALS: Goals reviewed with patient? No  SHORT TERM GOALS: Target date: 01/04/2023  patient will be independent with initial HEP  Baseline:  Goal status: INITIAL  2.  Patient will self report 50% improvement to improve tolerance for functional activity  Baseline:  Goal status: INITIAL   LONG TERM GOALS: Target date: 01/25/2023  Patient will be independent in self management strategies to improve quality of life and functional outcomes.  Baseline:  Goal status: INITIAL  2.  Patient will self report 75% improvement to improve tolerance for functional activity  Baseline:  Goal status: INITIAL  3.  Patient will increase bilateral UE MMTs to 5/5 without pain to promote return to normal reaching and lifting  in home and at work  Baseline: see above Goal status: INITIAL  4.  Patient will increase cervical mobility by 20 degrees grossly to improve ability to scan for safety with driving Baseline:  Goal status: INITIAL  5.  Patient will demonstrate good sitting posture x 15' to drive without neck or shoulder symptoms to work and back Baseline:  Goal status: INITIAL  6.  Patient will  have decreased Right shoulder pain to 2/10 at night to allow her to sleep through the night without pain waking her Baseline:  Goal status: INITIAL   PLAN:  PT FREQUENCY: 1x/week  PT DURATION: 6 weeks  PLANNED INTERVENTIONS: 97164- PT Re-evaluation, 97110-Therapeutic exercises, 97530- Therapeutic activity, 97112- Neuromuscular re-education, 97535- Self Care, 10272- Manual therapy, [phone]- Gait training, [phone]- Orthotic Fit/training, [phone]- Canalith repositioning, [id number]- Aquatic Therapy, [phone]- Splinting, Patient/Family education, Balance training, Stair training, Taping, Dry Needling, Joint mobilization, Joint manipulation, Spinal manipulation, Spinal mobilization, Scar mobilization, and DME instructions.   PLAN FOR NEXT SESSION:  monitor reaction to cervical retractions; looking to centralize symptoms; manual as needed; postural strengthening.   8:02 AM, 12/28/22 Akiba Melfi Small Tam Savoia MPT [facility] [place] [phone] [phone]

## 2023-01-04 ENCOUNTER — Ambulatory Visit (HOSPITAL_COMMUNITY): Payer: BC Managed Care – PPO

## 2023-01-04 DIAGNOSIS — M542 Cervicalgia: Secondary | ICD-10-CM

## 2023-01-04 DIAGNOSIS — M5412 Radiculopathy, cervical region: Secondary | ICD-10-CM

## 2023-01-04 NOTE — Therapy (Addendum)
OUTPATIENT PHYSICAL THERAPY CERVICAL TREATMENT    Patient Name: Kara Gentry MRN: [id number] DOB:May 09, 1975, 47 y.o., female Today's Date: 01/04/2023  END OF SESSION:  PT End of Session - 01/04/23 0722     Visit Number 3    Number of Visits 6    Date for PT Re-Evaluation 01/25/23    Authorization Type [employer] of [place]    PT Start Time [phone]    PT Stop Time 0800    PT Time Calculation (min) 42 min    Activity Tolerance Patient tolerated treatment well    Behavior During Therapy [facility] for tasks assessed/performed             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   PONV (postoperative nausea and vomiting)    S/P ORIF (open reduction internal fixation) fracture 05/09/17 Ankle  Right     Seasonal allergies    Vitamin D deficiency 05/23/2016   Take 5000 IU vitamin D3 daily   Past Surgical History:  Procedure Laterality Date   APPLICATION OF WOUND VAC  01/03/2012   Procedure: APPLICATION OF WOUND VAC;  Surgeon: Tilda Burrow, MD;  Location: [facility];  Service: Gynecology;  Laterality: N/A;   APPLICATION OF WOUND VAC  01/05/2012   Procedure: APPLICATION OF WOUND VAC;  Surgeon: Tilda Burrow, MD;  Location: [facility];  Service: Gynecology;  Laterality: N/A;   APPLICATION OF WOUND VAC  01/07/2012   Procedure: APPLICATION OF WOUND VAC;  Surgeon: Tilda Burrow, MD;  Location: [facility];  Service: Gynecology;;   APPLICATION OF WOUND VAC  01/11/2012   Procedure: APPLICATION OF WOUND VAC;  Surgeon: Tilda Burrow, MD;  Location: [facility];  Service: Gynecology;  Laterality: N/A;  Fasicial Closure   CESAREAN SECTION  12/22/2011   Procedure: CESAREAN SECTION;  Surgeon: Reva Bores, MD;  Location: [facility];  Service: Obstetrics;  Laterality: N/A;   Primary cesarean section with delivery of Baby A girl at 15. Baby B girl at 19.   CESAREAN SECTION N/A    Phreesia 07/26/2019   DILITATION & CURRETTAGE/HYSTROSCOPY WITH NOVASURE ABLATION N/A 09/04/2016   Procedure: HYSTEROSCOPY WITH NOVASURE ABLATION;  Surgeon: Tilda Burrow, MD;  Location: [facility];  Service: Gynecology;  Laterality: N/A;   ECTOPIC PREGNANCY SURGERY     FRACTURE SURGERY N/A    Phreesia 07/26/2019   INCISION AND DRAINAGE OF WOUND  01/07/2012   Procedure: IRRIGATION AND DEBRIDEMENT WOUND;  Surgeon: Tilda Burrow, MD;  Location: [facility];  Service: Gynecology;;   INSERTION OF MESH  01/03/2012   Procedure: INSERTION OF MESH;  Surgeon: Tilda Burrow, MD;  Location: [facility];  Service: Gynecology;  Laterality: N/A;   MANDIBLE SURGERY Right cancer   Cancerous mass   NASAL SINUS SURGERY     ORIF ANKLE FRACTURE Right 05/09/2017   Procedure: OPEN REDUCTION INTERNAL FIXATION (ORIF) ANKLE FRACTURE;  Surgeon: Vickki Hearing, MD;  Location: [facility];  Service: Orthopedics;  Laterality: Right;   SECONDARY CLOSURE OF WOUND  01/22/2012   Procedure: SECONDARY CLOSURE OF WOUND;  Surgeon: Tilda Burrow, MD;  Location: [facility];  Service: Gynecology;  Laterality: N/A;   WOUND  EXPLORATION  01/03/2012   Necrotizing fascitis at C section incision   WOUND EXPLORATION  01/05/2012   Procedure: WOUND EXPLORATION;  Surgeon: Tilda Burrow, MD;  Location: [facility];  Service: Gynecology;  Laterality: N/A;  Re-Exploration of Wound Abscess with Debridement   WOUND EXPLORATION  01/07/2012   Procedure: WOUND EXPLORATION;  Surgeon: Tilda Burrow, MD;  Location: [facility];  Service: Gynecology;;   WOUND EXPLORATION  01/11/2012   Procedure: WOUND EXPLORATION;  Surgeon: Tilda Burrow, MD;  Location: [facility];  Service: Gynecology;  Laterality: N/A;  Debridment of Wound Abscess   WOUND EXPLORATION  01/22/2012   Procedure: WOUND EXPLORATION;  Surgeon: Tilda Burrow, MD;  Location: [facility];  Service:  Gynecology;  Laterality: N/A;  Delayed Secondary Wound Closure   WRIST SURGERY Left    Cyst removal   Patient Active Problem List   Diagnosis Date Noted   Nasal congestion 11/12/2022   Prediabetes 11/12/2022   Neck muscle spasm 08/26/2022   Chronic right shoulder pain 08/26/2022   Night sweats 04/03/2022   Fatigue 04/03/2022   Obesity, Class II, BMI 35-39.9, 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 01/01/2022   Migraine 09/29/2021   Hx of migraines 07/30/2019   Pelvic pain in female 12/19/2016   Menorrhagia with regular cycle 09/04/2016   Fibroids 05/31/2016   Vitamin D deficiency 05/23/2016   Pelvic pain 05/21/2016    PCP: Gilmore Laroche, FNP  REFERRING PROVIDER: Vickki Hearing, MD  REFERRING DIAG:  Diagnosis  M79.2 (ICD-10-CM) - Radicular pain of right upper extremity  M47.22 (ICD-10-CM) - Cervical spondylosis with radiculopathy    THERAPY DIAG:  Neck pain  Radiculopathy, cervical region  Rationale for Evaluation and Treatment: Rehabilitation  ONSET DATE: 10 years ago/off and on  SUBJECTIVE:                                                                                                                                                                                                         SUBJECTIVE STATEMENT: 3-4/10 right upper upper trap pain on arrival; she reports generally she is feeling better; sleeping better; less numbness at night.  She had no pain after last treatment with traction and retractions but it returns.  Eval:Was coming for here for her shoulder; last visit 9/13 Hand dominance: Right  PERTINENT HISTORY:  History of ankle fx 2019  PAIN:  Are you having pain? Yes: NPRS scale: 3-9/10 Pain location: right shoulder Pain description: aching, sore Aggravating factors: nighttime Relieving factors: warmth, medication  PRECAUTIONS: None   WEIGHT BEARING RESTRICTIONS: No  FALLS:  Has patient fallen in last 6 months? No  OCCUPATION: [employer]  PLOF:  Independent  PATIENT GOALS: no pain right shoulder  NEXT MD VISIT: 01/21/2023  OBJECTIVE:  Note: Objective measures were completed at Evaluation unless otherwise noted.  DIAGNOSTIC FINDINGS:    Arm and shoulder pain with tingling in the hand radiating from the shoulder and neck area shows straightening of the cervical dosis with C5-6 and C6-7 disc changes   Mid cervical arthrosis as well   Impression cervical spondylosis  PATIENT SURVEYS:  FOTO 73  COGNITION: Overall cognitive status: Within functional limits for tasks assessed  SENSATION: WFL Sometimes right arm "goes to sleep" at night  POSTURE: rounded shoulders, forward head, and increased thoracic kyphosis  PALPATION: Bilateral tightness upper trap   CERVICAL ROM: right upper trap soreness  Active ROM AROM (deg) eval  Flexion 36  Extension 52  Right lateral flexion 45  Left lateral flexion 44  Right rotation 72  Left rotation 74   (Blank rows = not tested)  UPPER EXTREMITY ROM:  Active ROM Right eval Left eval  Shoulder flexion wfl wfl  Shoulder extension    Shoulder abduction    Shoulder adduction    Shoulder extension    Shoulder internal rotation    Shoulder external rotation    Elbow flexion    Elbow extension    Wrist flexion    Wrist extension    Wrist ulnar deviation    Wrist radial deviation    Wrist pronation    Wrist supination     (Blank rows = not tested)  UPPER EXTREMITY MMT:  MMT Right eval Left eval  Shoulder flexion 4+ 5  Shoulder extension    Shoulder abduction 4+ 5  Shoulder adduction    Shoulder extension    Shoulder internal rotation    Shoulder external rotation    Middle trapezius    Lower trapezius    Elbow flexion    Elbow extension    Wrist flexion    Wrist extension    Wrist ulnar deviation    Wrist radial deviation    Wrist pronation    Wrist supination    Grip strength     (Blank rows = not tested)  CERVICAL SPECIAL TESTS:  Distraction test:  Negative  FUNCTIONAL TESTS:    TODAY'S TREATMENT:                                                                                                                              DATE:  01/04/23 Sitting: Cervical retractions x 10 Cervical retractions with overpressure x 10 Supine: Cervical retractions x 10 Cervical retractions with overpressure x 10 Manual traction 10" hold x 20 reps Discussion of cervical traction device  12/28/22 Review HEP and goals Sitting: Cervical retractions x 8 Cervical retractions with self overpressure x 8 Supine: Cervical retractions x 8 Scapular retractions with 5" hold x 8 Manual traction 5" hold x 10 Right uppper  trap manual sof tissue work   12/14/22 physical therapy evaluation and HEP instruction   PATIENT EDUCATION:  Education details: Patient educated on exam findings, POC, scope of PT, HEP, and what to expect next visit. Person educated: Patient Education method: Explanation, Demonstration, and Handouts Education comprehension: verbalized understanding, returned demonstration, verbal cues required, and tactile cues required   HOME EXERCISE PROGRAM:  Access Code: [facility] URL: [email or web address] Date: 12/28/2022 Prepared by: [facility]  Exercises - Seated Cervical Retraction  - 5 x daily - 7 x weekly - 1 sets - 10 reps - Supine Chin Tuck  - 1 x daily - 7 x weekly - 1 sets - 10 reps - seated posture with towel roll  - 1 x daily - 7 x weekly - 1 sets - 10 reps - Cervical Retraction with Overpressure  - 5 x daily - 7 x weekly - 3 sets - 10 reps - Supine Scapular Retraction  - 2 x daily - 7 x weekly - 1 sets - 10 reps  ASSESSMENT:  CLINICAL IMPRESSION: Today's session focused on decreasing radicular pain with cervical retractions and manual traction. At the end of treatment patient is painfree.  Discussed with her using a home traction unit as the combination of exercise and traction abolishes her pain but it returns  between treatments.  Will send request to [employer] to order home traction unit.  Patient will benefit from continued skilled therapy services  to address deficits and promote return to optimal function.      Patient is a 47 y.o. female who was seen today for physical therapy evaluation and treatment for  Diagnosis  M79.2 (ICD-10-CM) - Radicular pain of right upper extremity  M47.22 (ICD-10-CM) - Cervical spondylosis with radiculopathy  Patient demonstrates decreased strength, ROM restriction, reduced flexibility, increased tenderness to palpation and postural abnormalities which are likely contributing to symptoms of pain and are negatively impacting patient ability to perform ADLs. Patient will benefit from skilled physical therapy services to address these deficits to reduce pain and improve level of function with ADLs .   OBJECTIVE IMPAIRMENTS: decreased activity tolerance, decreased mobility, decreased strength, increased fascial restrictions, impaired perceived functional ability, increased muscle spasms, postural dysfunction, and pain.   ACTIVITY LIMITATIONS: carrying, lifting, sitting, reach over head, and caring for others  PARTICIPATION LIMITATIONS: meal prep, cleaning, community activity, and occupation  REHAB POTENTIAL: Good  CLINICAL DECISION MAKING: Stable/uncomplicated  EVALUATION COMPLEXITY: Low   GOALS: Goals reviewed with patient? No  SHORT TERM GOALS: Target date: 01/04/2023  patient will be independent with initial HEP  Baseline:  Goal status: INITIAL  2.  Patient will self report 50% improvement to improve tolerance for functional activity  Baseline:  Goal status: INITIAL   LONG TERM GOALS: Target date: 01/25/2023  Patient will be independent in self management strategies to improve quality of life and functional outcomes.  Baseline:  Goal status: INITIAL  2.  Patient will self report 75% improvement to improve tolerance for functional  activity  Baseline:  Goal status: INITIAL  3.  Patient will increase bilateral UE MMTs to 5/5 without pain to promote return to normal reaching and lifting in home and at work  Baseline: see above Goal status: INITIAL  4.  Patient will increase cervical mobility by 20 degrees grossly to improve ability to scan for safety with driving Baseline:  Goal status: INITIAL  5.  Patient will demonstrate good sitting posture x 15' to drive without neck or shoulder symptoms to work  and back Baseline:  Goal status: INITIAL  6.  Patient will have decreased Right shoulder pain to 2/10 at night to allow her to sleep through the night without pain waking her Baseline:  Goal status: INITIAL   PLAN:  PT FREQUENCY: 1x/week  PT DURATION: 6 weeks  PLANNED INTERVENTIONS: [id number]- PT Re-evaluation, 97110-Therapeutic exercises, 97530- Therapeutic activity, 97112- Neuromuscular re-education, 97535- Self Care, 16109- Manual therapy, [phone]- Gait training, [phone]- Orthotic Fit/training, [phone]- Canalith repositioning, [id number]- Aquatic Therapy, [phone]- Splinting, Patient/Family education, Balance training, Stair training, Taping, Dry Needling, Joint mobilization, Joint manipulation, Spinal manipulation, Spinal mobilization, Scar mobilization, and DME instructions.   PLAN FOR NEXT SESSION:  monitor reaction to cervical retractions; looking to centralize symptoms; manual as needed; postural strengthening.   8:11 AM, 01/04/23 [place] Gilkes Small Haniah Penny MPT [facility] [place] [phone]

## 2023-01-11 ENCOUNTER — Ambulatory Visit (HOSPITAL_COMMUNITY): Payer: BC Managed Care – PPO

## 2023-01-11 DIAGNOSIS — M5412 Radiculopathy, cervical region: Secondary | ICD-10-CM

## 2023-01-11 DIAGNOSIS — M542 Cervicalgia: Secondary | ICD-10-CM

## 2023-01-11 DIAGNOSIS — G8929 Other chronic pain: Secondary | ICD-10-CM

## 2023-01-11 NOTE — Therapy (Signed)
OUTPATIENT PHYSICAL THERAPY CERVICAL TREATMENT    Patient Name: Kara Gentry MRN: [id number] DOB:[birth year], 47 y.o., female Today's Date: 01/11/2023  END OF SESSION:  PT End of Session - 01/11/23 0731     Visit Number 4    Number of Visits 6    Date for PT Re-Evaluation 01/25/23    Authorization Type [employer] of [place]    PT Start Time 0720    PT Stop Time 0800    PT Time Calculation (min) 40 min    Activity Tolerance Patient tolerated treatment well    Behavior During Therapy [facility] for tasks assessed/performed             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   PONV (postoperative nausea and vomiting)    S/P ORIF (open reduction internal fixation) fracture 05/09/17 Ankle  Right     Seasonal allergies    Vitamin D deficiency 05/23/2016   Take 5000 IU vitamin D3 daily   Past Surgical History:  Procedure Laterality Date   APPLICATION OF WOUND VAC  01/03/2012   Procedure: APPLICATION OF WOUND VAC;  Surgeon: Tilda Burrow, MD;  Location: [facility];  Service: Gynecology;  Laterality: N/A;   APPLICATION OF WOUND VAC  01/05/2012   Procedure: APPLICATION OF WOUND VAC;  Surgeon: Tilda Burrow, MD;  Location: [facility];  Service: Gynecology;  Laterality: N/A;   APPLICATION OF WOUND VAC  01/07/2012   Procedure: APPLICATION OF WOUND VAC;  Surgeon: Tilda Burrow, MD;  Location: [facility];  Service: Gynecology;;   APPLICATION OF WOUND VAC  01/11/2012   Procedure: APPLICATION OF WOUND VAC;  Surgeon: Tilda Burrow, MD;  Location: [facility];  Service: Gynecology;  Laterality: N/A;  Fasicial Closure   CESAREAN SECTION  12/22/2011   Procedure: CESAREAN SECTION;  Surgeon: Reva Bores, MD;  Location: [facility];  Service: Obstetrics;  Laterality: N/A;   Primary cesarean section with delivery of Baby A girl at 74. Baby B girl at 74.   CESAREAN SECTION N/A    Phreesia 07/26/2019   DILITATION & CURRETTAGE/HYSTROSCOPY WITH NOVASURE ABLATION N/A 09/04/2016   Procedure: HYSTEROSCOPY WITH NOVASURE ABLATION;  Surgeon: Tilda Burrow, MD;  Location: [facility];  Service: Gynecology;  Laterality: N/A;   ECTOPIC PREGNANCY SURGERY     FRACTURE SURGERY N/A    Phreesia 07/26/2019   INCISION AND DRAINAGE OF WOUND  01/07/2012   Procedure: IRRIGATION AND DEBRIDEMENT WOUND;  Surgeon: Tilda Burrow, MD;  Location: [facility];  Service: Gynecology;;   INSERTION OF MESH  01/03/2012   Procedure: INSERTION OF MESH;  Surgeon: Tilda Burrow, MD;  Location: [facility];  Service: Gynecology;  Laterality: N/A;   MANDIBLE SURGERY Right cancer   Cancerous mass   NASAL SINUS SURGERY     ORIF ANKLE FRACTURE Right 05/09/2017   Procedure: OPEN REDUCTION INTERNAL FIXATION (ORIF) ANKLE FRACTURE;  Surgeon: Vickki Hearing, MD;  Location: [facility];  Service: Orthopedics;  Laterality: Right;   SECONDARY CLOSURE OF WOUND  01/22/2012   Procedure: SECONDARY CLOSURE OF WOUND;  Surgeon: Tilda Burrow, MD;  Location: [facility];  Service: Gynecology;  Laterality: N/A;   WOUND  EXPLORATION  01/03/2012   Necrotizing fascitis at C section incision   WOUND EXPLORATION  01/05/2012   Procedure: WOUND EXPLORATION;  Surgeon: Tilda Burrow, MD;  Location: [facility];  Service: Gynecology;  Laterality: N/A;  Re-Exploration of Wound Abscess with Debridement   WOUND EXPLORATION  01/07/2012   Procedure: WOUND EXPLORATION;  Surgeon: Tilda Burrow, MD;  Location: [facility];  Service: Gynecology;;   WOUND EXPLORATION  01/11/2012   Procedure: WOUND EXPLORATION;  Surgeon: Tilda Burrow, MD;  Location: [facility];  Service: Gynecology;  Laterality: N/A;  Debridment of Wound Abscess   WOUND EXPLORATION  01/22/2012   Procedure: WOUND EXPLORATION;  Surgeon: Tilda Burrow, MD;  Location: [facility];  Service:  Gynecology;  Laterality: N/A;  Delayed Secondary Wound Closure   WRIST SURGERY Left    Cyst removal   Patient Active Problem List   Diagnosis Date Noted   Nasal congestion 11/12/2022   Prediabetes 11/12/2022   Neck muscle spasm 08/26/2022   Chronic right shoulder pain 08/26/2022   Night sweats 04/03/2022   Fatigue 04/03/2022   Obesity, Class II, BMI 35-39.9, 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 01/01/2022   Migraine 09/29/2021   Hx of migraines 07/30/2019   Pelvic pain in female 12/19/2016   Menorrhagia with regular cycle 09/04/2016   Fibroids 05/31/2016   Vitamin D deficiency 05/23/2016   Pelvic pain 05/21/2016    PCP: Gilmore Laroche, FNP  REFERRING PROVIDER: Vickki Hearing, MD  REFERRING DIAG:  Diagnosis  M79.2 (ICD-10-CM) - Radicular pain of right upper extremity  M47.22 (ICD-10-CM) - Cervical spondylosis with radiculopathy    THERAPY DIAG:  Neck pain  Radiculopathy, cervical region  Chronic right shoulder pain  Rationale for Evaluation and Treatment: Rehabilitation  ONSET DATE: 10 years ago/off and on  SUBJECTIVE:                                                                                                                                                                                                         SUBJECTIVE STATEMENT: Minimal pain right upper trap today; has to move her arm a certain way to hurt; she is waiting on arrival of cervical traction unit.   Eval:Was coming for here for her shoulder; last visit 9/13 Hand dominance: Right  PERTINENT HISTORY:  History of ankle fx 2019  PAIN:  Are you having pain? Yes: NPRS scale: 3-9/10 Pain location: right shoulder Pain description: aching, sore Aggravating factors: nighttime Relieving factors: warmth, medication  PRECAUTIONS: None   WEIGHT BEARING RESTRICTIONS: No  FALLS:  Has patient fallen in last  6 months? No  OCCUPATION: [employer]  PLOF: Independent  PATIENT GOALS: no pain right shoulder  NEXT  MD VISIT: 01/21/2023  OBJECTIVE:  Note: Objective measures were completed at Evaluation unless otherwise noted.  DIAGNOSTIC FINDINGS:    Arm and shoulder pain with tingling in the hand radiating from the shoulder and neck area shows straightening of the cervical dosis with C5-6 and C6-7 disc changes   Mid cervical arthrosis as well   Impression cervical spondylosis  PATIENT SURVEYS:  FOTO 73  COGNITION: Overall cognitive status: Within functional limits for tasks assessed  SENSATION: WFL Sometimes right arm "goes to sleep" at night  POSTURE: rounded shoulders, forward head, and increased thoracic kyphosis  PALPATION: Bilateral tightness upper trap   CERVICAL ROM: right upper trap soreness  Active ROM AROM (deg) eval  Flexion 36  Extension 52  Right lateral flexion 45  Left lateral flexion 44  Right rotation 72  Left rotation 74   (Blank rows = not tested)  UPPER EXTREMITY ROM:  Active ROM Right eval Left eval  Shoulder flexion wfl wfl  Shoulder extension    Shoulder abduction    Shoulder adduction    Shoulder extension    Shoulder internal rotation    Shoulder external rotation    Elbow flexion    Elbow extension    Wrist flexion    Wrist extension    Wrist ulnar deviation    Wrist radial deviation    Wrist pronation    Wrist supination     (Blank rows = not tested)  UPPER EXTREMITY MMT:  MMT Right eval Left eval  Shoulder flexion 4+ 5  Shoulder extension    Shoulder abduction 4+ 5  Shoulder adduction    Shoulder extension    Shoulder internal rotation    Shoulder external rotation    Middle trapezius    Lower trapezius    Elbow flexion    Elbow extension    Wrist flexion    Wrist extension    Wrist ulnar deviation    Wrist radial deviation    Wrist pronation    Wrist supination    Grip strength     (Blank rows = not tested)  CERVICAL SPECIAL TESTS:  Distraction test: Negative  FUNCTIONAL TESTS:    TODAY'S TREATMENT:                                                                                                                               DATE:  01/11/23 Supine: Moist heat cervical spine and upper trap x 5' to decrease muscle tightness Manual traction 10" hold x 20 reps Cervical retraction x 10 Cervical retraction with overpressure x 10   01/04/23 Sitting: Cervical retractions x 10 Cervical retractions with overpressure x 10 Supine: Cervical retractions x 10 Cervical retractions with overpressure x 10 Manual traction 10" hold x 20 reps Discussion of cervical traction device  12/28/22 Review HEP and goals Sitting: Cervical retractions  x 8 Cervical retractions with self overpressure x 8 Supine: Cervical retractions x 8 Scapular retractions with 5" hold x 8 Manual traction 5" hold x 10 Right uppper trap manual sof tissue work   12/14/22 physical therapy evaluation and HEP instruction   PATIENT EDUCATION:  Education details: Patient educated on exam findings, POC, scope of PT, HEP, and what to expect next visit. Person educated: Patient Education method: Explanation, Demonstration, and Handouts Education comprehension: verbalized understanding, returned demonstration, verbal cues required, and tactile cues required   HOME EXERCISE PROGRAM:  Access Code: [facility] URL: [email or web address] Date: 12/28/2022 Prepared by: [facility]  Exercises - Seated Cervical Retraction  - 5 x daily - 7 x weekly - 1 sets - 10 reps - Supine Chin Tuck  - 1 x daily - 7 x weekly - 1 sets - 10 reps - seated posture with towel roll  - 1 x daily - 7 x weekly - 1 sets - 10 reps - Cervical Retraction with Overpressure  - 5 x daily - 7 x weekly - 3 sets - 10 reps - Supine Scapular Retraction  - 2 x daily - 7 x weekly - 1 sets - 10 reps  ASSESSMENT:  CLINICAL IMPRESSION: Today's session focused on decreasing radicular pain with cervical retractions and manual traction. Patient with no pain at the end  of treatment.  Has not received her home traction unit yet so asked her to bring it in next visit and we will go over how to use it and will likely discharge her at that point as her symptoms are currently being managed.   Patient will benefit from continued skilled therapy services  to address deficits and promote return to optimal function.      Patient is a 47 y.o. female who was seen today for physical therapy evaluation and treatment for  Diagnosis  M79.2 (ICD-10-CM) - Radicular pain of right upper extremity  M47.22 (ICD-10-CM) - Cervical spondylosis with radiculopathy  Patient demonstrates decreased strength, ROM restriction, reduced flexibility, increased tenderness to palpation and postural abnormalities which are likely contributing to symptoms of pain and are negatively impacting patient ability to perform ADLs. Patient will benefit from skilled physical therapy services to address these deficits to reduce pain and improve level of function with ADLs .   OBJECTIVE IMPAIRMENTS: decreased activity tolerance, decreased mobility, decreased strength, increased fascial restrictions, impaired perceived functional ability, increased muscle spasms, postural dysfunction, and pain.   ACTIVITY LIMITATIONS: carrying, lifting, sitting, reach over head, and caring for others  PARTICIPATION LIMITATIONS: meal prep, cleaning, community activity, and occupation  REHAB POTENTIAL: Good  CLINICAL DECISION MAKING: Stable/uncomplicated  EVALUATION COMPLEXITY: Low   GOALS: Goals reviewed with patient? No  SHORT TERM GOALS: Target date: 01/04/2023  patient will be independent with initial HEP  Baseline:  Goal status: INITIAL  2.  Patient will self report 50% improvement to improve tolerance for functional activity  Baseline:  Goal status: INITIAL   LONG TERM GOALS: Target date: 01/25/2023  Patient will be independent in self management strategies to improve quality of life and functional  outcomes.  Baseline:  Goal status: INITIAL  2.  Patient will self report 75% improvement to improve tolerance for functional activity  Baseline:  Goal status: INITIAL  3.  Patient will increase bilateral UE MMTs to 5/5 without pain to promote return to normal reaching and lifting in home and at work  Baseline: see above Goal status: INITIAL  4.  Patient will increase cervical mobility  by 20 degrees grossly to improve ability to scan for safety with driving Baseline:  Goal status: INITIAL  5.  Patient will demonstrate good sitting posture x 15' to drive without neck or shoulder symptoms to work and back Baseline:  Goal status: INITIAL  6.  Patient will have decreased Right shoulder pain to 2/10 at night to allow her to sleep through the night without pain waking her Baseline:  Goal status: INITIAL   PLAN:  PT FREQUENCY: 1x/week  PT DURATION: 6 weeks  PLANNED INTERVENTIONS: [id number]- PT Re-evaluation, 97110-Therapeutic exercises, 97530- Therapeutic activity, 97112- Neuromuscular re-education, 97535- Self Care, [id number]- Manual therapy, [phone]- Gait training, [phone]- Orthotic Fit/training, [phone]- Canalith repositioning, [id number]- Aquatic Therapy, [phone]- Splinting, Patient/Family education, Balance training, Stair training, Taping, Dry Needling, Joint mobilization, Joint manipulation, Spinal manipulation, Spinal mobilization, Scar mobilization, and DME instructions.   PLAN FOR NEXT SESSION:  monitor reaction to cervical retractions; looking to centralize symptoms; manual as needed; postural strengthening. Reassess next visit   7:52 AM, 01/11/23 Quayshaun Hubbert Small Zelma Mazariego MPT [facility] [place] [phone]

## 2023-01-14 ENCOUNTER — Ambulatory Visit: Payer: BC Managed Care – PPO | Admitting: Orthopedic Surgery

## 2023-01-15 ENCOUNTER — Ambulatory Visit (HOSPITAL_COMMUNITY): Payer: BC Managed Care – PPO

## 2023-01-15 DIAGNOSIS — M5412 Radiculopathy, cervical region: Secondary | ICD-10-CM

## 2023-01-15 DIAGNOSIS — G8929 Other chronic pain: Secondary | ICD-10-CM

## 2023-01-15 DIAGNOSIS — M542 Cervicalgia: Secondary | ICD-10-CM | POA: Diagnosis not present

## 2023-01-15 NOTE — Therapy (Signed)
OUTPATIENT PHYSICAL THERAPY CERVICAL TREATMENT    Patient Name: Kara Gentry MRN: [id number] DOB:06/09/1975, 47 y.o., female Today's Date: 01/15/2023  END OF SESSION:  PT End of Session - 01/15/23 0721     Visit Number 5    Number of Visits 6    Date for PT Re-Evaluation 01/25/23    Authorization Type [employer] of     PT Start Time 0720    PT Stop Time 0745    PT Time Calculation (min) 25 min    Activity Tolerance Patient tolerated treatment well    Behavior During Therapy [facility] for tasks assessed/performed             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   PONV (postoperative nausea and vomiting)    S/P ORIF (open reduction internal fixation) fracture 05/09/17 Ankle  Right     Seasonal allergies    Vitamin D deficiency 05/23/2016   Take 5000 IU vitamin D3 daily   Past Surgical History:  Procedure Laterality Date   APPLICATION OF WOUND VAC  01/03/2012   Procedure: APPLICATION OF WOUND VAC;  Surgeon: Tilda Burrow, MD;  Location: [facility];  Service: Gynecology;  Laterality: N/A;   APPLICATION OF WOUND VAC  01/05/2012   Procedure: APPLICATION OF WOUND VAC;  Surgeon: Tilda Burrow, MD;  Location: [facility];  Service: Gynecology;  Laterality: N/A;   APPLICATION OF WOUND VAC  01/07/2012   Procedure: APPLICATION OF WOUND VAC;  Surgeon: Tilda Burrow, MD;  Location: [facility];  Service: Gynecology;;   APPLICATION OF WOUND VAC  01/11/2012   Procedure: APPLICATION OF WOUND VAC;  Surgeon: Tilda Burrow, MD;  Location: [facility];  Service: Gynecology;  Laterality: N/A;  Fasicial Closure   CESAREAN SECTION  12/22/2011   Procedure: CESAREAN SECTION;  Surgeon: Reva Bores, MD;  Location: [facility];  Service: Obstetrics;  Laterality: N/A;   Primary cesarean section with delivery of Baby A girl at 17. Baby B girl at 48.   CESAREAN SECTION N/A    Phreesia 07/26/2019   DILITATION & CURRETTAGE/HYSTROSCOPY WITH NOVASURE ABLATION N/A 09/04/2016   Procedure: HYSTEROSCOPY WITH NOVASURE ABLATION;  Surgeon: Tilda Burrow, MD;  Location: [facility];  Service: Gynecology;  Laterality: N/A;   ECTOPIC PREGNANCY SURGERY     FRACTURE SURGERY N/A    Phreesia 07/26/2019   INCISION AND DRAINAGE OF WOUND  01/07/2012   Procedure: IRRIGATION AND DEBRIDEMENT WOUND;  Surgeon: Tilda Burrow, MD;  Location: [facility];  Service: Gynecology;;   INSERTION OF MESH  01/03/2012   Procedure: INSERTION OF MESH;  Surgeon: Tilda Burrow, MD;  Location: [facility];  Service: Gynecology;  Laterality: N/A;   MANDIBLE SURGERY Right cancer   Cancerous mass   NASAL SINUS SURGERY     ORIF ANKLE FRACTURE Right 05/09/2017   Procedure: OPEN REDUCTION INTERNAL FIXATION (ORIF) ANKLE FRACTURE;  Surgeon: Vickki Hearing, MD;  Location: [facility];  Service: Orthopedics;  Laterality: Right;   SECONDARY CLOSURE OF WOUND  01/22/2012   Procedure: SECONDARY CLOSURE OF WOUND;  Surgeon: Tilda Burrow, MD;  Location: [facility];  Service: Gynecology;  Laterality: N/A;   WOUND  EXPLORATION  01/03/2012   Necrotizing fascitis at C section incision   WOUND EXPLORATION  01/05/2012   Procedure: WOUND EXPLORATION;  Surgeon: Tilda Burrow, MD;  Location: [facility];  Service: Gynecology;  Laterality: N/A;  Re-Exploration of Wound Abscess with Debridement   WOUND EXPLORATION  01/07/2012   Procedure: WOUND EXPLORATION;  Surgeon: Tilda Burrow, MD;  Location: [facility];  Service: Gynecology;;   WOUND EXPLORATION  01/11/2012   Procedure: WOUND EXPLORATION;  Surgeon: Tilda Burrow, MD;  Location: [facility];  Service: Gynecology;  Laterality: N/A;  Debridment of Wound Abscess   WOUND EXPLORATION  01/22/2012   Procedure: WOUND EXPLORATION;  Surgeon: Tilda Burrow, MD;  Location: [facility];  Service:  Gynecology;  Laterality: N/A;  Delayed Secondary Wound Closure   WRIST SURGERY Left    Cyst removal   Patient Active Problem List   Diagnosis Date Noted   Nasal congestion 11/12/2022   Prediabetes 11/12/2022   Neck muscle spasm 08/26/2022   Chronic right shoulder pain 08/26/2022   Night sweats 04/03/2022   Fatigue 04/03/2022   Obesity, Class II, BMI 35-39.9, 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 01/01/2022   Migraine 09/29/2021   Hx of migraines 07/30/2019   Pelvic pain in female 12/19/2016   Menorrhagia with regular cycle 09/04/2016   Fibroids 05/31/2016   Vitamin D deficiency 05/23/2016   Pelvic pain 05/21/2016    PCP: Gilmore Laroche, FNP  REFERRING PROVIDER: Vickki Hearing, MD  REFERRING DIAG:  Diagnosis  M79.2 (ICD-10-CM) - Radicular pain of right upper extremity  M47.22 (ICD-10-CM) - Cervical spondylosis with radiculopathy    THERAPY DIAG:  Neck pain  Radiculopathy, cervical region  Chronic right shoulder pain  Rationale for Evaluation and Treatment: Rehabilitation  ONSET DATE: 10 years ago/off and on  SUBJECTIVE:                                                                                                                                                                                                         SUBJECTIVE STATEMENT: "85%" better overall. She is not sleeping through the night although she did wake up this morning laying on her right shoulder; had some tingling in her right hand.     Eval:Was coming for here for her shoulder; last visit 9/13 Hand dominance: Right  PERTINENT HISTORY:  History of ankle fx 2019  PAIN:  Are you having pain? Yes: NPRS scale: 3-9/10 Pain location: right shoulder Pain description: aching, sore Aggravating factors: nighttime Relieving factors: warmth, medication  PRECAUTIONS: None   WEIGHT BEARING RESTRICTIONS: No  FALLS:  Has patient fallen in last 6 months? No  OCCUPATION: [employer]  PLOF: Independent  PATIENT  GOALS: no pain right shoulder  NEXT MD VISIT: 01/21/2023  OBJECTIVE:  Note: Objective measures were completed at Evaluation unless otherwise noted.  DIAGNOSTIC FINDINGS:    Arm and shoulder pain with tingling in the hand radiating from the shoulder and neck area shows straightening of the cervical dosis with C5-6 and C6-7 disc changes   Mid cervical arthrosis as well   Impression cervical spondylosis  PATIENT SURVEYS:  FOTO 73  COGNITION: Overall cognitive status: Within functional limits for tasks assessed  SENSATION: [facility] Sometimes right arm "goes to sleep" at night  POSTURE: rounded shoulders, forward head, and increased thoracic kyphosis  PALPATION: Bilateral tightness upper trap   CERVICAL ROM: right upper trap soreness  Active ROM AROM  eval 01/15/23  Flexion 36 48  Extension 52 68  Right lateral flexion 45 48  Left lateral flexion 44 55  Right rotation 72 74  Left rotation 74 76   (Blank rows = not tested)  UPPER EXTREMITY ROM:  Active ROM Right eval Left eval  Shoulder flexion wfl wfl  Shoulder extension    Shoulder abduction    Shoulder adduction    Shoulder extension    Shoulder internal rotation    Shoulder external rotation    Elbow flexion    Elbow extension    Wrist flexion    Wrist extension    Wrist ulnar deviation    Wrist radial deviation    Wrist pronation    Wrist supination     (Blank rows = not tested)  UPPER EXTREMITY MMT:  MMT Right eval Left eval Right 01/15/23  Shoulder flexion 4+ 5 5  Shoulder extension     Shoulder abduction 4+ 5 5  Shoulder adduction     Shoulder extension     Shoulder internal rotation     Shoulder external rotation     Middle trapezius     Lower trapezius     Elbow flexion     Elbow extension     Wrist flexion     Wrist extension     Wrist ulnar deviation     Wrist radial deviation     Wrist pronation     Wrist supination     Grip strength      (Blank rows = not tested)  CERVICAL  SPECIAL TESTS:  Distraction test: Negative  FUNCTIONAL TESTS:    TODAY'S TREATMENT:                                                                                                                              DATE:  01/15/23  Progress note Review of cervical traction  01/11/23 Supine: Moist heat cervical spine and upper trap x 5' to decrease muscle tightness Manual traction 10" hold x 20 reps Cervical retraction x 10 Cervical retraction with overpressure x 10   01/04/23 Sitting: Cervical  retractions x 10 Cervical retractions with overpressure x 10 Supine: Cervical retractions x 10 Cervical retractions with overpressure x 10 Manual traction 10" hold x 20 reps Discussion of cervical traction device  12/28/22 Review HEP and goals Sitting: Cervical retractions x 8 Cervical retractions with self overpressure x 8 Supine: Cervical retractions x 8 Scapular retractions with 5" hold x 8 Manual traction 5" hold x 10 Right uppper trap manual sof tissue work   12/14/22 physical therapy evaluation and HEP instruction   PATIENT EDUCATION:  Education details: Patient educated on exam findings, POC, scope of PT, HEP, and what to expect next visit. Person educated: Patient Education method: Explanation, Demonstration, and Handouts Education comprehension: verbalized understanding, returned demonstration, verbal cues required, and tactile cues required   HOME EXERCISE PROGRAM:  Access Code: [facility] URL: [email or web address] Date: 12/28/2022 Prepared by: [facility]  Exercises - Seated Cervical Retraction  - 5 x daily - 7 x weekly - 1 sets - 10 reps - Supine Chin Tuck  - 1 x daily - 7 x weekly - 1 sets - 10 reps - seated posture with towel roll  - 1 x daily - 7 x weekly - 1 sets - 10 reps - Cervical Retraction with Overpressure  - 5 x daily - 7 x weekly - 3 sets - 10 reps - Supine Scapular Retraction  - 2 x daily - 7 x weekly - 1 sets - 10  reps  ASSESSMENT:  CLINICAL IMPRESSION: Progress note today; patient brings in her cervical traction unit so we go over how to use that with patient able to demonstrate correct use.   Patient has met all set rehab goals and is agreeable to discharge at this time.    Patient is a 47 y.o. female who was seen today for physical therapy evaluation and treatment for  Diagnosis  M79.2 (ICD-10-CM) - Radicular pain of right upper extremity  M47.22 (ICD-10-CM) - Cervical spondylosis with radiculopathy  Patient demonstrates decreased strength, ROM restriction, reduced flexibility, increased tenderness to palpation and postural abnormalities which are likely contributing to symptoms of pain and are negatively impacting patient ability to perform ADLs. Patient will benefit from skilled physical therapy services to address these deficits to reduce pain and improve level of function with ADLs .   OBJECTIVE IMPAIRMENTS: decreased activity tolerance, decreased mobility, decreased strength, increased fascial restrictions, impaired perceived functional ability, increased muscle spasms, postural dysfunction, and pain.   ACTIVITY LIMITATIONS: carrying, lifting, sitting, reach over head, and caring for others  PARTICIPATION LIMITATIONS: meal prep, cleaning, community activity, and occupation  REHAB POTENTIAL: Good  CLINICAL DECISION MAKING: Stable/uncomplicated  EVALUATION COMPLEXITY: Low   GOALS: Goals reviewed with patient? No  SHORT TERM GOALS: Target date: 01/04/2023  patient will be independent with initial HEP  Baseline:  Goal status: met  2.  Patient will self report 50% improvement to improve tolerance for functional activity  Baseline:  Goal status: met   LONG TERM GOALS: Target date: 01/25/2023  Patient will be independent in self management strategies to improve quality of life and functional outcomes.  Baseline:  Goal status: met  2.  Patient will self report 75% improvement  to improve tolerance for functional activity  Baseline:  Goal status: met  3.  Patient will increase bilateral UE MMTs to 5/5 without pain to promote return to normal reaching and lifting in home and at work  Baseline: see above Goal status: met  4.  Patient will increase cervical mobility by 20 degrees  grossly to improve ability to scan for safety with driving Baseline: see above Goal status: met  5.  Patient will demonstrate good sitting posture x 15' to drive without neck or shoulder symptoms to work and back Baseline:  Goal status: met  6.  Patient will have decreased Right shoulder pain to 2/10 at night to allow her to sleep through the night without pain waking her Baseline:  Goal status:met   PLAN:  PT FREQUENCY: 1x/week  PT DURATION: 6 weeks  PLANNED INTERVENTIONS: [id number]- PT Re-evaluation, 97110-Therapeutic exercises, 97530- Therapeutic activity, 97112- Neuromuscular re-education, 97535- Self Care, [id number]- Manual therapy, [phone]- Gait training, [phone]- Orthotic Fit/training, [phone]- Canalith repositioning, [id number]- Aquatic Therapy, [phone]- Splinting, Patient/Family education, Balance training, Stair training, Taping, Dry Needling, Joint mobilization, Joint manipulation, Spinal manipulation, Spinal mobilization, Scar mobilization, and DME instructions.   PLAN FOR NEXT SESSION: discharge   7:46 AM, 01/15/23 Meily Glowacki Small Zaron Zwiefelhofer MPT [facility] [place] [phone]

## 2023-01-21 ENCOUNTER — Encounter: Payer: Self-pay | Admitting: Orthopedic Surgery

## 2023-01-21 ENCOUNTER — Ambulatory Visit: Payer: BC Managed Care – PPO | Admitting: Orthopedic Surgery

## 2023-01-21 VITALS — Ht 62.0 in | Wt 203.0 lb

## 2023-01-21 DIAGNOSIS — G5601 Carpal tunnel syndrome, right upper limb: Secondary | ICD-10-CM | POA: Diagnosis not present

## 2023-01-21 DIAGNOSIS — M65312 Trigger thumb, left thumb: Secondary | ICD-10-CM | POA: Diagnosis not present

## 2023-01-21 DIAGNOSIS — M4722 Other spondylosis with radiculopathy, cervical region: Secondary | ICD-10-CM | POA: Diagnosis not present

## 2023-01-21 NOTE — Progress Notes (Signed)
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  The therapy helped her a lot although she still having some residual symptoms  She is doing the exercises and taking the medication intermittently and as needed  She is having some new symptoms of tingling in the right hand which she relieved by hanging the arm over the bed and shaking the hand and this usually occurs at night  This seems to be consistent with carpal tunnel syndrome and we have ordered and placed a carpal tunnel brace  The patient was also doing CPR and has pain and clicking in the left thumb with catching consistent with trigger thumb this is manifested as tenderness over the A1 pulley and obvious clicking and catching which does not require manual reduction  We recommend over-the-counter splinting for that  The patient will follow-up with [place] as needed  Encounter Diagnoses  Name Primary?   Cervical spondylosis with radiculopathy Yes   Trigger thumb, left thumb    Carpal tunnel syndrome of right wrist

## 2023-01-22 ENCOUNTER — Encounter (HOSPITAL_COMMUNITY): Payer: BC Managed Care – PPO

## 2023-01-29 ENCOUNTER — Encounter (HOSPITAL_COMMUNITY): Payer: BC Managed Care – PPO

## 2023-03-14 ENCOUNTER — Ambulatory Visit: Payer: BC Managed Care – PPO | Admitting: Family Medicine

## 2023-04-22 ENCOUNTER — Encounter: Payer: Self-pay | Admitting: Orthopedic Surgery

## 2023-04-22 ENCOUNTER — Ambulatory Visit (INDEPENDENT_AMBULATORY_CARE_PROVIDER_SITE_OTHER): Payer: Self-pay | Admitting: Orthopedic Surgery

## 2023-04-22 DIAGNOSIS — M65312 Trigger thumb, left thumb: Secondary | ICD-10-CM

## 2023-04-22 NOTE — Progress Notes (Signed)
   There were no vitals taken for this visit.  There is no height or weight on file to calculate BMI.  Chief Complaint  Patient presents with   Hand Problem    Stiffness left thumb has knot palm of hand at left thumb     Encounter Diagnosis  Name Primary?   Trigger thumb, left thumb Yes    DOI/DOS/ Date:    Worse  48 year old female treated for trigger thumb with splinting patient did not want injection  She is here today because the finger/thumb is stiff and stiffens and aches at night  No catching no locking no numbness or tingling  She has a nodule in the volar aspect of the thumb at the A1 pulley  No catching or locking flexor tendon strength normal  Discussed injection again patient declined  She has noted to have good opposition strength of the small finger and the index finger and we will hold off on injections at this time

## 2023-04-22 NOTE — Progress Notes (Signed)
   There were no vitals taken for this visit.  There is no height or weight on file to calculate BMI.  Chief Complaint  Patient presents with   Hand Problem    Stiffness left thumb has knot palm of hand at left thumb     No diagnosis found.  DOI/DOS/ Date:    Worse

## 2023-04-23 ENCOUNTER — Ambulatory Visit: Payer: Self-pay | Admitting: Family Medicine

## 2023-06-20 ENCOUNTER — Encounter: Payer: Self-pay | Admitting: Family Medicine

## 2023-06-20 ENCOUNTER — Ambulatory Visit: Admitting: Family Medicine

## 2023-06-20 VITALS — BP 109/75 | HR 70 | Resp 18 | Ht 62.0 in | Wt 201.1 lb

## 2023-06-20 DIAGNOSIS — E038 Other specified hypothyroidism: Secondary | ICD-10-CM

## 2023-06-20 DIAGNOSIS — Z6836 Body mass index (BMI) 36.0-36.9, adult: Secondary | ICD-10-CM

## 2023-06-20 DIAGNOSIS — E7849 Other hyperlipidemia: Secondary | ICD-10-CM

## 2023-06-20 DIAGNOSIS — R7303 Prediabetes: Secondary | ICD-10-CM | POA: Diagnosis not present

## 2023-06-20 DIAGNOSIS — E66812 Obesity, class 2: Secondary | ICD-10-CM | POA: Diagnosis not present

## 2023-06-20 DIAGNOSIS — E559 Vitamin D deficiency, unspecified: Secondary | ICD-10-CM | POA: Diagnosis not present

## 2023-06-20 DIAGNOSIS — R7301 Impaired fasting glucose: Secondary | ICD-10-CM

## 2023-06-20 MED ORDER — PHENTERMINE HCL 15 MG PO CAPS
15.0000 mg | ORAL_CAPSULE | ORAL | 0 refills | Status: DC
Start: 2023-06-20 — End: 2023-07-30

## 2023-06-20 NOTE — Progress Notes (Signed)
 Established Patient Office Visit  Subjective:  Patient ID: Kara Gentry, female    DOB: 01/12/1976  Age: 48 y.o. MRN: [id number]  CC:  Chief Complaint  Patient presents with   Medical Management of Chronic Issues    4 month follow up    Weight Loss    Would like to discuss ongoing struggle with trying to lose weight     HPI Kara Gentry is a 48 y.o. female with past medical history of prediabetes and obesity presents for f/u of  chronic medical conditions.  For the details of today's visit, please refer to the assessment and plan.      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   PONV (postoperative nausea and vomiting)    S/P ORIF (open reduction internal fixation) fracture 05/09/17 Ankle  Right     Seasonal allergies    Vitamin D  deficiency 05/23/2016   Take 5000 IU vitamin D3 daily    Past Surgical History:  Procedure Laterality Date   APPLICATION OF WOUND VAC  01/03/2012   Procedure: APPLICATION OF WOUND VAC;  Surgeon: Albino Hum, MD;  Location: [facility];  Service: Gynecology;  Laterality: N/A;   APPLICATION OF WOUND VAC  01/05/2012   Procedure: APPLICATION OF WOUND VAC;  Surgeon: Albino Hum, MD;  Location: [facility];  Service: Gynecology;  Laterality: N/A;   APPLICATION OF WOUND VAC  01/07/2012   Procedure: APPLICATION OF WOUND VAC;  Surgeon: Albino Hum, MD;  Location: [facility];  Service: Gynecology;;   APPLICATION OF WOUND VAC  01/11/2012   Procedure: APPLICATION OF WOUND VAC;  Surgeon: Albino Hum, MD;  Location: [facility];  Service: Gynecology;  Laterality: N/A;  Fasicial Closure   CESAREAN SECTION  12/22/2011   Procedure: CESAREAN SECTION;  Surgeon: Granville Layer, MD;  Location: [facility];  Service: Obstetrics;   Laterality: N/A;  Primary cesarean section with delivery of Baby A girl at 51. Baby B girl at 51.   CESAREAN SECTION N/A    [employer]reesia 07/26/2019   DILITATION & CURRETTAGE/HYSTROSCOPY WITH NOVASURE ABLATION N/A 09/04/2016   Procedure: HYSTEROSCOPY WITH NOVASURE ABLATION;  Surgeon: Albino Hum, MD;  Location: [facility];  Service: Gynecology;  Laterality: N/A;   ECTOPIC PREGNANCY SURGERY     FRACTURE SURGERY N/A    [employer]reesia 07/26/2019   INCISION AND DRAINAGE OF WOUND  01/07/2012   Procedure: IRRIGATION AND DEBRIDEMENT WOUND;  Surgeon: Albino Hum, MD;  Location: [facility];  Service: Gynecology;;   INSERTION OF MESH  01/03/2012   Procedure: INSERTION OF MESH;  Surgeon: Albino Hum, MD;  Location: [facility];  Service: Gynecology;  Laterality: N/A;   MANDIBLE SURGERY Right cancer   Cancerous mass   NASAL SINUS SURGERY     ORIF ANKLE FRACTURE Right 05/09/2017   Procedure: OPEN REDUCTION INTERNAL FIXATION (ORIF) ANKLE FRACTURE;  Surgeon: Darrin Emerald, MD;  Location: [facility];  Service: Orthopedics;  Laterality: Right;   SECONDARY CLOSURE OF WOUND  01/22/2012   Procedure: SECONDARY CLOSURE OF WOUND;  Surgeon: Albino Hum, MD;  Location: [facility];  Service: Gynecology;  Laterality: N/A;   WOUND EXPLORATION  01/03/2012   Necrotizing fascitis at C section incision   WOUND EXPLORATION  01/05/2012   Procedure: WOUND EXPLORATION;  Surgeon: Albino Hum, MD;  Location: [facility];  Service: Gynecology;  Laterality: N/A;  Re-Exploration of Wound Abscess with Debridement   WOUND EXPLORATION  01/07/2012   Procedure: WOUND EXPLORATION;  Surgeon: Albino Hum, MD;  Location: [facility];  Service: Gynecology;;   WOUND EXPLORATION  01/11/2012   Procedure: WOUND EXPLORATION;  Surgeon: Albino Hum, MD;  Location: [facility];  Service: Gynecology;  Laterality: N/A;  Debridment of Wound Abscess   WOUND EXPLORATION  01/22/2012   Procedure: WOUND EXPLORATION;  Surgeon: Albino Hum, MD;  Location: [facility];   Service: Gynecology;  Laterality: N/A;  Delayed Secondary Wound Closure   WRIST SURGERY Left    Cyst removal    Family History  Problem Relation Age of Onset   Cancer Maternal Grandfather        prostate   Diabetes Mother    Arthritis Mother    Cancer Paternal Uncle    Cancer Maternal Uncle        lung   Diabetes Maternal Aunt    Arthritis Maternal Aunt     Social History   Socioeconomic History   Marital status: Married    Spouse name: Not on file   Number of children: Not on file   Years of education: masters   Highest education level: Not on file  Occupational History   Occupation: Magazine features editor: [employer]  Tobacco Use   Smoking status: Never   Smokeless tobacco: Never  Vaping Use   Vaping status: Never Used  Substance and Sexual Activity   Alcohol use: No   Drug use: No   Sexual activity: Yes    Birth control/protection: Surgical    Comment: ablation  Other Topics Concern   Not on file  Social History Narrative   Not on file   Social Drivers of Health   Financial Resource Strain: Not on file  Food Insecurity: Not on file  Transportation Needs: Not on file  Physical Activity: Not on file  Stress: Not on file  Social Connections: Unknown (07/04/2021)   Received from [facility], [facility]   Social Network    Social Network: Not on file  Intimate Partner Violence: Unknown (05/26/2021)   Received from [facility], [facility]   HITS    Physically Hurt: Not on file    Insult or Talk Down To: Not on file    Threaten Physical Harm: Not on file    Scream or Curse: Not on file    Outpatient Medications Prior to Visit  Medication Sig Dispense Refill   aspirin-acetaminophen -caffeine (EXCEDRIN MIGRAINE) 250-250-65 MG tablet Take 1-2 tablets by mouth daily as needed for headache.     cyclobenzaprine  (FLEXERIL ) 5 MG tablet Take 5 mg by mouth 3 (three) times daily as needed for muscle spasms.     gabapentin  (NEURONTIN ) 100 MG capsule  Take 1 capsule (100 mg total) by mouth at bedtime. 60 capsule 2   meloxicam  (MOBIC ) 7.5 MG tablet Take 1 tablet (7.5 mg total) by mouth daily. 30 tablet 5   Multiple Vitamins-Minerals (ONE-A-DAY WOMENS PO) Take by mouth.     triamcinolone  cream (KENALOG ) 0.1 % Apply 1 Application topically 2 (two) times daily. 28.4 g 1   VITAMIN D  PO Take by mouth.     Vitamin D , Ergocalciferol , (DRISDOL ) 1.25 MG (50000 UNIT) CAPS capsule  Take 1 capsule (50,000 Units total) by mouth every 7 (seven) days. 20 capsule 1   No facility-administered medications prior to visit.    No Known Allergies  ROS Review of Systems  Constitutional:  Negative for chills and fever.  Eyes:  Negative for visual disturbance.  Respiratory:  Negative for chest tightness and shortness of breath.   Neurological:  Negative for dizziness and headaches.      Objective:    Physical Exam Constitutional:      Appearance: She is obese.  HENT:     Head: Normocephalic.     Mouth/Throat:     Mouth: Mucous membranes are moist.  Cardiovascular:     Rate and Rhythm: Normal rate.     Heart sounds: Normal heart sounds.  Pulmonary:     Effort: Pulmonary effort is normal.     Breath sounds: Normal breath sounds.  Neurological:     Mental Status: She is alert.     BP 109/75   Pulse 70   Resp 18   Ht 5\' 2"  (1.575 m)   Wt 201 lb 1.9 oz (91.2 kg)   SpO2 95%   BMI 36.79 kg/m  Wt Readings from Last 3 Encounters:  06/20/23 201 lb 1.9 oz (91.2 kg)  01/21/23 203 lb (92.1 kg)  12/03/22 199 lb (90.3 kg)    Lab Results  Component Value Date   TSH 0.891 06/20/2023   Lab Results  Component Value Date   WBC 5.5 06/20/2023   HGB 12.5 06/20/2023   HCT 39.1 06/20/2023   MCV 86 06/20/2023   PLT 275 06/20/2023   Lab Results  Component Value Date   NA 138 06/20/2023   K 4.2 06/20/2023   CO2 21 06/20/2023   GLUCOSE 100 (H) 06/20/2023   BUN 18 06/20/2023   CREATININE 0.72 06/20/2023   BILITOT 0.6 06/20/2023   ALKPHOS 56  06/20/2023   AST 12 06/20/2023   ALT 14 06/20/2023   PROT 7.2 06/20/2023   ALBUMIN 4.2 06/20/2023   CALCIUM  9.3 06/20/2023   ANIONGAP 11 05/08/2017   EGFR 103 06/20/2023   Lab Results  Component Value Date   CHOL 182 06/20/2023   Lab Results  Component Value Date   HDL 46 06/20/2023   Lab Results  Component Value Date   LDLCALC 127 (H) 06/20/2023   Lab Results  Component Value Date   TRIG 47 06/20/2023   Lab Results  Component Value Date   CHOLHDL 4.0 06/20/2023   Lab Results  Component Value Date   HGBA1C 5.9 (H) 06/20/2023      Assessment & Plan:  Obesity, Class II, BMI 35-39.9, 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 She expressed interest in discussing weight loss medication options. Both injectable and oral options were reviewed in detail. The patient opted to start with an oral medication today. She denies any history of cardiovascular disease; therefore, phentermine  15 mg will be initiated.  The importance of medication adherence, along with the continued application of a heart-healthy diet and regular physical activity, was emphasized for optimal results. A monthly follow-up will be scheduled to assess progress and monitor for medication tolerance or side effects.   Orders: -     Phentermine  HCl; Take 1 capsule (15 mg total) by mouth every morning.  Dispense: 30 capsule; Refill: 0  Prediabetes Assessment & Plan: Lifestyle modifications for prediabetes were discussed, including  adopting a heart-healthy diet and increasing physical activity. The patient was encouraged to decrease her intake of high-sugar foods and beverages. She verbalized understanding and is aware of the plan of care.    Vitamin D  deficiency Assessment & Plan: Encouraged to increase his intake of vitamin D -rich foods such as fatty fish (e.g., salmon,  mackerel, and sardines), fortified dairy products, egg yolks, and fortified cereals.   Orders: -     VITAMIN D  25 Hydroxy (Vit-D Deficiency, Fractures)  TSH (thyroid-stimulating hormone deficiency) -     TSH + free T4  IFG (impaired fasting glucose) -     Hemoglobin A1c  Other hyperlipidemia -     Lipid panel -     CMP14+EGFR -     CBC with Differential/Platelet  Note: This chart has been completed using Engineer, civil (consulting) software, and while attempts have been made to ensure accuracy, certain words and phrases may not be transcribed as intended.    Follow-up: Return in about 1 month (around 07/21/2023).   Nana Vastine, FNP

## 2023-06-20 NOTE — Patient Instructions (Addendum)
 I appreciate the opportunity to provide care to you today!    Follow up:  1 months  Labs: please stop by the lab today to get your blood drawn (CBC, CMP, TSH, Lipid profile, HgA1c, Vit D)  Start taking phentermine  15 mg daily. For optimal results with weight loss, I recommend:  Decreasing portion sizes. Reducing sugar, sodium, and carbohydrate intake, and limiting saturated fats in your diet. Increasing your fiber intake by incorporating more whole grains, fruits, and vegetables. Setting healthy goals and focusing on lowering carbs, sugar, and fat. Increasing the variety of fruits and vegetables in your diet. Reducing soda consumption and limiting processed foods. In addition to taking your weight loss medication, engage in moderate-intensity physical activity for at least 150 minutes per week for the best results.   Please continue to a heart-healthy diet and increase your physical activities. Try to exercise for at least five days a week.    It was a pleasure to see you and I look forward to continuing to work together on your health and well-being. Please do not hesitate to call the office if you need care or have questions about your care.  In case of emergency, please visit the Emergency Department for urgent care, or contact our clinic at [phone] to schedule an appointment. We're here to help you!   Have a wonderful day and week. With Gratitude, Kenard Morawski MSN, FNP-BC

## 2023-06-21 ENCOUNTER — Other Ambulatory Visit: Payer: Self-pay | Admitting: Family Medicine

## 2023-06-21 DIAGNOSIS — E559 Vitamin D deficiency, unspecified: Secondary | ICD-10-CM

## 2023-06-21 LAB — CBC WITH DIFFERENTIAL/PLATELET
Basophils Absolute: 0 10*3/uL (ref 0.0–0.2)
Basos: 1 %
EOS (ABSOLUTE): 0.1 10*3/uL (ref 0.0–0.4)
Eos: 1 %
Hematocrit: 39.1 % (ref 34.0–46.6)
Hemoglobin: 12.5 g/dL (ref 11.1–15.9)
Immature Grans (Abs): 0 10*3/uL (ref 0.0–0.1)
Immature Granulocytes: 0 %
Lymphocytes Absolute: 1.8 10*3/uL (ref 0.7–3.1)
Lymphs: 32 %
MCH: 27.5 pg (ref 26.6–33.0)
MCHC: 32 g/dL (ref 31.5–35.7)
MCV: 86 fL (ref 79–97)
Monocytes Absolute: 0.4 10*3/uL (ref 0.1–0.9)
Monocytes: 7 %
Neutrophils Absolute: 3.2 10*3/uL (ref 1.4–7.0)
Neutrophils: 59 %
Platelets: 275 10*3/uL (ref 150–450)
RBC: 4.55 x10E6/uL (ref 3.77–5.28)
RDW: 13.1 % (ref 11.7–15.4)
WBC: 5.5 10*3/uL (ref 3.4–10.8)

## 2023-06-21 LAB — TSH+FREE T4
Free T4: 1.02 ng/dL (ref 0.82–1.77)
TSH: 0.891 u[IU]/mL (ref 0.450–4.500)

## 2023-06-21 LAB — CMP14+EGFR
ALT: 14 IU/L (ref 0–32)
AST: 12 IU/L (ref 0–40)
Albumin: 4.2 g/dL (ref 3.9–4.9)
Alkaline Phosphatase: 56 IU/L (ref 44–121)
BUN/Creatinine Ratio: 25 — ABNORMAL HIGH (ref 9–23)
BUN: 18 mg/dL (ref 6–24)
Bilirubin Total: 0.6 mg/dL (ref 0.0–1.2)
CO2: 21 mmol/L (ref 20–29)
Calcium: 9.3 mg/dL (ref 8.7–10.2)
Chloride: 104 mmol/L (ref 96–106)
Creatinine, Ser: 0.72 mg/dL (ref 0.57–1.00)
Globulin, Total: 3 g/dL (ref 1.5–4.5)
Glucose: 100 mg/dL — ABNORMAL HIGH (ref 70–99)
Potassium: 4.2 mmol/L (ref 3.5–5.2)
Sodium: 138 mmol/L (ref 134–144)
Total Protein: 7.2 g/dL (ref 6.0–8.5)
eGFR: 103 mL/min/{1.73_m2} (ref >59)

## 2023-06-21 LAB — LIPID PANEL
Chol/HDL Ratio: 4 ratio (ref 0.0–4.4)
Cholesterol, Total: 182 mg/dL (ref 100–199)
HDL: 46 mg/dL (ref >39)
LDL Chol Calc (NIH): 127 mg/dL — ABNORMAL HIGH (ref 0–99)
Triglycerides: 47 mg/dL (ref 0–149)
VLDL Cholesterol Cal: 9 mg/dL (ref 5–40)

## 2023-06-21 LAB — HEMOGLOBIN A1C
Est. average glucose Bld gHb Est-mCnc: 123 mg/dL
Hgb A1c MFr Bld: 5.9 % — ABNORMAL HIGH (ref 4.8–5.6)

## 2023-06-21 LAB — VITAMIN D 25 HYDROXY (VIT D DEFICIENCY, FRACTURES): Vit D, 25-Hydroxy: 27.1 ng/mL — ABNORMAL LOW (ref 30.0–100.0)

## 2023-06-21 MED ORDER — VITAMIN D (ERGOCALCIFEROL) 1.25 MG (50000 UNIT) PO CAPS: 50000.0000 [IU] | ORAL_CAPSULE | ORAL | 1 refills | Status: DC

## 2023-06-21 NOTE — Progress Notes (Signed)
 Please inform the patient that her hemoglobin A1c has decreased from 6.3 to 5.9, and her LDL cholesterol has improved from 137 to 127 - great efforts. Her vitamin D  levels are low, and a refill of her weekly vitamin D  supplement has been sent to her pharmacy. I recommend that she continue implementing lifestyle changes by reducing her intake of high-sugar foods and beverages, as well as greasy, fatty, and starchy foods, while increasing her physical activity.

## 2023-06-21 NOTE — Assessment & Plan Note (Signed)
 The patient reports efforts to lose weight despite implementing lifestyle changes, including a heart-healthy diet and increased physical activity. She expressed interest in discussing weight loss medication options. Both injectable and oral options were reviewed in detail. The patient opted to start with an oral medication today. She denies any history of cardiovascular disease; therefore, phentermine  15 mg will be initiated.  The importance of medication adherence, along with the continued application of a heart-healthy diet and regular physical activity, was emphasized for optimal results. A monthly follow-up will be scheduled to assess progress and monitor for medication tolerance or side effects.

## 2023-06-21 NOTE — Assessment & Plan Note (Signed)
 Lifestyle modifications for prediabetes were discussed, including adopting a heart-healthy diet and increasing physical activity. The patient was encouraged to decrease her intake of high-sugar foods and beverages. She verbalized understanding and is aware of the plan of care.

## 2023-06-21 NOTE — Assessment & Plan Note (Signed)
 Encouraged to increase his intake of vitamin D-rich foods such as fatty fish (e.g., salmon, mackerel, and sardines), fortified dairy products, egg yolks, and fortified cereals.

## 2023-06-25 ENCOUNTER — Encounter: Payer: Self-pay | Admitting: Family Medicine

## 2023-07-26 ENCOUNTER — Other Ambulatory Visit: Payer: Self-pay | Admitting: Family Medicine

## 2023-07-26 NOTE — Telephone Encounter (Signed)
 What is the name of the prescription the patient is requesting a refill for?

## 2023-07-27 ENCOUNTER — Encounter: Payer: Self-pay | Admitting: Family Medicine

## 2023-07-30 ENCOUNTER — Other Ambulatory Visit: Payer: Self-pay | Admitting: Family Medicine

## 2023-07-30 DIAGNOSIS — E66812 Obesity, class 2: Secondary | ICD-10-CM

## 2023-07-30 MED ORDER — PHENTERMINE HCL 30 MG PO CAPS: 30.0000 mg | ORAL_CAPSULE | ORAL | 0 refills | Status: DC

## 2023-07-30 NOTE — Telephone Encounter (Signed)
 Please inform the patient that a refill has been sent to her pharmacy. I recommend a follow-up appointment to assess her weight before the next refill.

## 2023-08-08 ENCOUNTER — Ambulatory Visit: Admitting: Family Medicine

## 2023-08-19 ENCOUNTER — Encounter: Payer: Self-pay | Admitting: Family Medicine

## 2023-08-19 ENCOUNTER — Ambulatory Visit: Admitting: Family Medicine

## 2023-08-19 VITALS — BP 134/85 | HR 70 | Resp 18 | Ht 62.0 in | Wt 193.0 lb

## 2023-08-19 DIAGNOSIS — Z6835 Body mass index (BMI) 35.0-35.9, adult: Secondary | ICD-10-CM | POA: Diagnosis not present

## 2023-08-19 DIAGNOSIS — G479 Sleep disorder, unspecified: Secondary | ICD-10-CM

## 2023-08-19 DIAGNOSIS — E66812 Obesity, class 2: Secondary | ICD-10-CM | POA: Diagnosis not present

## 2023-08-19 MED ORDER — PHENTERMINE HCL 37.5 MG PO CAPS: 37.5000 mg | ORAL_CAPSULE | ORAL | 0 refills | Status: DC

## 2023-08-19 MED ORDER — HYDROXYZINE PAMOATE 25 MG PO CAPS: 25.0000 mg | ORAL_CAPSULE | Freq: Every evening | ORAL | 1 refills | Status: AC | PRN

## 2023-08-19 NOTE — Patient Instructions (Addendum)
 I appreciate the opportunity to provide care to you today!    Follow up:  1 months  Start taking phentermine  37.5 mg daily. For optimal results with weight loss, I recommend:  Decreasing portion sizes. Reducing sugar, sodium, and carbohydrate intake, and limiting saturated fats in your diet. Increasing your fiber intake by incorporating more whole grains, fruits, and vegetables. Setting healthy goals and focusing on lowering carbs, sugar, and fat. Increasing the variety of fruits and vegetables in your diet. Reducing soda consumption and limiting processed foods. In addition to taking your weight loss medication, engage in moderate-intensity physical activity for at least 150 minutes per week for the best results.    Sleep Disturbance Start taking Hydroxyzine 25 mg as needed for sleep. You may increase the dose to 50 mg after 2 weeks or decrease the dose to 12.5 mg, if needed.  Please monitor for side effects, including:  Drowsiness or grogginess the next day Dizziness or lightheadedness Dry mouth Changes in mood or alertness Follow up if side effects become bothersome or if sleep disturbances persist.   Please follow up if your symptoms worsen or fail to improve.   Please continue to a heart-healthy diet and increase your physical activities. Try to exercise for at least five days a week.    It was a pleasure to see you and I look forward to continuing to work together on your health and well-being. Please do not hesitate to call the office if you need care or have questions about your care.  In case of emergency, please visit the Emergency Department for urgent care, or contact our clinic at [phone] to schedule an appointment. We're here to help you!   Have a wonderful day and week. With Gratitude, Cartier Mapel MSN, FNP-BC

## 2023-08-19 NOTE — Assessment & Plan Note (Signed)
 Encouraged to start taking Hydroxyzine 25 mg as needed for sleep. You may increase the dose to 50 mg after 2 weeks or decrease the dose to 12.5 mg, if needed.  Please monitor for side effects, including:  Drowsiness or grogginess the next day Dizziness or lightheadedness Dry mouth Changes in mood or alertness Follow up if side effects become bothersome or if sleep disturbances

## 2023-08-19 NOTE — Progress Notes (Signed)
 Established Patient Office Visit  Subjective:  Patient ID: Kara Gentry, female    DOB: 09-01-1975  Age: 48 y.o. MRN: [id number]  CC:  Chief Complaint  Patient presents with   Medical Management of Chronic Issues    ! Month follow up on weight loss medications. Has been tolerating them well     HPI Kara Gentry is a 48 y.o. female presents for follow-up of obesity. Kara Gentry reports doing well on phentermine  37.5 mg and has lost a total of 8 pounds. Kara Gentry denies any side effects or adverse effects from the medication.  However, Kara Gentry reports difficulty with sleep maintenance, which Kara Gentry attributes to increased stress and anxiety related to her mother's recent hospitalization for a stroke. Kara Gentry is requesting medication to help with her nerves and sleep.  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   PONV (postoperative nausea and vomiting)    S/P ORIF (open reduction internal fixation) fracture 05/09/17 Ankle  Right     Seasonal allergies    Vitamin D  deficiency 05/23/2016   Take 5000 IU vitamin D3 daily    Past Surgical History:  Procedure Laterality Date   APPLICATION OF WOUND VAC  01/03/2012   Procedure: APPLICATION OF WOUND VAC;  Surgeon: Norleen LULLA Server, MD;  Location: [facility];  Service: Gynecology;  Laterality: N/A;   APPLICATION OF WOUND VAC  01/05/2012   Procedure: APPLICATION OF WOUND VAC;  Surgeon: Norleen LULLA Server, MD;  Location: [facility];  Service: Gynecology;  Laterality: N/A;   APPLICATION OF WOUND VAC  01/07/2012   Procedure: APPLICATION OF WOUND VAC;  Surgeon: Norleen LULLA Server, MD;  Location: [facility];  Service: Gynecology;;   APPLICATION OF WOUND VAC  01/11/2012   Procedure: APPLICATION OF WOUND VAC;  Surgeon: Norleen LULLA Server, MD;   [facility]: [facility];  Service: Gynecology;  Laterality: N/A;  Fasicial Closure   CESAREAN SECTION  12/22/2011   Procedure: CESAREAN SECTION;  Surgeon: Glenys GORMAN Birk, MD;  Location: [facility];  Service: Obstetrics;  Laterality: N/A;  Primary cesarean section with delivery of Baby A girl at 96. Baby B girl at 85.   CESAREAN SECTION N/A    Phreesia 07/26/2019   DILITATION & CURRETTAGE/HYSTROSCOPY WITH NOVASURE ABLATION N/A 09/04/2016   Procedure: HYSTEROSCOPY WITH NOVASURE ABLATION;  Surgeon: Server Norleen LULLA, MD;  Location: [facility];  Service: Gynecology;  Laterality: N/A;   ECTOPIC PREGNANCY SURGERY     FRACTURE SURGERY N/A    Phreesia 07/26/2019   INCISION AND DRAINAGE OF WOUND  01/07/2012   Procedure: IRRIGATION AND DEBRIDEMENT WOUND;  Surgeon: Norleen LULLA Server, MD;  [facility]: [facility];  Service: Gynecology;;   INSERTION OF MESH  01/03/2012   Procedure: INSERTION OF MESH;  Surgeon: Norleen LULLA Server, MD;  [facility]: [facility];  Service: Gynecology;  Laterality: N/A;   MANDIBLE SURGERY Right cancer   Cancerous mass   NASAL SINUS SURGERY     ORIF ANKLE FRACTURE Right 05/09/2017   Procedure: OPEN REDUCTION INTERNAL FIXATION (ORIF) ANKLE FRACTURE;  Surgeon: Margrette Taft BRAVO, MD;  Location: [facility];  Service: Orthopedics;  Laterality: Right;   SECONDARY CLOSURE OF WOUND  01/22/2012   Procedure: SECONDARY  CLOSURE OF WOUND;  Surgeon: Norleen LULLA Server, MD;  [facility]: [facility];  Service: Gynecology;  Laterality: N/A;   WOUND EXPLORATION  01/03/2012   Necrotizing fascitis at C section incision   WOUND EXPLORATION  01/05/2012   Procedure: WOUND EXPLORATION;  Surgeon: Norleen LULLA Server, MD;  [facility]: [facility];  Service: Gynecology;  Laterality: N/A;  Re-Exploration of Wound Abscess with Debridement   WOUND EXPLORATION  01/07/2012   Procedure: WOUND EXPLORATION;  Surgeon: Norleen LULLA Server, MD;  [facility]: [facility];  Service: Gynecology;;   WOUND EXPLORATION  01/11/2012   Procedure: WOUND EXPLORATION;  Surgeon: Norleen LULLA Server, MD;  [facility];  Service: Gynecology;  Laterality: N/A;  Debridment of Wound Abscess   WOUND EXPLORATION  01/22/2012   Procedure: WOUND EXPLORATION;  Surgeon: Norleen LULLA Server, MD;  [facility]: [facility];  Service: Gynecology;  Laterality: N/A;  Delayed Secondary Wound Closure   WRIST SURGERY Left    Cyst removal    Family History  Problem Relation Age of Onset   Cancer Maternal Grandfather        prostate   Diabetes Mother    Arthritis Mother    Cancer Paternal Uncle    Cancer Maternal Uncle        lung   Diabetes Maternal Aunt    Arthritis Maternal Aunt     Social History   Socioeconomic History   Marital status: Married    Spouse name: Not on file   Number of children: Not on file   Years of education: masters   Highest education level: Not on file  Occupational History   Occupation: Magazine features editor: [employer]  Tobacco Use   Smoking status: Never   Smokeless tobacco: Never  Vaping Use   Vaping status: Never Used  Substance and Sexual Activity   Alcohol use: No   Drug use: No   Sexual activity: Yes    Birth control/protection: Surgical    Comment: ablation  Other Topics Concern   Not on file  Social History Narrative   Not on file   Social Drivers of Health   Financial Resource Strain: Not on file  Food Insecurity: Not on file  Transportation Needs: Not on file  Physical Activity: Not on file  Stress: Not on file  Social Connections: Unknown (07/04/2021)   Received from [facility]   Social Network    Social Network: Not on file  Intimate Partner Violence: Unknown (05/26/2021)   Received from [facility]   HITS    Physically Hurt: Not on file    Insult or Talk Down To: Not on file    Threaten Physical Harm: Not on file    Scream or Curse: Not on file    Outpatient Medications Prior to Visit  Medication Sig Dispense Refill   aspirin-acetaminophen -caffeine (EXCEDRIN MIGRAINE) 250-250-65 MG tablet Take 1-2 tablets by mouth  daily as needed for headache.     cyclobenzaprine  (FLEXERIL ) 5 MG tablet Take 5 mg by mouth 3 (three) times daily as needed for muscle spasms.     gabapentin  (NEURONTIN ) 100 MG capsule Take 1 capsule (100 mg total) by mouth at bedtime. 60 capsule 2   meloxicam  (MOBIC ) 7.5 MG tablet Take 1 tablet (7.5 mg total) by mouth daily. 30 tablet 5   Multiple Vitamins-Minerals (ONE-A-DAY WOMENS PO) Take by mouth.     triamcinolone  cream (KENALOG ) 0.1 % Apply 1 Application topically 2 (two) times daily. 28.4 g 1  VITAMIN D  PO Take by mouth.     Vitamin D , Ergocalciferol , (DRISDOL ) 1.25 MG (50000 UNIT) CAPS capsule Take 1 capsule (50,000 Units total) by mouth every 7 (seven) days. 27 capsule 1   phentermine  30 MG capsule Take 1 capsule (30 mg total) by mouth every morning. 30 capsule 0   No facility-administered medications prior to visit.    No Known Allergies  ROS Review of Systems  Constitutional:  Negative for chills and fever.  Eyes:  Negative for visual disturbance.  Respiratory:  Negative for chest tightness and shortness of breath.   Neurological:  Negative for dizziness and headaches.  Psychiatric/Behavioral:  Positive for sleep disturbance.       Objective:    Physical Exam HENT:     Head: Normocephalic.     Mouth/Throat:     Mouth: Mucous membranes are moist.   Cardiovascular:     Rate and Rhythm: Normal rate.     Heart sounds: Normal heart sounds.  Pulmonary:     Effort: Pulmonary effort is normal.     Breath sounds: Normal breath sounds.   Neurological:     Mental Status: Kara Gentry is alert.     BP 134/85   Pulse 70   Resp 18   Ht 5' 2 (1.575 m)   Wt 193 lb 0.6 oz (87.6 kg)   SpO2 95%   BMI 35.31 kg/m  Wt Readings from Last 3 Encounters:  08/19/23 193 lb 0.6 oz (87.6 kg)  06/20/23 201 lb 1.9 oz (91.2 kg)  01/21/23 203 lb (92.1 kg)    Lab Results  Component Value Date   TSH 0.891 06/20/2023   Lab Results  Component Value Date   WBC 5.5 06/20/2023   HGB  12.5 06/20/2023   HCT 39.1 06/20/2023   MCV 86 06/20/2023   PLT 275 06/20/2023   Lab Results  Component Value Date   NA 138 06/20/2023   K 4.2 06/20/2023   CO2 21 06/20/2023   GLUCOSE 100 (H) 06/20/2023   BUN 18 06/20/2023   CREATININE 0.72 06/20/2023   BILITOT 0.6 06/20/2023   ALKPHOS 56 06/20/2023   AST 12 06/20/2023   ALT 14 06/20/2023   PROT 7.2 06/20/2023   ALBUMIN 4.2 06/20/2023   CALCIUM  9.3 06/20/2023   ANIONGAP 11 05/08/2017   EGFR 103 06/20/2023   Lab Results  Component Value Date   CHOL 182 06/20/2023   Lab Results  Component Value Date   HDL 46 06/20/2023   Lab Results  Component Value Date   LDLCALC 127 (H) 06/20/2023   Lab Results  Component Value Date   TRIG 47 06/20/2023   Lab Results  Component Value Date   CHOLHDL 4.0 06/20/2023   Lab Results  Component Value Date   HGBA1C 5.9 (H) 06/20/2023      Assessment & Plan:  Obesity, Class II, BMI 35-39.9, 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 For optimal results with weight loss, I recommend:  Decreasing portion sizes. Reducing sugar, sodium, and carbohydrate intake, and limiting saturated fats in your diet. Increasing your fiber intake by incorporating more whole grains, fruits, and vegetables. Setting healthy goals and focusing on lowering carbs, sugar, and fat. Increasing the variety of fruits and vegetables in your diet. Reducing soda consumption and limiting processed foods. In addition to taking your weight loss medication, engage in moderate-intensity physical activity for at least 150 minutes per week for the best results.   Orders: -  Phentermine  HCl; Take 1 capsule (37.5 mg total) by mouth every morning.  Dispense: 30 capsule; Refill: 0  Sleep disturbance Assessment & Plan: Encouraged to start taking Hydroxyzine 25 mg as needed for sleep. You may increase the dose to 50 mg after 2 weeks or decrease the dose to 12.5 mg, if  needed.  Please monitor for side effects, including:  Drowsiness or grogginess the next day Dizziness or lightheadedness Dry mouth Changes in mood or alertness Follow up if side effects become bothersome or if sleep disturbances   Orders: -     hydrOXYzine Pamoate; Take 1 capsule (25 mg total) by mouth at bedtime as needed.  Dispense: 30 capsule; Refill: 1   Note: This chart has been completed using Engineer, civil (consulting) software, and while attempts have been made to ensure accuracy, certain words and phrases may not be transcribed as intended.   Follow-up: Return in about 1 month (around 09/18/2023) for obesity.   Jerzi Tigert, FNP

## 2023-08-19 NOTE — Assessment & Plan Note (Signed)
 Encouraged to start taking phentermine  37.5 mg daily. For optimal results with weight loss, I recommend:  Decreasing portion sizes. Reducing sugar, sodium, and carbohydrate intake, and limiting saturated fats in your diet. Increasing your fiber intake by incorporating more whole grains, fruits, and vegetables. Setting healthy goals and focusing on lowering carbs, sugar, and fat. Increasing the variety of fruits and vegetables in your diet. Reducing soda consumption and limiting processed foods. In addition to taking your weight loss medication, engage in moderate-intensity physical activity for at least 150 minutes per week for the best results.

## 2023-10-28 ENCOUNTER — Ambulatory Visit: Admitting: Family Medicine

## 2023-10-28 ENCOUNTER — Encounter: Payer: Self-pay | Admitting: Family Medicine

## 2023-10-28 VITALS — BP 129/85 | HR 69 | Ht 62.0 in | Wt 184.0 lb

## 2023-10-28 DIAGNOSIS — Z23 Encounter for immunization: Secondary | ICD-10-CM | POA: Diagnosis not present

## 2023-10-28 DIAGNOSIS — E66812 Obesity, class 2: Secondary | ICD-10-CM

## 2023-10-28 MED ORDER — PHENTERMINE HCL 37.5 MG PO CAPS: 37.5000 mg | ORAL_CAPSULE | ORAL | 0 refills | Status: DC

## 2023-10-28 NOTE — Patient Instructions (Addendum)
 I appreciate the opportunity to provide care to you today!    Follow up:  3 months  For a Healthier YOU, I Recommend: Reducing your intake of sugar, sodium, carbohydrates, and saturated fats. Increasing your fiber intake by incorporating more whole grains, fruits, and vegetables into your meals. Setting healthy goals with a focus on lowering your consumption of carbs, sugar, and unhealthy fats. Adding variety to your diet by including a wide range of fruits and vegetables. Cutting back on soda and limiting processed foods as much as possible. Staying active: In addition to taking your weight loss medication, aim for at least 150 minutes of moderate-intensity physical activity each week for optimal results.    Please follow up if your symptoms worsen or fail to improve.    Please continue to a heart-healthy diet and increase your physical activities. Try to exercise for at least five days a week.    It was a pleasure to see you and I look forward to continuing to work together on your health and well-being. Please do not hesitate to call the office if you need care or have questions about your care.  In case of emergency, please visit the Emergency Department for urgent care, or contact our clinic at [phone] to schedule an appointment. We're here to help you!   Have a wonderful day and week. With Gratitude, Anyae Griffith MSN, FNP-BC

## 2023-10-28 NOTE — Assessment & Plan Note (Signed)
 Patient educated on CDC recommendation for the vaccine. Verbal consent was obtained from the patient, vaccine administered by nurse, no sign of adverse reactions noted at this time. Patient education on arm soreness and use of tylenol or ibuprofen for this patient  was discussed. Patient educated on the signs and symptoms of adverse effect and advise to contact the office if they occur.

## 2023-10-28 NOTE — Progress Notes (Signed)
 Established Patient Office Visit  Subjective:  Patient ID: Kara Gentry, female    DOB: November 17, 1975  Age: 48 y.o. MRN: [id number]  CC:  Chief Complaint  Patient presents with   Obesity    HPI Cherly Erno Ricketts is a 48 y.o. female with past medical history of migraines, vitamin D  deficiency, and obesity presents for f/u of obesity. For the details of today's visit, please refer to the assessment and plan.     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   PONV (postoperative nausea and vomiting)    S/P ORIF (open reduction internal fixation) fracture 05/09/17 Ankle  Right     Seasonal allergies    Vitamin D  deficiency 05/23/2016   Take 5000 IU vitamin D3 daily    Past Surgical History:  Procedure Laterality Date   APPLICATION OF WOUND VAC  01/03/2012   Procedure: APPLICATION OF WOUND VAC;  Surgeon: Norleen LULLA Server, MD;  Location: [facility];  Service: Gynecology;  Laterality: N/A;   APPLICATION OF WOUND VAC  01/05/2012   Procedure: APPLICATION OF WOUND VAC;  Surgeon: Norleen LULLA Server, MD;  Location: [facility];  Service: Gynecology;  Laterality: N/A;   APPLICATION OF WOUND VAC  01/07/2012   Procedure: APPLICATION OF WOUND VAC;  Surgeon: Norleen LULLA Server, MD;  Location: [facility];  Service: Gynecology;;   APPLICATION OF WOUND VAC  01/11/2012   Procedure: APPLICATION OF WOUND VAC;  Surgeon: Norleen LULLA Server, MD;  Location: [facility];  Service: Gynecology;  Laterality: N/A;  Fasicial Closure   CESAREAN SECTION  12/22/2011   Procedure: CESAREAN SECTION;  Surgeon: Glenys GORMAN Birk, MD;  Location: [facility];  Service: Obstetrics;  Laterality: N/A;  Primary cesarean section with delivery of Baby A girl at 70. Baby B girl at 32.   CESAREAN SECTION N/A    Phreesia  07/26/2019   DILITATION & CURRETTAGE/HYSTROSCOPY WITH NOVASURE ABLATION N/A 09/04/2016   Procedure: HYSTEROSCOPY WITH NOVASURE ABLATION;  Surgeon: Server Norleen LULLA, MD;  Location: [facility];  Service: Gynecology;  Laterality: N/A;   ECTOPIC PREGNANCY SURGERY     FRACTURE SURGERY N/A    Phreesia 07/26/2019   INCISION AND DRAINAGE OF WOUND  01/07/2012   Procedure: IRRIGATION AND DEBRIDEMENT WOUND;  Surgeon: Norleen LULLA Server, MD;  Location: [facility];  Service: Gynecology;;   INSERTION OF MESH  01/03/2012   Procedure: INSERTION OF MESH;  Surgeon: Norleen LULLA Server, MD;  Location: [facility];  Service: Gynecology;  Laterality: N/A;   MANDIBLE SURGERY Right cancer   Cancerous mass   NASAL SINUS SURGERY     ORIF ANKLE FRACTURE Right 05/09/2017   Procedure: OPEN REDUCTION INTERNAL FIXATION (ORIF) ANKLE FRACTURE;  Surgeon: Margrette Taft BRAVO, MD;  Location: [facility];  Service: Orthopedics;  Laterality: Right;   SECONDARY CLOSURE OF WOUND  01/22/2012   Procedure: SECONDARY CLOSURE OF WOUND;  Surgeon: Norleen LULLA Server, MD;  Location: [facility];  Service: Gynecology;  Laterality: N/A;   WOUND EXPLORATION  01/03/2012   Necrotizing fascitis at C section incision   WOUND EXPLORATION  01/05/2012   Procedure: WOUND EXPLORATION;  Surgeon: Norleen LULLA Server, MD;  Location: [facility];  Service: Gynecology;  Laterality: N/A;  Re-Exploration of Wound Abscess with Debridement   WOUND EXPLORATION  01/07/2012   Procedure: WOUND EXPLORATION;  Surgeon: Norleen LULLA Server, MD;  Location: [facility];  Service: Gynecology;;   WOUND EXPLORATION  01/11/2012   Procedure: WOUND EXPLORATION;  Surgeon: Norleen LULLA Server, MD;  Location: [facility];  Service: Gynecology;  Laterality: N/A;  Debridment of Wound Abscess   WOUND EXPLORATION  01/22/2012   Procedure: WOUND EXPLORATION;  Surgeon: Norleen LULLA Server, MD;  Location: [facility];  Service: Gynecology;  Laterality: N/A;  Delayed Secondary Wound Closure   WRIST SURGERY Left    Cyst removal    Family History   Problem Relation Age of Onset   Cancer Maternal Grandfather        prostate   Diabetes Mother    Arthritis Mother    Cancer Paternal Uncle    Cancer Maternal Uncle        lung   Diabetes Maternal Aunt    Arthritis Maternal Aunt     Social History   Socioeconomic History   Marital status: Married    Spouse name: Not on file   Number of children: Not on file   Years of education: masters   Highest education level: Not on file  Occupational History   Occupation: Magazine features editor: [employer]  Tobacco Use   Smoking status: Never   Smokeless tobacco: Never  Vaping Use   Vaping status: Never Used  Substance and Sexual Activity   Alcohol use: No   Drug use: No   Sexual activity: Yes    Birth control/protection: Surgical    Comment: ablation  Other Topics Concern   Not on file  Social History Narrative   Not on file   Social Drivers of Health   Financial Resource Strain: Not on file  Food Insecurity: Not on file  Transportation Needs: Not on file  Physical Activity: Not on file  Stress: Not on file  Social Connections: Unknown (07/04/2021)   Received from [facility]   Social Network    Social Network: Not on file  Intimate Partner Violence: Unknown (05/26/2021)   Received from [facility]   HITS    Physically Hurt: Not on file    Insult or Talk Down To: Not on file    Threaten Physical Harm: Not on file    Scream or Curse: Not on file    Outpatient Medications Prior to Visit  Medication Sig Dispense Refill   aspirin-acetaminophen -caffeine (EXCEDRIN MIGRAINE) 250-250-65 MG tablet Take 1-2 tablets by mouth daily as needed for headache.     cyclobenzaprine  (FLEXERIL ) 5 MG tablet Take 5 mg by mouth 3 (three) times daily as needed for muscle spasms.     hydrOXYzine  (VISTARIL ) 25 MG capsule Take 1 capsule (25 mg total) by mouth at bedtime as needed. 30 capsule 1   meloxicam  (MOBIC ) 7.5 MG tablet Take 1 tablet (7.5 mg total) by mouth daily. 30 tablet 5    Multiple Vitamins-Minerals (ONE-A-DAY WOMENS PO) Take by mouth.     triamcinolone  cream (KENALOG ) 0.1 % Apply 1 Application topically 2 (two) times daily. 28.4 g 1   VITAMIN D  PO Take by mouth.     phentermine  37.5 MG capsule Take 1 capsule (37.5 mg total) by mouth every morning. 30 capsule 0   gabapentin  (NEURONTIN ) 100 MG capsule Take 1 capsule (100 mg total) by mouth at bedtime. (Patient not taking: Reported on 10/28/2023) 60 capsule 2   Vitamin  D, Ergocalciferol , (DRISDOL ) 1.25 MG (50000 UNIT) CAPS capsule Take 1 capsule (50,000 Units total) by mouth every 7 (seven) days. 27 capsule 1   No facility-administered medications prior to visit.    No Known Allergies  ROS Review of Systems  Constitutional:  Negative for chills and fever.  Eyes:  Negative for visual disturbance.  Respiratory:  Negative for chest tightness and shortness of breath.   Neurological:  Negative for dizziness and headaches.      Objective:    Physical Exam HENT:     Head: Normocephalic.     Mouth/Throat:     Mouth: Mucous membranes are moist.  Cardiovascular:     Rate and Rhythm: Normal rate.     Heart sounds: Normal heart sounds.  Pulmonary:     Effort: Pulmonary effort is normal.     Breath sounds: Normal breath sounds.  Neurological:     Mental Status: She is alert.     BP 129/85   Pulse 69   Ht 5' 2 (1.575 m)   Wt 184 lb (83.5 kg)   SpO2 96%   BMI 33.65 kg/m  Wt Readings from Last 3 Encounters:  10/28/23 184 lb (83.5 kg)  08/19/23 193 lb 0.6 oz (87.6 kg)  06/20/23 201 lb 1.9 oz (91.2 kg)    Lab Results  Component Value Date   TSH 0.891 06/20/2023   Lab Results  Component Value Date   WBC 5.5 06/20/2023   HGB 12.5 06/20/2023   HCT 39.1 06/20/2023   MCV 86 06/20/2023   PLT 275 06/20/2023   Lab Results  Component Value Date   NA 138 06/20/2023   K 4.2 06/20/2023   CO2 21 06/20/2023   GLUCOSE 100 (H) 06/20/2023   BUN 18 06/20/2023   CREATININE 0.72 06/20/2023   BILITOT  0.6 06/20/2023   ALKPHOS 56 06/20/2023   AST 12 06/20/2023   ALT 14 06/20/2023   PROT 7.2 06/20/2023   ALBUMIN 4.2 06/20/2023   CALCIUM  9.3 06/20/2023   ANIONGAP 11 05/08/2017   EGFR 103 06/20/2023   Lab Results  Component Value Date   CHOL 182 06/20/2023   Lab Results  Component Value Date   HDL 46 06/20/2023   Lab Results  Component Value Date   LDLCALC 127 (H) 06/20/2023   Lab Results  Component Value Date   TRIG 47 06/20/2023   Lab Results  Component Value Date   CHOLHDL 4.0 06/20/2023   Lab Results  Component Value Date   HGBA1C 5.9 (H) 06/20/2023      Assessment & Plan:  Obesity, Class II, BMI 35-39.9, 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 Assessment & Plan: The patient is doing well on phentermine  therapy and reports no adverse effects from the medication. She has lost approximately 9 pounds since initiation of treatment. She was encouraged to continue her current regimen and to implement lifestyle modifications, including adherence to a heart-healthy diet and increased physical activity, for optimal results. The patient verbalized understanding, and a refill for phentermine  was sent to her pharmacy.  Wt Readings from Last 3 Encounters:  10/28/23 184 lb (83.5 kg)  08/19/23 193 lb 0.6 oz (87.6 kg)  06/20/23 201 lb 1.9 oz (91.2 kg)     Orders: -     Phentermine  HCl; Take 1 capsule (37.5 mg total) by mouth every morning.  Dispense: 30 capsule; Refill: 0  Encounter for immunization Assessment & Plan: Patient educated on CDC recommendation for the vaccine. Verbal consent was obtained from the patient, vaccine  administered by nurse, no sign of adverse reactions noted at this time. Patient education on arm soreness and use of tylenol  or ibuprofen  for this patient  was discussed. Patient educated on the signs and symptoms of adverse effect and advise to contact the office if they occur.   Orders: -     Flu vaccine trivalent PF, 6mos and older(Flulaval,Afluria,Fluarix,Fluzone)  Note:  This chart has been completed using Engineer, civil (consulting) software, and while attempts have been made to ensure accuracy, certain words and phrases may not be transcribed as intended.    Follow-up: No follow-ups on file.   Latresa Gasser, FNP

## 2023-10-28 NOTE — Assessment & Plan Note (Signed)
 The patient is doing well on phentermine  therapy and reports no adverse effects from the medication. She has lost approximately 9 pounds since initiation of treatment. She was encouraged to continue her current regimen and to implement lifestyle modifications, including adherence to a heart-healthy diet and increased physical activity, for optimal results. The patient verbalized understanding, and a refill for phentermine  was sent to her pharmacy.  Wt Readings from Last 3 Encounters:  10/28/23 184 lb (83.5 kg)  08/19/23 193 lb 0.6 oz (87.6 kg)  06/20/23 201 lb 1.9 oz (91.2 kg)

## 2023-11-26 ENCOUNTER — Other Ambulatory Visit: Payer: Self-pay | Admitting: Family Medicine

## 2023-11-26 DIAGNOSIS — E66812 Obesity, class 2: Secondary | ICD-10-CM

## 2024-01-07 ENCOUNTER — Other Ambulatory Visit: Payer: Self-pay | Admitting: Family Medicine

## 2024-01-07 DIAGNOSIS — E66812 Obesity, class 2: Secondary | ICD-10-CM

## 2024-01-09 ENCOUNTER — Other Ambulatory Visit: Payer: Self-pay | Admitting: Family Medicine

## 2024-02-25 ENCOUNTER — Ambulatory Visit: Admitting: Family Medicine

## 2024-03-02 ENCOUNTER — Telehealth: Payer: Self-pay | Admitting: Family Medicine

## 2024-03-02 DIAGNOSIS — R7301 Impaired fasting glucose: Secondary | ICD-10-CM

## 2024-03-02 DIAGNOSIS — G8929 Other chronic pain: Secondary | ICD-10-CM | POA: Diagnosis not present

## 2024-03-02 DIAGNOSIS — E038 Other specified hypothyroidism: Secondary | ICD-10-CM

## 2024-03-02 DIAGNOSIS — E66812 Obesity, class 2: Secondary | ICD-10-CM

## 2024-03-02 DIAGNOSIS — E782 Mixed hyperlipidemia: Secondary | ICD-10-CM | POA: Diagnosis not present

## 2024-03-02 DIAGNOSIS — M25511 Pain in right shoulder: Secondary | ICD-10-CM

## 2024-03-02 DIAGNOSIS — E559 Vitamin D deficiency, unspecified: Secondary | ICD-10-CM

## 2024-03-02 MED ORDER — PHENTERMINE HCL 37.5 MG PO CAPS: 37.5000 mg | ORAL_CAPSULE | ORAL | 0 refills | Status: AC

## 2024-03-02 MED ORDER — CYCLOBENZAPRINE HCL 5 MG PO TABS: 5.0000 mg | ORAL_TABLET | Freq: Three times a day (TID) | ORAL | 5 refills | Status: AC | PRN

## 2024-03-02 MED ORDER — MELOXICAM 10 MG PO CAPS: 10.0000 mg | ORAL_CAPSULE | Freq: Every day | ORAL | 2 refills | Status: AC

## 2024-03-02 NOTE — Assessment & Plan Note (Signed)
 Continue taking phentermine  37.5 mg daily. Please monitor for potential side effects, including elevated blood pressure, palpitations or rapid heart rate, insomnia, dry mouth, anxiety or restlessness, headaches, and constipation. Contact the clinic immediately if you experience severe symptoms such as chest pain, shortness of breath, severe dizziness, or an irregular heartbeat. For optimal results with weight loss, I recommend:  Decreasing portion sizes. Reducing sugar, sodium, and carbohydrate intake, and limiting saturated fats in your diet. Increasing your fiber intake by incorporating more whole grains, fruits, and vegetables. Setting healthy goals and focusing on lowering carbs, sugar, and fat. Increasing the variety of fruits and vegetables in your diet. Reducing soda consumption and limiting processed foods. In addition to taking your weight loss medication, engage in moderate-intensity physical activity for at least 150 minutes per week for the best results.

## 2024-03-02 NOTE — Assessment & Plan Note (Signed)
 Reviewed prior imaging, which was normal. Symptoms likely musculoskeletal in nature. Continue current pain management regimen as tolerated. Avoid overuse of analgesics. Encourage activity modification and avoidance of sleeping directly on the right shoulder. Recommend use of a supportive pillow or positional changes during sleep. Encourage gentle range-of-motion and stretching exercises. Advise application of ice or heat as needed for pain relief. Instruct the patient to monitor for worsening pain, decreased range of motion, weakness, numbness, or tingling.  Follow-up: Follow up in clinic if symptoms persist or worsen despite conservative management.

## 2024-03-02 NOTE — Progress Notes (Signed)
 "  Virtual Visit via Video Note  I connected with Kara Gentry on 03/02/2024 at  8:00 AM EST by a video enabled telemedicine application and verified that I am speaking with the correct person using two identifiers.  Patient Location: Home Provider Location: Home Office  I discussed the limitations, risks, security, and privacy concerns of performing an evaluation and management service by video and the availability of in person appointments. I also discussed with the patient that there may be a patient responsible charge related to this service. The patient expressed understanding and agreed to proceed.  Subjective: PCP: Kara Kara PEDLAR, FNP  Chief Complaint  Patient presents with   Medical Management of Chronic Issues    Follow up   HPI The patient presents today with complaints of chronic right shoulder pain. She reports pain rated at 7/10 in severity, which is worse at night and interferes with sleeping on the right shoulder. She notes that the pain tends to improve during the daytime and with movement. Imaging of the right shoulder completed on 12/03/2022 showed a normal glenohumeral joint without osteophyte formation or joint space narrowing, with a normal humeral head, visible humerus, and chest wall--overall a normal shoulder study.   ROS: Per HPI Current Medications[1]  Observations/Objective: There were no vitals filed for this visit. Physical Exam Patient is well-developed, well-nourished in no acute distress.  Resting comfortably at home.  Head is normocephalic, atraumatic.  No labored breathing.  Speech is clear and coherent with logical content.  Patient is alert and oriented at baseline.   Assessment and Plan: Chronic right shoulder pain Assessment & Plan: Reviewed prior imaging, which was normal. Symptoms likely musculoskeletal in nature. Continue current pain management regimen as tolerated. Avoid overuse of analgesics. Encourage activity modification and  avoidance of sleeping directly on the right shoulder. Recommend use of a supportive pillow or positional changes during sleep. Encourage gentle range-of-motion and stretching exercises. Advise application of ice or heat as needed for pain relief. Instruct the patient to monitor for worsening pain, decreased range of motion, weakness, numbness, or tingling.  Follow-up: Follow up in clinic if symptoms persist or worsen despite conservative management.   Orders: -     Meloxicam ; Take 10 mg by mouth daily.  Dispense: 30 capsule; Refill: 2 -     Cyclobenzaprine  HCl; Take 1 tablet (5 mg total) by mouth 3 (three) times daily as needed for muscle spasms.  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 Please monitor for potential side effects, including elevated blood pressure, palpitations or rapid heart rate, insomnia, dry mouth, anxiety or restlessness, headaches, and constipation. Contact the clinic immediately if you experience severe symptoms such as chest pain, shortness of breath, severe dizziness, or an irregular heartbeat. For optimal results with weight loss, I recommend:  Decreasing portion sizes. Reducing sugar, sodium, and carbohydrate intake, and limiting saturated fats in your diet. Increasing your fiber intake by incorporating more whole grains, fruits, and vegetables. Setting healthy goals and focusing on lowering carbs, sugar, and fat. Increasing the variety of fruits and vegetables in your diet. Reducing soda consumption and limiting processed foods. In addition to taking your weight loss medication, engage in moderate-intensity physical activity for at least 150 minutes per week for the best results.   Orders: -     Phentermine  HCl; Take 1 capsule (37.5 mg total) by mouth every morning.  Dispense: 30 capsule; Refill:  0  IFG (impaired fasting glucose) -     Hemoglobin  A1c  Vitamin D  deficiency -     VITAMIN D  25 Hydroxy (Vit-D Deficiency, Fractures)  TSH (thyroid-stimulating hormone deficiency) -     TSH + free T4  Mixed hyperlipidemia -     Lipid panel -     CMP14+EGFR -     CBC with Differential/Platelet   Note: This chart has been completed using [employer] (consulting) software, and while attempts have been made to ensure accuracy, certain words and phrases may not be transcribed as intended.   Follow Up Instructions: Return in about 5 months (around 07/31/2024).   I discussed the assessment and treatment plan with the patient. The patient was provided an opportunity to ask questions, and all were answered. The patient agreed with the plan and demonstrated an understanding of the instructions.   The patient was advised to call back or seek an in-person evaluation if the symptoms worsen or if the condition fails to improve as anticipated.  The above assessment and management plan was discussed with the patient. The patient verbalized understanding of and has agreed to the management plan.   Kara JENEANE Gerlach, FNP     [1]  Current Outpatient Medications:    Meloxicam  10 MG CAPS, Take 10 mg by mouth daily., Disp: 30 capsule, Rfl: 2   aspirin-acetaminophen -caffeine (EXCEDRIN MIGRAINE) 250-250-65 MG tablet, Take 1-2 tablets by mouth daily as needed for headache., Disp: , Rfl:    cyclobenzaprine  (FLEXERIL ) 5 MG tablet, Take 1 tablet (5 mg total) by mouth 3 (three) times daily as needed for muscle spasms., Disp: 30 tablet, Rfl: 5   gabapentin  (NEURONTIN ) 100 MG capsule, Take 1 capsule (100 mg total) by mouth at bedtime. (Patient not taking: Reported on 10/28/2023), Disp: 60 capsule, Rfl: 2   hydrOXYzine  (VISTARIL ) 25 MG capsule, Take 1 capsule (25 mg total) by mouth at bedtime as needed., Disp: 30 capsule, Rfl: 1   Multiple Vitamins-Minerals (ONE-A-DAY WOMENS PO), Take by mouth., Disp: , Rfl:    phentermine  37.5 MG capsule, Take 1 capsule (37.5 mg  total) by mouth every morning., Disp: 30 capsule, Rfl: 0   triamcinolone  cream (KENALOG ) 0.1 %, Apply 1 Application topically 2 (two) times daily., Disp: 28.4 g, Rfl: 1   VITAMIN D  PO, Take by mouth., Disp: , Rfl:    Vitamin D , Ergocalciferol , (DRISDOL ) 1.25 MG (50000 UNIT) CAPS capsule, Take 1 capsule (50,000 Units total) by mouth every 7 (seven) days., Disp: 27 capsule, Rfl: 1  "

## 2024-03-05 LAB — CBC WITH DIFFERENTIAL/PLATELET
Basophils Absolute: 0 x10E3/uL (ref 0.0–0.2)
Basos: 1 %
EOS (ABSOLUTE): 0 x10E3/uL (ref 0.0–0.4)
Eos: 1 %
Hematocrit: 41 % (ref 34.0–46.6)
Hemoglobin: 13.1 g/dL (ref 11.1–15.9)
Immature Grans (Abs): 0 x10E3/uL (ref 0.0–0.1)
Immature Granulocytes: 0 %
Lymphocytes Absolute: 1.8 x10E3/uL (ref 0.7–3.1)
Lymphs: 36 %
MCH: 28.1 pg (ref 26.6–33.0)
MCHC: 32 g/dL (ref 31.5–35.7)
MCV: 88 fL (ref 79–97)
Monocytes Absolute: 0.3 x10E3/uL (ref 0.1–0.9)
Monocytes: 6 %
Neutrophils Absolute: 3 x10E3/uL (ref 1.4–7.0)
Neutrophils: 56 %
Platelets: 282 x10E3/uL (ref 150–450)
RBC: 4.66 x10E6/uL (ref 3.77–5.28)
RDW: 13 % (ref 11.7–15.4)
WBC: 5.2 x10E3/uL (ref 3.4–10.8)

## 2024-03-05 LAB — CMP14+EGFR
ALT: 10 IU/L (ref 0–32)
AST: 12 IU/L (ref 0–40)
Albumin: 4.4 g/dL (ref 3.9–4.9)
Alkaline Phosphatase: 61 IU/L (ref 41–116)
BUN/Creatinine Ratio: 19 (ref 9–23)
BUN: 14 mg/dL (ref 6–24)
Bilirubin Total: 0.9 mg/dL (ref 0.0–1.2)
CO2: 22 mmol/L (ref 20–29)
Calcium: 9.4 mg/dL (ref 8.7–10.2)
Chloride: 101 mmol/L (ref 96–106)
Creatinine, Ser: 0.73 mg/dL (ref 0.57–1.00)
Globulin, Total: 3 g/dL (ref 1.5–4.5)
Glucose: 99 mg/dL (ref 70–99)
Potassium: 4.1 mmol/L (ref 3.5–5.2)
Sodium: 137 mmol/L (ref 134–144)
Total Protein: 7.4 g/dL (ref 6.0–8.5)
eGFR: 101 mL/min/1.73

## 2024-03-05 LAB — TSH+FREE T4
Free T4: 1.31 ng/dL (ref 0.82–1.77)
TSH: 1.24 u[IU]/mL (ref 0.450–4.500)

## 2024-03-05 LAB — LIPID PANEL
Chol/HDL Ratio: 3.1 ratio (ref 0.0–4.4)
Cholesterol, Total: 198 mg/dL (ref 100–199)
HDL: 63 mg/dL
LDL Chol Calc (NIH): 127 mg/dL — ABNORMAL HIGH (ref 0–99)
Triglycerides: 42 mg/dL (ref 0–149)
VLDL Cholesterol Cal: 8 mg/dL (ref 5–40)

## 2024-03-05 LAB — HEMOGLOBIN A1C
Est. average glucose Bld gHb Est-mCnc: 120 mg/dL
Hgb A1c MFr Bld: 5.8 % — ABNORMAL HIGH (ref 4.8–5.6)

## 2024-03-05 LAB — VITAMIN D 25 HYDROXY (VIT D DEFICIENCY, FRACTURES): Vit D, 25-Hydroxy: 22 ng/mL — ABNORMAL LOW (ref 30.0–100.0)

## 2024-03-09 ENCOUNTER — Other Ambulatory Visit: Payer: Self-pay | Admitting: Family Medicine

## 2024-03-09 ENCOUNTER — Ambulatory Visit: Payer: Self-pay | Admitting: Family Medicine

## 2024-03-09 DIAGNOSIS — E559 Vitamin D deficiency, unspecified: Secondary | ICD-10-CM

## 2024-03-09 MED ORDER — VITAMIN D (ERGOCALCIFEROL) 1.25 MG (50000 UNIT) PO CAPS: 50000.0000 [IU] | ORAL_CAPSULE | ORAL | 1 refills | Status: AC

## 2024-03-09 NOTE — Progress Notes (Signed)
 Please inform the patient that a refill of her weekly vitamin D  supplement has been sent to her pharmacy, as her vitamin D  levels are low. I recommend that she continue implementing lifestyle changes, including following a heart-healthy diet, increasing physical activity, decreasing intake of high-sugar foods, and reducing greasy, fatty, and starchy foods.

## 2024-08-31 ENCOUNTER — Ambulatory Visit: Payer: Self-pay
